# Patient Record
Sex: Male | Born: 1965 | Race: Black or African American | Hispanic: No | Smoking: Never smoker
Health system: Southern US, Community
[De-identification: ages and names within clinical notes are randomized; demographics above are authoritative.]

## PROBLEM LIST (undated history)

## (undated) DIAGNOSIS — I10 Essential (primary) hypertension: Secondary | ICD-10-CM

## (undated) DIAGNOSIS — L309 Dermatitis, unspecified: Secondary | ICD-10-CM

## (undated) DIAGNOSIS — W3400XA Accidental discharge from unspecified firearms or gun, initial encounter: Secondary | ICD-10-CM

## (undated) DIAGNOSIS — T7840XA Allergy, unspecified, initial encounter: Secondary | ICD-10-CM

## (undated) DIAGNOSIS — Z5189 Encounter for other specified aftercare: Secondary | ICD-10-CM

## (undated) HISTORY — PX: TONSILLECTOMY: SUR1361

## (undated) HISTORY — PX: MYRINGOTOMY: SUR874

## (undated) HISTORY — DX: Allergy, unspecified, initial encounter: T78.40XA

## (undated) HISTORY — PX: UPPER GASTROINTESTINAL ENDOSCOPY: SHX188

## (undated) HISTORY — DX: Encounter for other specified aftercare: Z51.89

## (undated) HISTORY — PX: COLOSTOMY: SHX63

## (undated) HISTORY — DX: Dermatitis, unspecified: L30.9

## (undated) HISTORY — DX: Essential (primary) hypertension: I10

## (undated) HISTORY — DX: Accidental discharge from unspecified firearms or gun, initial encounter: W34.00XA

---

## 2004-11-21 ENCOUNTER — Ambulatory Visit: Payer: Self-pay | Admitting: Internal Medicine

## 2004-12-12 ENCOUNTER — Ambulatory Visit: Payer: Self-pay | Admitting: Internal Medicine

## 2005-01-06 ENCOUNTER — Inpatient Hospital Stay (HOSPITAL_COMMUNITY): Admission: EM | Admit: 2005-01-06 | Discharge: 2005-01-17 | Payer: Self-pay

## 2005-01-30 ENCOUNTER — Emergency Department (HOSPITAL_COMMUNITY): Admission: EM | Admit: 2005-01-30 | Discharge: 2005-01-30 | Payer: Self-pay | Admitting: *Deleted

## 2005-02-02 ENCOUNTER — Emergency Department (HOSPITAL_COMMUNITY): Admission: EM | Admit: 2005-02-02 | Discharge: 2005-02-03 | Payer: Self-pay | Admitting: Emergency Medicine

## 2005-02-10 ENCOUNTER — Ambulatory Visit: Payer: Self-pay | Admitting: Internal Medicine

## 2005-02-13 ENCOUNTER — Ambulatory Visit (HOSPITAL_COMMUNITY): Admission: RE | Admit: 2005-02-13 | Discharge: 2005-02-13 | Payer: Self-pay | Admitting: Internal Medicine

## 2005-02-17 ENCOUNTER — Ambulatory Visit (HOSPITAL_COMMUNITY): Admission: RE | Admit: 2005-02-17 | Discharge: 2005-02-17 | Payer: Self-pay | Admitting: *Deleted

## 2005-02-17 ENCOUNTER — Ambulatory Visit (HOSPITAL_BASED_OUTPATIENT_CLINIC_OR_DEPARTMENT_OTHER): Admission: RE | Admit: 2005-02-17 | Discharge: 2005-02-17 | Payer: Self-pay | Admitting: *Deleted

## 2005-03-13 ENCOUNTER — Ambulatory Visit: Payer: Self-pay | Admitting: Internal Medicine

## 2005-03-17 ENCOUNTER — Encounter: Admission: RE | Admit: 2005-03-17 | Discharge: 2005-04-13 | Payer: Self-pay | Admitting: Internal Medicine

## 2005-04-21 ENCOUNTER — Ambulatory Visit: Payer: Self-pay | Admitting: Internal Medicine

## 2005-05-18 ENCOUNTER — Inpatient Hospital Stay (HOSPITAL_COMMUNITY): Admission: RE | Admit: 2005-05-18 | Discharge: 2005-05-21 | Payer: Self-pay | Admitting: General Surgery

## 2005-07-25 ENCOUNTER — Ambulatory Visit: Payer: Self-pay | Admitting: Internal Medicine

## 2005-09-13 ENCOUNTER — Ambulatory Visit: Payer: Self-pay | Admitting: Internal Medicine

## 2005-10-13 ENCOUNTER — Ambulatory Visit (HOSPITAL_COMMUNITY): Admission: RE | Admit: 2005-10-13 | Discharge: 2005-10-13 | Payer: Self-pay | Admitting: General Surgery

## 2005-11-02 ENCOUNTER — Inpatient Hospital Stay (HOSPITAL_COMMUNITY): Admission: RE | Admit: 2005-11-02 | Discharge: 2005-11-05 | Payer: Self-pay | Admitting: General Surgery

## 2005-11-23 ENCOUNTER — Ambulatory Visit: Payer: Self-pay | Admitting: Internal Medicine

## 2005-12-04 ENCOUNTER — Encounter: Admission: RE | Admit: 2005-12-04 | Discharge: 2005-12-04 | Payer: Self-pay | Admitting: General Surgery

## 2005-12-19 ENCOUNTER — Ambulatory Visit: Payer: Self-pay | Admitting: Internal Medicine

## 2006-03-20 ENCOUNTER — Ambulatory Visit: Payer: Self-pay | Admitting: Internal Medicine

## 2006-08-07 ENCOUNTER — Encounter: Admission: RE | Admit: 2006-08-07 | Discharge: 2006-08-07 | Payer: Self-pay | Admitting: General Surgery

## 2006-08-08 ENCOUNTER — Inpatient Hospital Stay (HOSPITAL_COMMUNITY): Admission: AD | Admit: 2006-08-08 | Discharge: 2006-08-12 | Payer: Self-pay | Admitting: General Surgery

## 2006-08-09 ENCOUNTER — Encounter (INDEPENDENT_AMBULATORY_CARE_PROVIDER_SITE_OTHER): Payer: Self-pay | Admitting: Specialist

## 2006-12-10 ENCOUNTER — Ambulatory Visit: Payer: Self-pay | Admitting: Internal Medicine

## 2007-03-23 ENCOUNTER — Encounter: Payer: Self-pay | Admitting: Internal Medicine

## 2007-03-23 DIAGNOSIS — E1165 Type 2 diabetes mellitus with hyperglycemia: Secondary | ICD-10-CM

## 2007-03-23 DIAGNOSIS — K432 Incisional hernia without obstruction or gangrene: Secondary | ICD-10-CM | POA: Insufficient documentation

## 2007-03-23 DIAGNOSIS — I10 Essential (primary) hypertension: Secondary | ICD-10-CM | POA: Insufficient documentation

## 2007-03-23 DIAGNOSIS — E669 Obesity, unspecified: Secondary | ICD-10-CM | POA: Insufficient documentation

## 2007-03-23 DIAGNOSIS — E119 Type 2 diabetes mellitus without complications: Secondary | ICD-10-CM | POA: Insufficient documentation

## 2007-04-24 ENCOUNTER — Ambulatory Visit: Payer: Self-pay | Admitting: Internal Medicine

## 2007-04-24 LAB — CONVERTED CEMR LAB
Basophils Absolute: 0 10*3/uL (ref 0.0–0.1)
Bilirubin, Direct: 0.1 mg/dL (ref 0.0–0.3)
Eosinophils Absolute: 0 10*3/uL (ref 0.0–0.6)
Eosinophils Relative: 0.9 % (ref 0.0–5.0)
GFR calc Af Amer: 96 mL/min
Glucose, Bld: 122 mg/dL — ABNORMAL HIGH (ref 70–99)
HCT: 40.5 % (ref 39.0–52.0)
Hgb A1c MFr Bld: 6.4 % — ABNORMAL HIGH (ref 4.6–6.0)
Lymphocytes Relative: 52.9 % — ABNORMAL HIGH (ref 12.0–46.0)
MCHC: 34 g/dL (ref 30.0–36.0)
MCV: 93.5 fL (ref 78.0–100.0)
Monocytes Absolute: 0.5 10*3/uL (ref 0.2–0.7)
Neutro Abs: 1.8 10*3/uL (ref 1.4–7.7)
Neutrophils Relative %: 36.3 % — ABNORMAL LOW (ref 43.0–77.0)
Potassium: 4.4 meq/L (ref 3.5–5.1)
RBC: 4.33 M/uL (ref 4.22–5.81)
Sodium: 145 meq/L (ref 135–145)
Total Protein: 8.4 g/dL — ABNORMAL HIGH (ref 6.0–8.3)
WBC: 5 10*3/uL (ref 4.5–10.5)

## 2007-11-11 ENCOUNTER — Telehealth: Payer: Self-pay | Admitting: Internal Medicine

## 2008-03-25 ENCOUNTER — Telehealth: Payer: Self-pay | Admitting: Internal Medicine

## 2008-03-26 ENCOUNTER — Ambulatory Visit: Payer: Self-pay | Admitting: Internal Medicine

## 2008-03-26 DIAGNOSIS — J302 Other seasonal allergic rhinitis: Secondary | ICD-10-CM | POA: Insufficient documentation

## 2008-03-26 DIAGNOSIS — J3089 Other allergic rhinitis: Secondary | ICD-10-CM

## 2008-05-14 ENCOUNTER — Telehealth (INDEPENDENT_AMBULATORY_CARE_PROVIDER_SITE_OTHER): Payer: Self-pay | Admitting: *Deleted

## 2008-05-29 ENCOUNTER — Ambulatory Visit: Payer: Self-pay | Admitting: Internal Medicine

## 2010-05-30 ENCOUNTER — Ambulatory Visit: Payer: Self-pay | Admitting: Internal Medicine

## 2010-05-30 DIAGNOSIS — M79609 Pain in unspecified limb: Secondary | ICD-10-CM | POA: Insufficient documentation

## 2010-05-30 DIAGNOSIS — R209 Unspecified disturbances of skin sensation: Secondary | ICD-10-CM | POA: Insufficient documentation

## 2010-06-01 LAB — CONVERTED CEMR LAB
AST: 28 units/L (ref 0–37)
Albumin: 4.5 g/dL (ref 3.5–5.2)
Alkaline Phosphatase: 48 units/L (ref 39–117)
Basophils Absolute: 0 10*3/uL (ref 0.0–0.1)
Bilirubin Urine: NEGATIVE
CO2: 33 meq/L — ABNORMAL HIGH (ref 19–32)
Calcium: 9.9 mg/dL (ref 8.4–10.5)
Creatinine, Ser: 1.2 mg/dL (ref 0.4–1.5)
Creatinine,U: 352.2 mg/dL
GFR calc non Af Amer: 89.4 mL/min (ref >60)
Glucose, Bld: 195 mg/dL — ABNORMAL HIGH (ref 70–99)
Ketones, ur: NEGATIVE mg/dL
Leukocytes, UA: NEGATIVE
Lymphocytes Relative: 55.8 % — ABNORMAL HIGH (ref 12.0–46.0)
MCHC: 34.6 g/dL (ref 30.0–36.0)
MCV: 93.3 fL (ref 78.0–100.0)
Microalb, Ur: 4.4 mg/dL — ABNORMAL HIGH (ref 0.0–1.9)
Monocytes Relative: 6.2 % (ref 3.0–12.0)
Platelets: 195 10*3/uL (ref 150.0–400.0)
RDW: 12.9 % (ref 11.5–14.6)
Sed Rate: 23 mm/hr — ABNORMAL HIGH (ref 0–22)
TSH: 0.58 microintl units/mL (ref 0.35–5.50)
Total Bilirubin: 1 mg/dL (ref 0.3–1.2)
Urine Glucose: NEGATIVE mg/dL
pH: 5 (ref 5.0–8.0)

## 2010-06-23 ENCOUNTER — Encounter: Payer: Self-pay | Admitting: Internal Medicine
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
## 2010-09-27 NOTE — Consult Note (Signed)
Summary: [facility]  [facility]   Imported By: Lennie Odor 07/13/2010 14:54:13  _____________________________________________________________________  External Attachment:    Type:   Image     Comment:   External Document

## 2010-11-03 ENCOUNTER — Ambulatory Visit: Payer: Self-pay | Admitting: Family Medicine

## 2010-11-04 ENCOUNTER — Other Ambulatory Visit: Payer: Self-pay | Admitting: Family Medicine

## 2010-11-04 ENCOUNTER — Ambulatory Visit (INDEPENDENT_AMBULATORY_CARE_PROVIDER_SITE_OTHER): Payer: 59 | Admitting: Family Medicine

## 2010-11-04 ENCOUNTER — Ambulatory Visit (HOSPITAL_BASED_OUTPATIENT_CLINIC_OR_DEPARTMENT_OTHER)
Admission: RE | Admit: 2010-11-04 | Discharge: 2010-11-04 | Disposition: A | Discharge status: Home / Self Care | Payer: 59 | Source: Ambulatory Visit | Attending: Family Medicine | Admitting: Family Medicine

## 2010-11-04 ENCOUNTER — Ambulatory Visit: Payer: Self-pay | Admitting: Family Medicine

## 2010-11-04 ENCOUNTER — Encounter: Payer: Self-pay | Admitting: Family Medicine

## 2010-11-04 DIAGNOSIS — M79609 Pain in unspecified limb: Secondary | ICD-10-CM

## 2010-11-04 DIAGNOSIS — M25569 Pain in unspecified knee: Secondary | ICD-10-CM | POA: Insufficient documentation

## 2010-11-10 ENCOUNTER — Ambulatory Visit: Payer: 59 | Admitting: Family Medicine

## 2010-11-11 ENCOUNTER — Encounter: Payer: Self-pay | Admitting: Family Medicine

## 2010-11-11 ENCOUNTER — Ambulatory Visit: Payer: 59 | Attending: Family Medicine | Admitting: Physical Therapy

## 2010-11-11 ENCOUNTER — Ambulatory Visit (INDEPENDENT_AMBULATORY_CARE_PROVIDER_SITE_OTHER): Payer: 59 | Admitting: Family Medicine

## 2010-11-11 DIAGNOSIS — M25569 Pain in unspecified knee: Secondary | ICD-10-CM

## 2010-11-11 DIAGNOSIS — M25559 Pain in unspecified hip: Secondary | ICD-10-CM | POA: Insufficient documentation

## 2010-11-11 DIAGNOSIS — IMO0001 Reserved for inherently not codable concepts without codable children: Secondary | ICD-10-CM | POA: Insufficient documentation
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
## 2010-11-15 NOTE — Assessment & Plan Note (Signed)
Summary: F/U/LP # CELL  [phone]   Primary Care Provider:  Tresa Garter MD   History of Present Illness: 45 yo M returns today for aspiration and injection of left knee  H/o football injury in 1986 with resultant left knee infection/effusion requiring surgery.   Had a second injury when in college but this did not require surgery Believes he had MRI at the time without evidence of ligamentous or meniscal injury but was very remote. Has swelling since then No true catching, locking, giving out Pain anterior and lateral in left knee. X-rays showed mild DJD Discussed with patient symptoms and exam most likely indicate DJD vs degen meniscal tear - would go ahead with aspiration and injection today following from his appointment last week.  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 1 By Mouth Once Daily For Blood Pressure 2)  Metformin Hcl 500 Mg Tabs (Metformin Hcl) .Marland Kitchen.. 1 By Mouth Two Times A Day For Diabetes 3)  Vitamin D3 1000 Unit  Tabs (Cholecalciferol) .Marland Kitchen.. 1 By Mouth Daily 4)  Multivitamin .... Once Daily  Allergies (verified): 1)  ! Diovan 2)  ! Ace Inhibitors 3)  ! * Shellfish  Physical Exam  General:  alert, well-developed, well-nourished, and well-hydrated, overweight Msk:  Exam unchanged   Impression & Recommendations:  Problem # 1:  KNEE PAIN, LEFT (ICD-719.46) Assessment Unchanged  DJD vs Degen meniscal tear.  After informed written consent patient was lying supine on the exam table and left knee was prepped with iodine and alcohol swab.  Utilizing a superolateral  approach, 3mL marcaine used for local.  Then left knee aspirated - 30 mL clear straw colored fluid aspirated.  Left knee was then injected with 3:1 marcaine:depomedrol.  Patient tolerated the procedure well without any immediate complications.  Orders: No Charge Patient Arrived (NCPA0) (NCPA0) Joint Aspirate / Injection, Large (20610)  Complete Medication List: 1)  Losartan Potassium 100 Mg Tabs (Losartan potassium) .Marland Kitchen.. 1 by mouth once daily for blood pressure 2)  Metformin Hcl 500 Mg Tabs (Metformin hcl) .Marland Kitchen.. 1 by mouth two times a day for diabetes 3)  Vitamin D3 1000 Unit Tabs (Cholecalciferol) .Marland Kitchen.. 1 by mouth daily 4)  Multivitamin  .... Once daily   Orders Added: 1)  No Charge Patient Arrived (NCPA0) [NCPA0] 2)  Joint Aspirate / Injection, Large [20610]

## 2010-11-25 ENCOUNTER — Ambulatory Visit: Payer: 59 | Admitting: Physical Therapy

## 2010-11-29 ENCOUNTER — Ambulatory Visit: Payer: 59 | Attending: Family Medicine | Admitting: Physical Therapy

## 2010-11-29 DIAGNOSIS — M25559 Pain in unspecified hip: Secondary | ICD-10-CM | POA: Insufficient documentation

## 2010-11-29 DIAGNOSIS — IMO0001 Reserved for inherently not codable concepts without codable children: Secondary | ICD-10-CM | POA: Insufficient documentation

## 2010-12-20 ENCOUNTER — Ambulatory Visit: Payer: 59 | Admitting: Physical Therapy

## 2010-12-21 ENCOUNTER — Other Ambulatory Visit (INDEPENDENT_AMBULATORY_CARE_PROVIDER_SITE_OTHER): Payer: 59

## 2010-12-21 ENCOUNTER — Encounter: Payer: Self-pay | Admitting: Internal Medicine

## 2010-12-21 ENCOUNTER — Ambulatory Visit (INDEPENDENT_AMBULATORY_CARE_PROVIDER_SITE_OTHER): Payer: 59 | Admitting: Internal Medicine

## 2010-12-21 DIAGNOSIS — E119 Type 2 diabetes mellitus without complications: Secondary | ICD-10-CM

## 2010-12-21 DIAGNOSIS — R29898 Other symptoms and signs involving the musculoskeletal system: Secondary | ICD-10-CM

## 2010-12-21 DIAGNOSIS — M79609 Pain in unspecified limb: Secondary | ICD-10-CM

## 2010-12-21 DIAGNOSIS — R209 Unspecified disturbances of skin sensation: Secondary | ICD-10-CM

## 2010-12-21 DIAGNOSIS — M79606 Pain in leg, unspecified: Secondary | ICD-10-CM

## 2010-12-21 DIAGNOSIS — I10 Essential (primary) hypertension: Secondary | ICD-10-CM

## 2010-12-21 DIAGNOSIS — R202 Paresthesia of skin: Secondary | ICD-10-CM

## 2010-12-21 DIAGNOSIS — S7410XA Injury of femoral nerve at hip and thigh level, unspecified leg, initial encounter: Secondary | ICD-10-CM

## 2010-12-21 LAB — COMPREHENSIVE METABOLIC PANEL
ALT: 16 U/L (ref 0–53)
CO2: 31 mEq/L (ref 19–32)
Calcium: 9.8 mg/dL (ref 8.4–10.5)
Chloride: 103 mEq/L (ref 96–112)
Creatinine, Ser: 1.1 mg/dL (ref 0.4–1.5)
GFR: 94.85 mL/min (ref >60.00)
Glucose, Bld: 151 mg/dL — ABNORMAL HIGH (ref 70–99)
Total Bilirubin: 0.8 mg/dL (ref 0.3–1.2)
Total Protein: 7.6 g/dL (ref 6.0–8.3)

## 2010-12-21 LAB — CBC WITH DIFFERENTIAL/PLATELET
Basophils Absolute: 0 10*3/uL (ref 0.0–0.1)
Eosinophils Relative: 0.5 % (ref 0.0–5.0)
HCT: 40.7 % (ref 39.0–52.0)
Hemoglobin: 13.9 g/dL (ref 13.0–17.0)
Lymphocytes Relative: 53.7 % — ABNORMAL HIGH (ref 12.0–46.0)
Lymphs Abs: 2.8 10*3/uL (ref 0.7–4.0)
Monocytes Relative: 8 % (ref 3.0–12.0)
Platelets: 191 10*3/uL (ref 150.0–400.0)
RDW: 12.8 % (ref 11.5–14.6)
WBC: 5.3 10*3/uL (ref 4.5–10.5)

## 2010-12-21 MED ORDER — MELOXICAM 15 MG PO TABS: 15.0000 mg | ORAL_TABLET | Freq: Every day | ORAL | Status: AC | PRN

## 2010-12-21 NOTE — Progress Notes (Signed)
  Subjective:    Patient ID: [name], male    DOB: 08/24/1966, 45 y.o.   MRN: [id number]  HPI   C/o R thigh pain and weakness in R leg - it would give out. It hurts at night and all day long. The pain is off and on.  The pain is in the front of R thigh.No LBP. He has a pulling sensation in R thigh. It "catches" - and it would go away in a couple seconds. It has gotten worse lately. He had seen Dr Pearletha Forge (Sports Med) and was started on PT.  Review of Systems  Constitutional: Negative for appetite change, fatigue and unexpected weight change.  HENT: Negative for nosebleeds, congestion, sore throat, sneezing, trouble swallowing and neck pain.   Eyes: Negative for itching and visual disturbance.  Respiratory: Negative for cough.   Cardiovascular: Negative for chest pain, palpitations and leg swelling.  Gastrointestinal: Negative for nausea, diarrhea, blood in stool and abdominal distention.  Genitourinary: Negative for frequency and hematuria.  Musculoskeletal: Positive for gait problem (R knee gives out). Negative for back pain and joint swelling.  Skin: Negative for rash.  Neurological: Negative for dizziness, tremors, speech difficulty and weakness.  Psychiatric/Behavioral: Negative for sleep disturbance, dysphoric mood and agitation. The patient is not nervous/anxious.        Objective:   Physical Exam  Constitutional: He is oriented to person, place, and time. He appears well-developed.  HENT:  Mouth/Throat: Oropharynx is clear and moist.  Eyes: Conjunctivae are normal. Pupils are equal, round, and reactive to light.  Neck: Normal range of motion. No JVD present. No thyromegaly present.  Cardiovascular: Normal rate, regular rhythm, normal heart sounds and intact distal pulses.  Exam reveals no gallop and no friction rub.   No murmur heard. Pulmonary/Chest: Effort normal and breath sounds normal. No respiratory distress. He has no wheezes. He has no rales. He exhibits no  tenderness.  Abdominal: Soft. Bowel sounds are normal. He exhibits no distension and no mass. There is no tenderness. There is no rebound and no guarding.  Musculoskeletal: Normal range of motion. He exhibits no edema and no tenderness.       R quad and biceps are atrophic and weaker  Lymphadenopathy:    He has no cervical adenopathy.  Neurological: He is alert and oriented to person, place, and time. He has normal reflexes. No cranial nerve deficit. He exhibits normal muscle tone. Coordination normal.  Skin: Skin is warm and dry. No rash noted.  Psychiatric: He has a normal mood and affect. His behavior is normal. Judgment and thought content normal.          Assessment & Plan:   Femoral nerve injury Ortho consult.   HYPERTENSION BP Readings from Last 3 Encounters:  12/21/10 140/90  05/30/10 130/90  05/29/08 120/82  May need to adjust meds   DIABETES MELLITUS, TYPE II On current  Rx - poor control. See meds. Compliance discussed  Lab Results  Component Value Date   HGBA1C 9.1* 12/21/2010

## 2010-12-22 ENCOUNTER — Telehealth: Payer: Self-pay | Admitting: Internal Medicine

## 2010-12-22 LAB — VITAMIN D 25 HYDROXY (VIT D DEFICIENCY, FRACTURES): Vit D, 25-Hydroxy: 17 ng/mL — ABNORMAL LOW (ref 30–89)

## 2010-12-22 MED ORDER — VITAMIN D3 1.25 MG (50000 UT) PO CAPS: 1.0000 | ORAL_CAPSULE | ORAL | Status: DC

## 2010-12-22 MED ORDER — VITAMIN D 1000 UNITS PO TABS: 1000.0000 [IU] | ORAL_TABLET | Freq: Every day | ORAL | Status: DC

## 2010-12-22 MED ORDER — VITAMIN B-12 1000 MCG PO TABS: 1000.0000 ug | ORAL_TABLET | Freq: Every day | ORAL | Status: DC

## 2010-12-22 MED ORDER — METFORMIN HCL 500 MG PO TABS: 1000.0000 mg | ORAL_TABLET | Freq: Two times a day (BID) | ORAL | Status: DC

## 2010-12-22 NOTE — Telephone Encounter (Signed)
[name], please, inform patient that all labs are normal except for high sugars, low Vit D and low Vit B12. Increase Metformin to 2 tab bid Start B12 Restart Vit D RX and OTC   Thx

## 2010-12-24 NOTE — Telephone Encounter (Signed)
Pt informed

## 2010-12-28 ENCOUNTER — Ambulatory Visit: Payer: 59 | Admitting: Physical Therapy

## 2010-12-30 DIAGNOSIS — S7410XA Injury of femoral nerve at hip and thigh level, unspecified leg, initial encounter: Secondary | ICD-10-CM | POA: Insufficient documentation

## 2010-12-30 NOTE — Assessment & Plan Note (Signed)
On current  Rx - poor control. See meds. Compliance discussed  Lab Results  Component Value Date   HGBA1C 9.1* 12/21/2010

## 2010-12-30 NOTE — Assessment & Plan Note (Signed)
Ortho consult

## 2010-12-30 NOTE — Assessment & Plan Note (Signed)
BP Readings from Last 3 Encounters:  12/21/10 140/90  05/30/10 130/90  05/29/08 120/82  May need to adjust meds

## 2011-01-05 ENCOUNTER — Ambulatory Visit: Payer: 59 | Attending: Family Medicine | Admitting: Physical Therapy

## 2011-01-05 DIAGNOSIS — M25559 Pain in unspecified hip: Secondary | ICD-10-CM | POA: Insufficient documentation

## 2011-01-05 DIAGNOSIS — IMO0001 Reserved for inherently not codable concepts without codable children: Secondary | ICD-10-CM | POA: Insufficient documentation
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
## 2011-01-13 NOTE — Op Note (Signed)
NAMERHYDIAN, BALDI NO.:  [id number]   MEDICAL RECORD NO.:  [id number]          PATIENT TYPE:  INP   LOCATION:  5714                         FACILITY:  [facility]   PHYSICIAN:  Cherylynn Ridges, M.D.    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   POSTOPERATIVE DIAGNOSIS:  Abdominal wall abscess, no enterocutaneous  fistula identified.   PROCEDURE:  1. Exploratory laparotomy.  2. Incision and drainage of abdominal wall abscess.  3. Removal of abdominal wall mesh.  4. Lysis of adhesions.   SURGEON:  Cherylynn Ridges, M.D.   ASSISTANT:  Wilmon Arms. Tsuei, M.D.   ANESTHESIA:  General endotracheal.   ESTIMATED BLOOD LOSS:  150 to 200 mL.   COMPLICATIONS:  None.   CONDITION:  Stable.   FINDINGS:  The patient had a mucopurulent abdominal wall abscess under  the umbilicus with no associated succus entericus identified.  The  specimen sent for aerobic and anaerobic cultures.   INDICATIONS FOR OPERATION:  The patient is a 45 year old who had a  previous Proceed mesh ventral hernia repair, who developed an acute  onset of periumbilical swelling, redness and tenderness.  CT scan  demonstrated wall abscess with possibility of the fistula and he was  taken to surgery for operation.   OPERATION:  The patient was taken to the operating room, placed on table  in supine position.  After an adequate general anesthetic was  administered he was prepped and draped in usual sterile manner exposing  midline.   We made the incision through the previous old incision from above and  below the umbilicus down next to this swollen area in the periumbilical  area.  We did some dissection with a knife and some with electrocautery.  We were eventually able to identify this abscess cavity by cutting into  it sort  of diagonally using electrocautery.  The whole sac was opened  up, we took out a large amount of pus.  Aerobic and anaerobic cultures  were sent.  The culture was not foul-smelling.  We debrided most of the  necrotic tissue in the abscess cavity.  Then we dissected down to the  fascia where several fascial openings could be seen with some  mucopurulent drainage and particulate matter was debrided from the wound  itself.   We followed this mucopurulent drainage down below the fascia.  We were  able to get into below the fascia and subsequently dissect out the  fascia circumferentially where multiple large pieces of old mesh were  encountered and resected.  Tacking stitches from putting in the mesh  were also removed.  There was severe adhesions of the bowel to the  abdominal wall.  However, no identifiable enterocutaneous fistula was  found and needed to be repaired.   After we had circumferentially released all the bowel from the wound, we  irrigated with copious amounts of saline.  We packed the wound with  a  dry clean gauze and no bile staining was noted.  No enterotomies were  made during this extensive adhesiolysis.  We subsequently closed using  interrupted #1 Novofils simple stitches.  We closed upper and lower  portion of the incision but left the periumbilical area open for packing  with wet-to-dry dressing and normal saline.  A sterile dressing was  applied.  All counts were correct.      Cherylynn Ridges, M.D.  Electronically Signed     JOW/MEDQ  D:  08/09/2006  T:  08/09/2006  Job:  [id number]

## 2011-01-13 NOTE — Op Note (Signed)
NAME:  [name], [name] NO.:  [id number]   MEDICAL RECORD NO.:  [id number]          PATIENT TYPE:  AMB   LOCATION:  DSC                          FACILITY:  [facility]   PHYSICIAN:  Tennis Must Meyerdierks, M.D.DATE OF BIRTH:  [birth year]   DATE OF PROCEDURE:  02/17/2005  DATE OF DISCHARGE:                                 OPERATIVE REPORT   PREOPERATIVE DIAGNOSIS:  Status post external fixation, left first  metacarpal.   POSTOPERATIVE DIAGNOSIS:  Status post external fixation, left first  metacarpal.   PROCEDURE:  Removal of external fixator, left thumb.   SURGEON:  Lowell Bouton, M.D.   ANESTHESIA:  0.5% Marcaine local with sedation.   OPERATIVE FINDINGS:  The patient had some consolidation of the fracture  under fluoroscopy.  However, there was still motion at the fracture site.   PROCEDURE:  Under 0.5% Marcaine local anesthesia, the left hand pin sites  were cleaned and a wrench was used to remove the external portion of the  fixator.  Four pins were then removed with a wrench after inserting a  0.5%  Marcaine and the pin tracts.  Under fluoroscopy, the fracture was then  examined and was still found to have some motion.  Betadine ointment was  applied to the wounds and sterile dressing followed by a thumb spica splint.  The patient tolerated the procedure well and went to the recovery room awake  and stable, in good condition.       EMM/MEDQ  D:  02/17/2005  T:  02/18/2005  Job:  [id number]
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
## 2011-01-13 NOTE — Discharge Summary (Signed)
NAMEARDA, KEADLE NO.:  [id number]   MEDICAL RECORD NO.:  [id number]          PATIENT TYPE:  INP   LOCATION:  5714                         FACILITY:  [facility]   PHYSICIAN:  Cherylynn Ridges, M.D.    DATE OF BIRTH:  [birth year]   DATE OF ADMISSION:  08/08/2006  DATE OF DISCHARGE:  08/12/2006                               DISCHARGE SUMMARY   DISCHARGE DIAGNOSIS:  Abdominal ventral hernia mesh infection.   PROCEDURE:  Exploratory laparotomy, drainage of possible intracutaneous  fistula and removal of abdominal wall mesh.   SURGEON:  Dr. Lindie Spruce   DISCHARGE MEDICATIONS:  He was discharged home on:  1. Septra DS to be taken as needed.  2. Vicodin for pain.   FOLLOWUP:  He is to return to see Dr. Lindie Spruce in 1-2 weeks.   DIET:  His diet on discharge is regular.   CONDITION ON DISCHARGE:  Stable.   DISCHARGE INSTRUCTIONS:  He was getting home health care for wet-to-dry  dressings to be done twice a day.   BRIEF SUMMARY OF HOSPITAL COURSE:  The patient had developed  periumbilical and abdominal wall infection, known to have had a ventral  hernia repair with mesh in place.  CT scan suggested an enterocutaneous  fistula and he was admitted for exploration and repair.  At the time of  surgery the patient was found to have infected mesh with no evidence of  any enterocutaneous fistula.  He was explored.  The mesh was removed.  The wall was repaired without mesh and he was subsequently left no open  wound with wet-to-dry dressings and an abdominal wall binder to maintain  integrity.  He was discharged home once his diet had improved.  He was  passing gas well, ambulating without difficulty.  He is to return to see  Dr. Lindie Spruce in 1-2 weeks.      Cherylynn Ridges, M.D.  Electronically Signed     JOW/MEDQ  D:  09/20/2006  T:  09/20/2006  Job:  [id number]

## 2011-04-07 ENCOUNTER — Telehealth: Payer: Self-pay | Admitting: *Deleted

## 2011-04-07 DIAGNOSIS — M5416 Radiculopathy, lumbar region: Secondary | ICD-10-CM

## 2011-04-07 NOTE — Telephone Encounter (Signed)
Pt had nerve conduction and EMG. 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 Pt leaving the country next week and would like this done on Monday. Please Advise

## 2011-04-07 NOTE — Telephone Encounter (Signed)
I'm not sure I understand. Please, re-phrase. Thx
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
## 2011-04-10 NOTE — Telephone Encounter (Signed)
Pt wants MRI done before he goes out of town. Sorry
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
## 2011-04-11 ENCOUNTER — Telehealth (INDEPENDENT_AMBULATORY_CARE_PROVIDER_SITE_OTHER): Payer: Self-pay | Admitting: General Surgery
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
## 2011-04-12 ENCOUNTER — Other Ambulatory Visit: Payer: Self-pay | Admitting: *Deleted

## 2011-04-12 DIAGNOSIS — W3400XA Accidental discharge from unspecified firearms or gun, initial encounter: Secondary | ICD-10-CM

## 2011-04-12 DIAGNOSIS — M5416 Radiculopathy, lumbar region: Secondary | ICD-10-CM

## 2011-04-12 NOTE — Telephone Encounter (Signed)
MRI order changed to w/wo contrast.

## 2011-04-22 ENCOUNTER — Other Ambulatory Visit (HOSPITAL_COMMUNITY): Payer: Self-pay | Admitting: Radiology

## 2011-04-22 ENCOUNTER — Ambulatory Visit (HOSPITAL_COMMUNITY)
Admission: RE | Admit: 2011-04-22 | Discharge: 2011-04-22 | Disposition: A | Discharge status: Home / Self Care | Payer: 59 | Source: Ambulatory Visit | Attending: Radiology | Admitting: Radiology

## 2011-04-22 ENCOUNTER — Ambulatory Visit (HOSPITAL_COMMUNITY)
Admission: RE | Admit: 2011-04-22 | Discharge: 2011-04-22 | Disposition: A | Discharge status: Home / Self Care | Payer: 59 | Source: Ambulatory Visit | Attending: Internal Medicine | Admitting: Internal Medicine

## 2011-04-22 ENCOUNTER — Other Ambulatory Visit: Payer: Self-pay | Admitting: *Deleted

## 2011-04-22 DIAGNOSIS — M5416 Radiculopathy, lumbar region: Secondary | ICD-10-CM

## 2011-04-22 DIAGNOSIS — Z1810 Retained metal fragments, unspecified: Secondary | ICD-10-CM

## 2011-04-22 DIAGNOSIS — M129 Arthropathy, unspecified: Secondary | ICD-10-CM | POA: Insufficient documentation

## 2011-04-22 DIAGNOSIS — I998 Other disorder of circulatory system: Secondary | ICD-10-CM | POA: Insufficient documentation

## 2011-04-22 DIAGNOSIS — M79609 Pain in unspecified limb: Secondary | ICD-10-CM | POA: Insufficient documentation

## 2011-04-22 LAB — CREATININE, SERUM
Creatinine, Ser: 1.1 mg/dL (ref 0.50–1.35)
GFR calc non Af Amer: >60 mL/min (ref >60)

## 2011-04-22 MED ORDER — GADOBENATE DIMEGLUMINE 529 MG/ML IV SOLN
20.0000 mL | Freq: Once | INTRAVENOUS | Status: AC | PRN
  Administered 2011-04-22: 20 mL via INTRAVENOUS

## 2011-04-24 ENCOUNTER — Telehealth: Payer: Self-pay | Admitting: *Deleted

## 2011-04-24 NOTE — Telephone Encounter (Signed)
Patient requesting results of MRI.

## 2011-04-24 NOTE — Telephone Encounter (Signed)
Sarah, please, mail him a copy. I called him. The MRI was ok except for some mild disk bulging, not responsible for his sx's of femoral nerve damage on R. Thx

## 2011-04-25 NOTE — Telephone Encounter (Signed)
MRI report mailed to home address on file per pt request.

## 2011-04-27 ENCOUNTER — Telehealth: Payer: Self-pay | Admitting: Internal Medicine

## 2011-04-27 DIAGNOSIS — G5720 Lesion of femoral nerve, unspecified lower limb: Secondary | ICD-10-CM

## 2011-04-27 NOTE — Telephone Encounter (Signed)
Pt informed. Yes, ok to schedule. Pt prefers next [place] or Fri as he will not be in town until then.

## 2011-04-27 NOTE — Telephone Encounter (Signed)
[name], please, inform patient that I was researching the issue: we can ref him to see a Neurology Dept doctor at [facility] for a consult. ?OK to sch? Thx

## 2011-04-28 NOTE — Telephone Encounter (Signed)
. (  Rehab Med) consult at [facility]) with Genice Rouge, M.D - order signed Thx

## 2011-06-02 ENCOUNTER — Other Ambulatory Visit: Payer: Self-pay | Admitting: Internal Medicine

## 2011-10-25 ENCOUNTER — Telehealth: Payer: Self-pay | Admitting: Internal Medicine

## 2011-10-25 NOTE — Telephone Encounter (Signed)
The pt called and is hoping to be seen for fatigue tomorrow.  He was offered several appointments with other drs, but refused.  Please advise if you want him worked in.     Thanks!

## 2011-10-25 NOTE — Telephone Encounter (Signed)
Ok to work in thx

## 2011-10-27 ENCOUNTER — Encounter: Payer: Self-pay | Admitting: Internal Medicine

## 2011-10-27 ENCOUNTER — Other Ambulatory Visit (INDEPENDENT_AMBULATORY_CARE_PROVIDER_SITE_OTHER): Payer: 59

## 2011-10-27 ENCOUNTER — Ambulatory Visit (INDEPENDENT_AMBULATORY_CARE_PROVIDER_SITE_OTHER): Payer: 59 | Admitting: Internal Medicine

## 2011-10-27 DIAGNOSIS — R209 Unspecified disturbances of skin sensation: Secondary | ICD-10-CM

## 2011-10-27 DIAGNOSIS — I10 Essential (primary) hypertension: Secondary | ICD-10-CM

## 2011-10-27 DIAGNOSIS — E119 Type 2 diabetes mellitus without complications: Secondary | ICD-10-CM

## 2011-10-27 LAB — BASIC METABOLIC PANEL
BUN: 12 mg/dL (ref 6–23)
CO2: 29 mEq/L (ref 19–32)
Calcium: 9.7 mg/dL (ref 8.4–10.5)
Glucose, Bld: 152 mg/dL — ABNORMAL HIGH (ref 70–99)
Sodium: 137 mEq/L (ref 135–145)

## 2011-10-27 MED ORDER — VITAMIN B-12 500 MCG SL SUBL: SUBLINGUAL_TABLET | SUBLINGUAL | Status: DC

## 2011-10-27 MED ORDER — PROMETHAZINE-CODEINE 6.25-10 MG/5ML PO SYRP: 5.0000 mL | ORAL_SOLUTION | ORAL | Status: DC | PRN

## 2011-10-27 MED ORDER — VITAMIN D 1000 UNITS PO TABS: 1000.0000 [IU] | ORAL_TABLET | Freq: Every day | ORAL | Status: DC

## 2011-10-27 MED ORDER — METFORMIN HCL 1000 MG PO TABS: 1000.0000 mg | ORAL_TABLET | Freq: Two times a day (BID) | ORAL | Status: DC

## 2011-10-27 MED ORDER — AZITHROMYCIN 250 MG PO TABS: ORAL_TABLET | ORAL | Status: DC

## 2011-10-27 MED ORDER — LOSARTAN POTASSIUM-HCTZ 100-25 MG PO TABS: 1.0000 | ORAL_TABLET | Freq: Every day | ORAL | Status: DC

## 2011-10-27 MED ORDER — VITAMIN D3 1.25 MG (50000 UT) PO CAPS: 1.0000 | ORAL_CAPSULE | ORAL | Status: DC

## 2011-10-27 NOTE — Assessment & Plan Note (Signed)
See med change 

## 2011-10-27 NOTE — Assessment & Plan Note (Signed)
See Med change 

## 2011-10-27 NOTE — Progress Notes (Signed)
Patient ID: [name], male   DOB: [birth year], 46 y.o.   MRN: [id number]  Subjective:    Patient ID: [name], male    DOB: 02/19/1966, 46 y.o.   MRN: [id number]  Cough Pertinent negatives include no chest pain, rash or sore throat.  Sinusitis Associated symptoms include coughing. Pertinent negatives include no congestion, neck pain, sneezing or sore throat.   The patient presents for a follow-up of  chronic hypertension, chronic dyslipidemia, type 2 diabetes controlled with medicines  BP Readings from Last 3 Encounters:  10/27/11 160/100  12/21/10 140/90  05/30/10 130/90   Wt Readings from Last 3 Encounters:  10/27/11 303 lb (137.44 kg)  12/21/10 313 lb (141.976 kg)  11/04/10 317 lb (143.79 kg)     C/o R thigh pain and weakness in R leg - it would give out. It hurts at night and all day long. The pain is off and on.  The pain is in the front of R thigh.No LBP. He has a pulling sensation in R thigh. It "catches" - and it would go away in a couple seconds. It has gotten worse lately. He had seen Dr Pearletha Forge (Sports Med) and was started on PT.  Review of Systems  Constitutional: Negative for appetite change, fatigue and unexpected weight change.  HENT: Negative for nosebleeds, congestion, sore throat, sneezing, trouble swallowing and neck pain.   Eyes: Negative for itching and visual disturbance.  Respiratory: Positive for cough.   Cardiovascular: Negative for chest pain, palpitations and leg swelling.  Gastrointestinal: Negative for nausea, diarrhea, blood in stool and abdominal distention.  Genitourinary: Negative for frequency and hematuria.  Musculoskeletal: Positive for gait problem (R knee gives out). Negative for back pain and joint swelling.  Skin: Negative for rash.  Neurological: Negative for dizziness, tremors, speech difficulty and weakness.  Psychiatric/Behavioral: Negative for sleep disturbance, dysphoric mood and agitation. The patient is not  nervous/anxious.        Objective:   Physical Exam  Constitutional: He is oriented to person, place, and time. He appears well-developed.  HENT:  Mouth/Throat: Oropharynx is clear and moist.  Eyes: Conjunctivae are normal. Pupils are equal, round, and reactive to light.  Neck: Normal range of motion. No JVD present. No thyromegaly present.  Cardiovascular: Normal rate, regular rhythm, normal heart sounds and intact distal pulses.  Exam reveals no gallop and no friction rub.   No murmur heard. Pulmonary/Chest: Effort normal and breath sounds normal. No respiratory distress. He has no wheezes. He has no rales. He exhibits no tenderness.  Abdominal: Soft. Bowel sounds are normal. He exhibits no distension and no mass. There is no tenderness. There is no rebound and no guarding.  Musculoskeletal: Normal range of motion. He exhibits no edema and no tenderness.       R quad and biceps are atrophic and weaker  Lymphadenopathy:    He has no cervical adenopathy.  Neurological: He is alert and oriented to person, place, and time. He has normal reflexes. No cranial nerve deficit. He exhibits normal muscle tone. Coordination normal.  Skin: Skin is warm and dry. No rash noted.  Psychiatric: He has a normal mood and affect. His behavior is normal. Judgment and thought content normal.       Lab Results  Component Value Date   WBC 5.3 12/21/2010   HGB 13.9 12/21/2010   HCT 40.7 12/21/2010   PLT 191.0 12/21/2010   GLUCOSE 151* 12/21/2010   ALT 16 12/21/2010   AST  22 12/21/2010   NA 139 12/21/2010   K 4.4 12/21/2010   CL 103 12/21/2010   CREATININE 1.10 04/22/2011   BUN 17 12/21/2010   CO2 31 12/21/2010   TSH 0.56 12/21/2010   HGBA1C 9.1* 12/21/2010   MICROALBUR 4.4* 05/30/2010      Assessment & Plan:   No problem-specific assessment & plan notes found for this encounter.

## 2011-10-27 NOTE — Assessment & Plan Note (Signed)
Re-check B12 Re-start B12

## 2011-10-28 LAB — VITAMIN D 25 HYDROXY (VIT D DEFICIENCY, FRACTURES): Vit D, 25-Hydroxy: 16 ng/mL — ABNORMAL LOW (ref 30–89)

## 2011-11-01 ENCOUNTER — Telehealth: Payer: Self-pay | Admitting: Internal Medicine

## 2011-11-01 NOTE — Telephone Encounter (Signed)
[name], please, inform patient that all labs are normal except for low vit D Start Rx followed by daily OTC vit D Thx

## 2011-11-01 NOTE — Telephone Encounter (Signed)
Pt informed

## 2011-11-04 ENCOUNTER — Encounter (HOSPITAL_COMMUNITY): Payer: Self-pay

## 2011-11-04 ENCOUNTER — Encounter: Payer: Self-pay | Admitting: Family Medicine

## 2011-11-04 ENCOUNTER — Ambulatory Visit (INDEPENDENT_AMBULATORY_CARE_PROVIDER_SITE_OTHER): Payer: 59 | Admitting: Family Medicine

## 2011-11-04 ENCOUNTER — Emergency Department (INDEPENDENT_AMBULATORY_CARE_PROVIDER_SITE_OTHER)
Admission: EM | Admit: 2011-11-04 | Discharge: 2011-11-04 | Disposition: A | Discharge status: Home / Self Care | Payer: 59 | Source: Home / Self Care | Attending: Family Medicine | Admitting: Family Medicine

## 2011-11-04 DIAGNOSIS — L03019 Cellulitis of unspecified finger: Secondary | ICD-10-CM

## 2011-11-04 DIAGNOSIS — L03012 Cellulitis of left finger: Secondary | ICD-10-CM

## 2011-11-04 MED ORDER — CEPHALEXIN 500 MG PO CAPS: 500.0000 mg | ORAL_CAPSULE | Freq: Four times a day (QID) | ORAL | Status: DC

## 2011-11-04 NOTE — Progress Notes (Signed)
  Subjective:    Patient ID: [name], male    DOB: Dec 31, 1965, 46 y.o.   MRN: [id number]  HPI  Error.             Review of Systems     Objective:   Physical Exam        Assessment & Plan:
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
## 2011-11-04 NOTE — ED Notes (Signed)
Pt has infected lt thumb for three days.

## 2011-11-04 NOTE — Discharge Instructions (Signed)
Keep wound clean and dry with warm water soaks in soap and water. Take antibiotics as directed. Return to care should your symptoms not improve, or worsen in any way, such as increased redness, warmth, pain, or drainage. Marland Kitchen

## 2011-11-06 ENCOUNTER — Telehealth: Payer: Self-pay

## 2011-11-06 NOTE — Telephone Encounter (Signed)
[facility] Triage Call Report Triage Record Num: [id number] Operator: Audelia Hives Patient Name: Kaydan Wong Call Date & Time: 11/03/2011 8:59:58PM Patient Phone: [phone] PCP: Sonda Primes Patient Gender: Male PCP Fax : [phone] Patient DOB: [birth year] Practice Name: [facility] Reason for Call: Caller: Jim Like; PCP: Sonda Primes; CB#: [phone]; Call Reason: Finger Is Infected. Ingrown Nail; Sx Onset: 11/01/2011; Sx Notes: ; Afebrile; Wt: ; Guideline Used: ; Disp:; Appt Scheduled?: Pt calling regarding possible infection to left thumb, red, swollen, feels like this needs to be lanced. Afebrile. Onset of s/s 11/01/11, worse today. States has to get lanced often, wants appt for Sat clinic. Emergent s/s for Hand Non-Injury r/o per protocol except for see in 24 hours due to new s/s of local infection. Appt sch for 11/04/11 at 10:15 am Dr. Dayton Martes. Protocol(s) Used: Hand Non-Injury Recommended Outcome per Protocol: See Provider within 24 hours Reason for Outcome: New signs and symptoms of local infection Care Advice: ~ SYMPTOM / CONDITION MANAGEMENT Apply local moist heat (such as a warm, wet wash cloth covered with plastic wrap) to the area for 15-20 minutes every 2-3 hours while awake. ~ See a provider within 4 hours if affected area increases in size quickly, red streaks extend from area, or area becomes blistered. ~ 11/03/2011 9:19:20PM Page 1 of 1 [email or web address]

## 2011-11-07 ENCOUNTER — Encounter: Payer: Self-pay | Admitting: Internal Medicine

## 2012-01-05 ENCOUNTER — Other Ambulatory Visit: Payer: 59

## 2012-01-05 ENCOUNTER — Ambulatory Visit (INDEPENDENT_AMBULATORY_CARE_PROVIDER_SITE_OTHER): Payer: 59 | Admitting: Internal Medicine

## 2012-01-05 ENCOUNTER — Encounter: Payer: Self-pay | Admitting: Internal Medicine

## 2012-01-05 VITALS — BP 128/88 | HR 80 | Temp 98.3°F | Resp 16 | Wt 307.0 lb

## 2012-01-05 DIAGNOSIS — J309 Allergic rhinitis, unspecified: Secondary | ICD-10-CM

## 2012-01-05 DIAGNOSIS — L259 Unspecified contact dermatitis, unspecified cause: Secondary | ICD-10-CM

## 2012-01-05 DIAGNOSIS — L309 Dermatitis, unspecified: Secondary | ICD-10-CM | POA: Insufficient documentation

## 2012-01-05 MED ORDER — LOSARTAN POTASSIUM 100 MG PO TABS: 100.0000 mg | ORAL_TABLET | Freq: Every day | ORAL | Status: DC

## 2012-01-05 MED ORDER — LORATADINE 10 MG PO TABS: 10.0000 mg | ORAL_TABLET | Freq: Every day | ORAL | Status: DC

## 2012-01-05 MED ORDER — HYDROXYZINE HCL 25 MG PO TABS: 25.0000 mg | ORAL_TABLET | Freq: Three times a day (TID) | ORAL | Status: AC | PRN

## 2012-01-05 MED ORDER — METHYLPREDNISOLONE ACETATE 80 MG/ML IJ SUSP
120.0000 mg | Freq: Once | INTRAMUSCULAR | Status: AC
  Administered 2012-01-05: 120 mg via INTRAMUSCULAR

## 2012-01-05 MED ORDER — TRIAMCINOLONE ACETONIDE 0.5 % EX CREA: TOPICAL_CREAM | Freq: Three times a day (TID) | CUTANEOUS | Status: AC

## 2012-01-05 NOTE — Progress Notes (Signed)
Patient ID: [name], male   DOB: 1966-07-27, 46 y.o.   MRN: [id number] Patient ID: [name], male   DOB: 1965-11-19, 46 y.o.   MRN: [id number]  Subjective:    Patient ID: [name], male    DOB: Aug 02, 1966, 46 y.o.   MRN: [id number]  HPI The patient presents for a follow-up of  chronic hypertension, chronic dyslipidemia, type 2 diabetes controlled with medicines  C/o bad rash x 2 wks - worse than ever this spring  BP Readings from Last 3 Encounters:  01/05/12 128/88  11/04/11 145/101  11/04/11 150/102   Wt Readings from Last 3 Encounters:  01/05/12 307 lb (139.254 kg)  11/04/11 298 lb (135.172 kg)  10/27/11 303 lb (137.44 kg)     F/u on R thigh pain and weakness in R leg - resolved Review of Systems  Constitutional: Negative for appetite change, fatigue and unexpected weight change.  HENT: Negative for nosebleeds and trouble swallowing.   Eyes: Negative for itching and visual disturbance.  Cardiovascular: Negative for palpitations and leg swelling.  Gastrointestinal: Negative for nausea, diarrhea, blood in stool and abdominal distention.  Genitourinary: Negative for frequency and hematuria.  Musculoskeletal: Positive for gait problem (R knee gives out). Negative for back pain and joint swelling.  Neurological: Negative for dizziness, tremors, speech difficulty and weakness.  Psychiatric/Behavioral: Negative for sleep disturbance, dysphoric mood and agitation. The patient is not nervous/anxious.        Objective:   Physical Exam  Constitutional: He is oriented to person, place, and time. He appears well-developed.  HENT:  Mouth/Throat: Oropharynx is clear and moist.  Eyes: Conjunctivae are normal. Pupils are equal, round, and reactive to light.  Neck: Normal range of motion. No JVD present. No thyromegaly present.  Cardiovascular: Normal rate, regular rhythm, normal heart sounds and intact distal pulses.  Exam reveals no gallop and no friction rub.   No murmur  heard. Pulmonary/Chest: Effort normal and breath sounds normal. No respiratory distress. He has no wheezes. He has no rales. He exhibits no tenderness.  Abdominal: Soft. Bowel sounds are normal. He exhibits no distension and no mass. There is no tenderness. There is no rebound and no guarding.  Musculoskeletal: Normal range of motion. He exhibits no edema and no tenderness.       R quad and biceps are atrophic and weaker  Lymphadenopathy:    He has no cervical adenopathy.  Neurological: He is alert and oriented to person, place, and time. He has normal reflexes. No cranial nerve deficit. He exhibits normal muscle tone. Coordination normal.  Skin: Skin is warm and dry. Rash noted. There is erythema.       A very extensive eczema on UE and LEs  Psychiatric: He has a normal mood and affect. His behavior is normal. Judgment and thought content normal.       Lab Results  Component Value Date   WBC 5.3 12/21/2010   HGB 13.9 12/21/2010   HCT 40.7 12/21/2010   PLT 191.0 12/21/2010   GLUCOSE 152* 10/27/2011   ALT 16 12/21/2010   AST 22 12/21/2010   NA 137 10/27/2011   K 4.3 10/27/2011   CL 100 10/27/2011   CREATININE 1.1 10/27/2011   BUN 12 10/27/2011   CO2 29 10/27/2011   TSH 0.49 10/27/2011   HGBA1C 7.0* 10/27/2011   MICROALBUR 4.4* 05/30/2010      Assessment & Plan:

## 2012-01-05 NOTE — Assessment & Plan Note (Signed)
Seasonal - severe 5/13 Depomerol Triamc Lorat Labs D/c HCTZ

## 2012-01-05 NOTE — Assessment & Plan Note (Signed)
Continue with current prescription therapy as reflected on the Med list.  

## 2012-01-09 LAB — ALLERGY PROFILE REGION II-DC, DE, MD, ~~LOC~~, VA
Allergen, D pternoyssinus,d7: 9.59 kU/L — ABNORMAL HIGH (ref <0.35)
Alternaria Alternata: 2.52 kU/L — ABNORMAL HIGH (ref <0.35)
Aspergillus fumigatus, IgG: 4.14 kU/L — ABNORMAL HIGH (ref <0.35)
Bermuda Grass: 1.13 kU/L — ABNORMAL HIGH (ref <0.35)
Box Elder IgE: 0.56 kU/L — ABNORMAL HIGH (ref <0.35)
Cat Dander: 4.93 kU/L — ABNORMAL HIGH (ref <0.35)
Cladosporium Herbarum: 2.79 kU/L — ABNORMAL HIGH (ref <0.35)
Cockroach: 9.09 kU/L — ABNORMAL HIGH (ref <0.35)
Common Ragweed: 1.22 kU/L — ABNORMAL HIGH (ref <0.35)
D. farinae: 15.1 kU/L — ABNORMAL HIGH (ref <0.35)
Dog Dander: 2.48 kU/L — ABNORMAL HIGH (ref <0.35)
Elm IgE: 1.17 kU/L — ABNORMAL HIGH (ref <0.35)
IgE (Immunoglobulin E), Serum: 2514.2 [IU]/mL — ABNORMAL HIGH (ref 0.0–180.0)
Johnson Grass: 0.39 kU/L — ABNORMAL HIGH (ref <0.35)
Lamb's Quarters: 0.47 kU/L — ABNORMAL HIGH (ref <0.35)
Meadow Grass: 10.9 kU/L — ABNORMAL HIGH (ref <0.35)
Oak: >100 kU/L — ABNORMAL HIGH (ref <0.35)
Pecan/Hickory Tree IgE: 2.03 kU/L — ABNORMAL HIGH (ref <0.35)

## 2012-01-09 LAB — ALLERGEN FOOD PROFILE SPECIFIC IGE
Apple: 47.5 kU/L — ABNORMAL HIGH (ref <0.35)
Chicken IgE: 5.1 kU/L — ABNORMAL HIGH (ref <0.35)
Corn: 0.55 kU/L — ABNORMAL HIGH (ref <0.35)
Egg White IgE: 0.15 kU/L (ref <0.35)
Fish Cod: 0.83 kU/L — ABNORMAL HIGH (ref <0.35)
Milk IgE: 0.1 kU/L (ref <0.35)
Orange: 0.25 kU/L (ref <0.35)
Peanut IgE: 9.71 kU/L — ABNORMAL HIGH (ref <0.35)
Shrimp IgE: 38 kU/L — ABNORMAL HIGH (ref <0.35)
Soybean IgE: 1.59 kU/L — ABNORMAL HIGH (ref <0.35)
Tomato IgE: 0.65 kU/L — ABNORMAL HIGH (ref <0.35)
Tuna IgE: 2.14 kU/L — ABNORMAL HIGH (ref <0.35)
Wheat IgE: 0.31 kU/L (ref <0.35)

## 2012-01-12 ENCOUNTER — Telehealth: Payer: Self-pay | Admitting: *Deleted

## 2012-01-12 DIAGNOSIS — L309 Dermatitis, unspecified: Secondary | ICD-10-CM

## 2012-01-12 NOTE — Telephone Encounter (Signed)
Ok. Done 

## 2012-01-12 NOTE — Telephone Encounter (Signed)
Pt states he would like to see an allergist/allergy specialist. Please advise.

## 2012-01-13 ENCOUNTER — Other Ambulatory Visit: Payer: Self-pay | Admitting: Internal Medicine

## 2012-01-16 ENCOUNTER — Other Ambulatory Visit: Payer: Self-pay | Admitting: *Deleted

## 2012-01-16 MED ORDER — LOSARTAN POTASSIUM 100 MG PO TABS: 100.0000 mg | ORAL_TABLET | Freq: Every day | ORAL | Status: DC

## 2012-01-19 ENCOUNTER — Other Ambulatory Visit: Payer: Self-pay | Admitting: Internal Medicine

## 2012-01-19 ENCOUNTER — Other Ambulatory Visit (HOSPITAL_COMMUNITY): Payer: Self-pay | Admitting: Internal Medicine

## 2012-01-19 MED ORDER — CEPHALEXIN 500 MG PO CAPS: 500.0000 mg | ORAL_CAPSULE | Freq: Four times a day (QID) | ORAL | Status: AC

## 2012-01-19 NOTE — Telephone Encounter (Signed)
Pt requesting cephalexin---[facility]  --

## 2012-01-19 NOTE — Telephone Encounter (Signed)
OK to fill this prescription with additional refills x0 Thank you!  

## 2012-02-20 ENCOUNTER — Institutional Professional Consult (permissible substitution): Payer: 59 | Admitting: Internal Medicine

## 2012-03-15 ENCOUNTER — Ambulatory Visit (INDEPENDENT_AMBULATORY_CARE_PROVIDER_SITE_OTHER): Payer: 59 | Admitting: Internal Medicine

## 2012-03-15 ENCOUNTER — Encounter: Payer: Self-pay | Admitting: Internal Medicine

## 2012-03-15 VITALS — BP 130/90 | HR 80 | Resp 16 | Wt 309.0 lb

## 2012-03-15 DIAGNOSIS — R21 Rash and other nonspecific skin eruption: Secondary | ICD-10-CM

## 2012-03-15 DIAGNOSIS — I10 Essential (primary) hypertension: Secondary | ICD-10-CM

## 2012-03-15 DIAGNOSIS — L309 Dermatitis, unspecified: Secondary | ICD-10-CM

## 2012-03-15 DIAGNOSIS — L259 Unspecified contact dermatitis, unspecified cause: Secondary | ICD-10-CM

## 2012-03-15 DIAGNOSIS — E119 Type 2 diabetes mellitus without complications: Secondary | ICD-10-CM

## 2012-03-15 DIAGNOSIS — J309 Allergic rhinitis, unspecified: Secondary | ICD-10-CM

## 2012-03-15 MED ORDER — PREDNISONE 10 MG PO TABS: ORAL_TABLET | ORAL | Status: DC

## 2012-03-15 MED ORDER — LORATADINE 10 MG PO TABS: 10.0000 mg | ORAL_TABLET | Freq: Every day | ORAL | Status: DC

## 2012-03-15 MED ORDER — METHYLPREDNISOLONE ACETATE 80 MG/ML IJ SUSP
120.0000 mg | Freq: Once | INTRAMUSCULAR | Status: AC
  Administered 2012-03-15: 120 mg via INTRAMUSCULAR

## 2012-03-16 ENCOUNTER — Encounter: Payer: Self-pay | Admitting: Internal Medicine

## 2012-03-16 NOTE — Progress Notes (Signed)
Subjective:    Patient ID: [name], male    DOB: [birth year], 46 y.o.   MRN: [id number]  Rash This is a recurrent problem. The current episode started in the past 7 days. The affected locations include the face, torso, chest, left arm and right arm. He was exposed to nothing. Pertinent negatives include no diarrhea or fatigue. Past treatments include antihistamine and anti-itch cream. The treatment provided mild relief. His past medical history is significant for allergies and eczema.   The patient presents for a follow-up of  chronic hypertension, type 2 diabetes controlled with medicines  C/o rash x 1-2 wks  BP Readings from Last 3 Encounters:  03/15/12 130/90  01/05/12 128/88  11/04/11 145/101   Wt Readings from Last 3 Encounters:  03/15/12 309 lb (140.161 kg)  01/05/12 307 lb (139.254 kg)  11/04/11 298 lb (135.172 kg)     F/u on R thigh pain and weakness in R leg - resolved  Review of Systems  Constitutional: Negative for appetite change, fatigue and unexpected weight change.  HENT: Negative for nosebleeds and trouble swallowing.   Eyes: Negative for itching and visual disturbance.  Cardiovascular: Negative for palpitations and leg swelling.  Gastrointestinal: Negative for nausea, diarrhea, blood in stool and abdominal distention.  Genitourinary: Negative for frequency and hematuria.  Musculoskeletal: Positive for gait problem (R knee gives out). Negative for back pain and joint swelling.  Skin: Positive for rash.  Neurological: Negative for dizziness, tremors, speech difficulty and weakness.  Psychiatric/Behavioral: Negative for disturbed wake/sleep cycle, dysphoric mood and agitation. The patient is not nervous/anxious.        Objective:   Physical Exam  Constitutional: He is oriented to person, place, and time. He appears well-developed.  HENT:  Mouth/Throat: Oropharynx is clear and moist.  Eyes: Conjunctivae are normal. Pupils are equal, round, and reactive  to light.  Neck: Normal range of motion. No JVD present. No thyromegaly present.  Cardiovascular: Normal rate, regular rhythm, normal heart sounds and intact distal pulses.  Exam reveals no gallop and no friction rub.   No murmur heard. Pulmonary/Chest: Effort normal and breath sounds normal. No respiratory distress. He has no wheezes. He has no rales. He exhibits no tenderness.  Abdominal: Soft. Bowel sounds are normal. He exhibits no distension and no mass. There is no tenderness. There is no rebound and no guarding.  Musculoskeletal: Normal range of motion. He exhibits no edema and no tenderness.       R quad and biceps are atrophic and weaker  Lymphadenopathy:    He has no cervical adenopathy.  Neurological: He is alert and oriented to person, place, and time. He has normal reflexes. No cranial nerve deficit. He exhibits normal muscle tone. Coordination normal.  Skin: Skin is warm and dry. Rash noted. There is erythema.        eczema on face, torso, UE and LEs  Psychiatric: He has a normal mood and affect. His behavior is normal. Judgment and thought content normal.       Lab Results  Component Value Date   WBC 5.3 12/21/2010   HGB 13.9 12/21/2010   HCT 40.7 12/21/2010   PLT 191.0 12/21/2010   GLUCOSE 152* 10/27/2011   ALT 16 12/21/2010   AST 22 12/21/2010   NA 137 10/27/2011   K 4.3 10/27/2011   CL 100 10/27/2011   CREATININE 1.1 10/27/2011   BUN 12 10/27/2011   CO2 29 10/27/2011   TSH 0.49 10/27/2011  HGBA1C 7.0* 10/27/2011   MICROALBUR 4.4* 05/30/2010      Assessment & Plan:

## 2012-03-16 NOTE — Assessment & Plan Note (Signed)
See Meds Depomedrol 120 mg im

## 2012-03-16 NOTE — Assessment & Plan Note (Signed)
Continue with current prescription therapy as reflected on the Med list.  

## 2012-03-16 NOTE — Assessment & Plan Note (Signed)
See Rx 

## 2012-03-29 ENCOUNTER — Ambulatory Visit (INDEPENDENT_AMBULATORY_CARE_PROVIDER_SITE_OTHER): Payer: 59 | Admitting: Internal Medicine

## 2012-03-29 ENCOUNTER — Encounter: Payer: Self-pay | Admitting: Internal Medicine

## 2012-03-29 VITALS — BP 162/110 | HR 83 | Ht 74.0 in | Wt 314.6 lb

## 2012-03-29 DIAGNOSIS — J302 Other seasonal allergic rhinitis: Secondary | ICD-10-CM

## 2012-03-29 DIAGNOSIS — L309 Dermatitis, unspecified: Secondary | ICD-10-CM

## 2012-03-29 DIAGNOSIS — J309 Allergic rhinitis, unspecified: Secondary | ICD-10-CM

## 2012-03-29 DIAGNOSIS — T781XXA Other adverse food reactions, not elsewhere classified, initial encounter: Secondary | ICD-10-CM

## 2012-03-29 DIAGNOSIS — L259 Unspecified contact dermatitis, unspecified cause: Secondary | ICD-10-CM

## 2012-03-29 DIAGNOSIS — Z91018 Allergy to other foods: Secondary | ICD-10-CM

## 2012-03-29 DIAGNOSIS — J3089 Other allergic rhinitis: Secondary | ICD-10-CM

## 2012-03-29 MED ORDER — PIMECROLIMUS 1 % EX CREA: TOPICAL_CREAM | Freq: Two times a day (BID) | CUTANEOUS | Status: AC

## 2012-03-29 MED ORDER — EPINEPHRINE 0.3 MG/0.3ML IJ DEVI: INTRAMUSCULAR | Status: AC
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
## 2012-03-29 NOTE — Patient Instructions (Addendum)
Strictly avoid foods/ shellfish/ shrimp that cause you problems.  It might help to pretreat with an antihistamine if you are going to eat where you can't be certain what you are eating.  Suggest non-sedating antihistamines: Allegra/ fexofenadine, Zyrtec/ cetirizine, Claritin/ loratadine. You can always use benadryl, which can be quicker, but sedating.   Meclizine is a little less sedating than Dramamine for motion sickness.   Script for Elidel skin cream

## 2012-04-05 DIAGNOSIS — Z91018 Allergy to other foods: Secondary | ICD-10-CM | POA: Insufficient documentation

## 2012-04-05 NOTE — Assessment & Plan Note (Signed)
This and food allergy are his primary concerns. Seasonal itching is intense. Plan-Elidel skin cream with discussion.

## 2012-04-05 NOTE — Assessment & Plan Note (Signed)
He understands to avoid foods associated with symptoms. We discussed pretreating with an antihistamine if uncertain and unavoidable contacts. Consider EpiPen.

## 2012-04-05 NOTE — Assessment & Plan Note (Signed)
Discussed nonsedating antihistamines and availability of nasal sprays.

## 2012-06-10 ENCOUNTER — Other Ambulatory Visit: Payer: Self-pay | Admitting: Internal Medicine

## 2012-08-16 ENCOUNTER — Ambulatory Visit: Payer: 59 | Admitting: Internal Medicine

## 2012-08-30 ENCOUNTER — Ambulatory Visit: Payer: 59 | Admitting: Internal Medicine

## 2012-09-13 ENCOUNTER — Ambulatory Visit: Payer: 59 | Admitting: Internal Medicine

## 2012-09-27 ENCOUNTER — Ambulatory Visit: Payer: 59 | Admitting: Internal Medicine

## 2012-10-14 ENCOUNTER — Ambulatory Visit: Payer: 59 | Admitting: Internal Medicine

## 2012-10-25 ENCOUNTER — Ambulatory Visit: Payer: 59 | Admitting: Internal Medicine

## 2012-11-07 ENCOUNTER — Other Ambulatory Visit: Payer: Self-pay | Admitting: *Deleted

## 2012-11-07 MED ORDER — METFORMIN HCL 1000 MG PO TABS: 1000.0000 mg | ORAL_TABLET | Freq: Every day | ORAL | Status: DC

## 2012-11-22 ENCOUNTER — Ambulatory Visit (INDEPENDENT_AMBULATORY_CARE_PROVIDER_SITE_OTHER): Payer: 59 | Admitting: Internal Medicine

## 2012-11-22 ENCOUNTER — Encounter: Payer: Self-pay | Admitting: Internal Medicine

## 2012-11-22 VITALS — BP 150/100 | HR 80 | Temp 97.4°F | Resp 16 | Wt 311.0 lb

## 2012-11-22 DIAGNOSIS — L0201 Cutaneous abscess of face: Secondary | ICD-10-CM

## 2012-11-22 DIAGNOSIS — L03211 Cellulitis of face: Secondary | ICD-10-CM

## 2012-11-22 MED ORDER — MUPIROCIN 2 % EX OINT: TOPICAL_OINTMENT | CUTANEOUS | Status: DC

## 2012-11-22 MED ORDER — DOXYCYCLINE HYCLATE 100 MG PO TABS: 100.0000 mg | ORAL_TABLET | Freq: Two times a day (BID) | ORAL | Status: DC

## 2012-11-22 NOTE — Patient Instructions (Addendum)

## 2012-11-25 DIAGNOSIS — L0201 Cutaneous abscess of face: Secondary | ICD-10-CM | POA: Insufficient documentation

## 2012-11-25 NOTE — Progress Notes (Signed)
Subjective:    Patient ID: [name], male    DOB: March 19, 1966, 47 y.o.   MRN: [id number]  HPI  C/o a boil on L chin x 4-5 d, painful   Review of Systems  Constitutional: Negative for appetite change, fatigue and unexpected weight change.  HENT: Positive for congestion and rhinorrhea. Negative for nosebleeds, sore throat, sneezing, trouble swallowing and neck pain.   Eyes: Negative for itching and visual disturbance.  Respiratory: Negative for cough.   Cardiovascular: Negative for chest pain, palpitations and leg swelling.  Gastrointestinal: Negative for nausea, diarrhea, blood in stool and abdominal distention.  Genitourinary: Negative for frequency and hematuria.  Musculoskeletal: Negative for back pain, joint swelling and gait problem.  Skin: Positive for rash.  Neurological: Negative for dizziness, tremors, speech difficulty and weakness.  Psychiatric/Behavioral: Negative for sleep disturbance, dysphoric mood and agitation. The patient is not nervous/anxious.        Objective:   Physical Exam  Constitutional: He is oriented to person, place, and time. He appears well-developed. No distress.  HENT:  Mouth/Throat: Oropharynx is clear and moist.  Eyes: Conjunctivae are normal. Pupils are equal, round, and reactive to light.  Neck: Normal range of motion. No JVD present. No thyromegaly present.  Cardiovascular: Normal rate, regular rhythm, normal heart sounds and intact distal pulses.  Exam reveals no gallop and no friction rub.   No murmur heard. Pulmonary/Chest: Effort normal and breath sounds normal. No respiratory distress. He has no wheezes. He has no rales. He exhibits no tenderness.  Abdominal: Soft. Bowel sounds are normal. He exhibits no distension and no mass. There is no tenderness. There is no rebound and no guarding.  Musculoskeletal: Normal range of motion. He exhibits no edema and no tenderness.  Lymphadenopathy:    He has no cervical adenopathy.   Neurological: He is alert and oriented to person, place, and time. He has normal reflexes. No cranial nerve deficit. He exhibits normal muscle tone. Coordination normal.  Skin: Skin is warm and dry. No rash noted. There is erythema.  L chin oval tender erythematous swelling 2.5x1.8 cm w/extension of the swelling to the distal neck area  Psychiatric: He has a normal mood and affect. His behavior is normal. Judgment and thought content normal.   Procedure Note :    Procedure :   Point of care (POC) sonography examination   Indication: L chin/jaw/upper neckswelling   Equipment used: Sonosite M-Turbo with HFL38x/13-6 MHz transducer linear probe. The images were stored in the unit and later transferred in storage.  The patient was placed in a decubitus position.  This study revealed a hypo/heteroechoic lesion in in the L chin area   Impression: abscess L chin  Procedure note:  Incision and Drainage of an abscess   Indication : a L chin localized collection of pus that is tender and not spontaneously resolving.    Risks including unsuccessful procedure , possible need for a repeat procedure due to pus accumulation, scar formation, and others as well as benefits were explained to the patient in detail. Written consent was obtained/signed.    The patient was placed in a decubitus position. The area of an abscess was prepped with povidone-iodine and draped in a sterile fashion. Local anesthesia with  1  cc of 2% lidocaine and epinephrine  was administered.  0.7 cm incision with #11 strait blade was made. About 1.5 cc of purulent material was expressed. The abscess cavity was explored with a sterile hemostat and the walled-  off pockets and septae were broken down bluntly. The cavity was irrigated with the rest of the anesthetic in the syringe and packed with 1/2 inch of  the iodoform gauze.   The wound was dressed with antibiotic ointment and Telfa pad.  Tolerated well. Complications:  None.   Wound instructions provided.       Assessment & Plan:

## 2012-11-25 NOTE — Assessment & Plan Note (Signed)
[place] to confirm cavitation See I&D Doxy x 10 d Bactroban

## 2012-12-04 ENCOUNTER — Other Ambulatory Visit: Payer: Self-pay | Admitting: Internal Medicine

## 2012-12-04 DIAGNOSIS — Z125 Encounter for screening for malignant neoplasm of prostate: Secondary | ICD-10-CM

## 2012-12-04 DIAGNOSIS — Z0000 Encounter for general adult medical examination without abnormal findings: Secondary | ICD-10-CM

## 2012-12-25 ENCOUNTER — Other Ambulatory Visit: Payer: Self-pay | Admitting: *Deleted

## 2012-12-25 MED ORDER — LOSARTAN POTASSIUM 100 MG PO TABS: 100.0000 mg | ORAL_TABLET | Freq: Every day | ORAL | Status: DC

## 2012-12-30 ENCOUNTER — Ambulatory Visit: Payer: 59 | Admitting: Internal Medicine

## 2013-01-01 ENCOUNTER — Encounter: Payer: 59 | Admitting: Internal Medicine

## 2013-01-24 ENCOUNTER — Other Ambulatory Visit: Payer: Self-pay | Admitting: Internal Medicine

## 2013-02-05 ENCOUNTER — Encounter: Payer: 59 | Admitting: Internal Medicine

## 2013-02-11 ENCOUNTER — Ambulatory Visit: Payer: 59 | Admitting: Internal Medicine

## 2013-03-03 ENCOUNTER — Ambulatory Visit: Payer: 59 | Admitting: Internal Medicine

## 2013-03-06 ENCOUNTER — Encounter: Payer: Self-pay | Admitting: Internal Medicine

## 2013-03-06 ENCOUNTER — Ambulatory Visit (INDEPENDENT_AMBULATORY_CARE_PROVIDER_SITE_OTHER): Payer: 59 | Admitting: Internal Medicine

## 2013-03-06 VITALS — BP 114/90 | HR 80 | Temp 98.2°F | Resp 16 | Wt 316.0 lb

## 2013-03-06 DIAGNOSIS — J3089 Other allergic rhinitis: Secondary | ICD-10-CM

## 2013-03-06 DIAGNOSIS — I10 Essential (primary) hypertension: Secondary | ICD-10-CM

## 2013-03-06 DIAGNOSIS — M25569 Pain in unspecified knee: Secondary | ICD-10-CM

## 2013-03-06 DIAGNOSIS — Z5189 Encounter for other specified aftercare: Secondary | ICD-10-CM

## 2013-03-06 DIAGNOSIS — L03311 Cellulitis of abdominal wall: Secondary | ICD-10-CM

## 2013-03-06 DIAGNOSIS — J309 Allergic rhinitis, unspecified: Secondary | ICD-10-CM

## 2013-03-06 DIAGNOSIS — L02219 Cutaneous abscess of trunk, unspecified: Secondary | ICD-10-CM

## 2013-03-06 DIAGNOSIS — E669 Obesity, unspecified: Secondary | ICD-10-CM

## 2013-03-06 DIAGNOSIS — M25562 Pain in left knee: Secondary | ICD-10-CM

## 2013-03-06 DIAGNOSIS — J302 Other seasonal allergic rhinitis: Secondary | ICD-10-CM

## 2013-03-06 DIAGNOSIS — E119 Type 2 diabetes mellitus without complications: Secondary | ICD-10-CM

## 2013-03-06 MED ORDER — METHYLPREDNISOLONE ACETATE 80 MG/ML IJ SUSP
80.0000 mg | Freq: Once | INTRAMUSCULAR | Status: AC
  Administered 2013-03-06: 80 mg via INTRAMUSCULAR

## 2013-03-06 MED ORDER — CEPHALEXIN 500 MG PO CAPS: 1000.0000 mg | ORAL_CAPSULE | Freq: Two times a day (BID) | ORAL | Status: DC

## 2013-03-06 NOTE — Assessment & Plan Note (Addendum)
Continue with current prescription therapy as reflected on the Med list.  Advised gluten free, no milk diet

## 2013-03-06 NOTE — Assessment & Plan Note (Signed)
Wt Readings from Last 3 Encounters:  03/06/13 316 lb (143.337 kg)  11/22/12 311 lb (141.069 kg)  03/29/12 314 lb 9.6 oz (142.702 kg)

## 2013-03-06 NOTE — Patient Instructions (Signed)
Try gluten free diet    Postprocedure instructions :    A Band-Aid should be left on for 12 hours. Injection therapy is not a cure itself. It is used in conjunction with other modalities. You can use nonsteroidal anti-inflammatories like ibuprofen , hot and cold compresses. Rest is recommended in the next 24 hours. You need to report immediately  if fever, chills or any signs of infection develop.

## 2013-03-06 NOTE — Assessment & Plan Note (Signed)
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 Specific elevations to most allergens tested. Hyper IgE syndrome.

## 2013-03-06 NOTE — Progress Notes (Signed)
Subjective:    Patient ID: [name], male    DOB: [birth year], 47 y.o.   MRN: [id number]  HPI  The patient presents for a follow-up of  chronic hypertension, chronic food allergies and eczema, type 2 diabetes controlled with medicines  C/o more pain and swelling in the L knee - he asked me to drain/inject it C/o skin infection in the abd scar  Review of Systems  Constitutional: Negative for appetite change, fatigue and unexpected weight change.  HENT: Negative for nosebleeds, congestion, sore throat, sneezing, trouble swallowing and neck pain.   Eyes: Negative for itching and visual disturbance.  Respiratory: Negative for cough.   Cardiovascular: Negative for chest pain, palpitations and leg swelling.  Gastrointestinal: Negative for nausea, diarrhea, blood in stool and abdominal distention.  Genitourinary: Negative for frequency and hematuria.  Musculoskeletal: Positive for arthralgias. Negative for back pain, joint swelling and gait problem.  Skin: Positive for rash.  Neurological: Negative for dizziness, tremors, speech difficulty and weakness.  Psychiatric/Behavioral: Negative for sleep disturbance, dysphoric mood and agitation. The patient is not nervous/anxious.        Objective:   Physical Exam  Constitutional: He is oriented to person, place, and time. He appears well-developed. No distress.  NAD  HENT:  Mouth/Throat: Oropharynx is clear and moist.  Eyes: Conjunctivae are normal. Pupils are equal, round, and reactive to light.  Neck: Normal range of motion. No JVD present. No thyromegaly present.  Cardiovascular: Normal rate, regular rhythm, normal heart sounds and intact distal pulses.  Exam reveals no gallop and no friction rub.   No murmur heard. Pulmonary/Chest: Effort normal and breath sounds normal. No respiratory distress. He has no wheezes. He has no rales. He exhibits no tenderness.  Abdominal: Soft. Bowel sounds are normal. He exhibits no distension and no  mass. There is no tenderness. There is no rebound and no guarding.  Musculoskeletal: Normal range of motion. He exhibits tenderness. He exhibits no edema.  R knee - WNL. L knee is larger, tender w/ROM. No fluctuation noted  Lymphadenopathy:    He has no cervical adenopathy.  Neurological: He is alert and oriented to person, place, and time. He has normal reflexes. He displays normal reflexes. No cranial nerve deficit. He exhibits normal muscle tone. He displays a negative Romberg sign. Coordination and gait normal.  Skin: Skin is warm and dry. No rash noted. There is erythema.  Umbilical keloid scar w/irritated area, crusted. No stitch noted  Psychiatric: He has a normal mood and affect. His behavior is normal. Judgment and thought content normal.       Procedure Note :     Procedure : Joint Injection, L  knee   Indication:  Joint osteoarthritis with refractory  chronic pain.   Risks including unsuccessful procedure , bleeding, infection, bruising, skin atrophy and others were explained to the patient in detail as well as the benefits. Informed consent was obtained and signed.   Tthe patient was placed in a comfortable position. Lateral approach was used. Skin was prepped with Betadine and alcohol  and anesthetized a cooling spray. Aspiration was attempted w/an 21 g needle - dry tap. Then, a 5 cc syringe  was used for a joint injection.. The same needle was left in the knee joint cavity and injected the joint with 5 mL of 2% lidocaine and 80 mg of Depo-Medrol .  Band-Aid was applied.   Tolerated well. Complications: None. Good pain relief following the procedure.  Assessment & Plan:

## 2013-03-09 ENCOUNTER — Encounter: Payer: Self-pay | Admitting: Internal Medicine

## 2013-03-09 DIAGNOSIS — L03311 Cellulitis of abdominal wall: Secondary | ICD-10-CM | POA: Insufficient documentation

## 2013-03-09 NOTE — Assessment & Plan Note (Signed)
Continue with current prescription therapy as reflected on the Med list.  

## 2013-03-09 NOTE — Assessment & Plan Note (Signed)
See procedure 

## 2013-03-09 NOTE — Assessment & Plan Note (Signed)
7/14 - a 5 mm granulating wound w/a crust -- ?a stitch Keflex Rx Will watch

## 2013-03-12 ENCOUNTER — Ambulatory Visit: Payer: 59 | Admitting: Internal Medicine

## 2013-03-28 ENCOUNTER — Encounter: Payer: 59 | Admitting: Internal Medicine

## 2013-04-30 ENCOUNTER — Encounter: Payer: 59 | Admitting: Internal Medicine

## 2013-05-01 ENCOUNTER — Encounter: Payer: Self-pay | Admitting: Internal Medicine

## 2013-05-01 ENCOUNTER — Ambulatory Visit (INDEPENDENT_AMBULATORY_CARE_PROVIDER_SITE_OTHER): Payer: 59 | Admitting: Internal Medicine

## 2013-05-01 VITALS — BP 146/100 | HR 84 | Temp 98.8°F | Resp 16 | Wt 312.0 lb

## 2013-05-01 DIAGNOSIS — Z23 Encounter for immunization: Secondary | ICD-10-CM

## 2013-05-01 DIAGNOSIS — L309 Dermatitis, unspecified: Secondary | ICD-10-CM

## 2013-05-01 DIAGNOSIS — E119 Type 2 diabetes mellitus without complications: Secondary | ICD-10-CM

## 2013-05-01 DIAGNOSIS — L259 Unspecified contact dermatitis, unspecified cause: Secondary | ICD-10-CM

## 2013-05-01 DIAGNOSIS — E669 Obesity, unspecified: Secondary | ICD-10-CM

## 2013-05-01 DIAGNOSIS — I10 Essential (primary) hypertension: Secondary | ICD-10-CM

## 2013-05-01 MED ORDER — METFORMIN HCL 1000 MG PO TABS: 1000.0000 mg | ORAL_TABLET | Freq: Every day | ORAL | Status: DC

## 2013-05-01 MED ORDER — VITAMIN B-12 1000 MCG PO TABS: 1000.0000 ug | ORAL_TABLET | Freq: Every day | ORAL | Status: DC

## 2013-05-01 MED ORDER — LOSARTAN POTASSIUM 100 MG PO TABS: 100.0000 mg | ORAL_TABLET | Freq: Every day | ORAL | Status: DC

## 2013-05-01 MED ORDER — CLOTRIMAZOLE-BETAMETHASONE 1-0.05 % EX CREA: TOPICAL_CREAM | Freq: Two times a day (BID) | CUTANEOUS | Status: DC

## 2013-05-01 MED ORDER — VITAMIN D 1000 UNITS PO TABS: 1000.0000 [IU] | ORAL_TABLET | Freq: Every day | ORAL | Status: DC

## 2013-05-01 MED ORDER — METHYLPREDNISOLONE ACETATE 80 MG/ML IJ SUSP
120.0000 mg | Freq: Once | INTRAMUSCULAR | Status: AC
  Administered 2013-05-01: 120 mg via INTRAMUSCULAR

## 2013-05-01 NOTE — Progress Notes (Signed)
Patient ID: [name], male   DOB: September 09, 1965, 47 y.o.   MRN: [id number]  Subjective:    Patient ID: [name], male    DOB: July 13, 1966, 47 y.o.   MRN: [id number]  HPI  The patient presents for a follow-up of  chronic hypertension, chronic food allergies and eczema, type 2 diabetes controlled with medicines  C/o more pain and swelling in the L knee - he asked me to drain/inject it C/o skin infection in the abd scar  Review of Systems  Constitutional: Negative for appetite change, fatigue and unexpected weight change.  HENT: Negative for nosebleeds, congestion, sore throat, sneezing, trouble swallowing and neck pain.   Eyes: Negative for itching and visual disturbance.  Respiratory: Negative for cough.   Cardiovascular: Negative for chest pain, palpitations and leg swelling.  Gastrointestinal: Negative for nausea, diarrhea, blood in stool and abdominal distention.  Genitourinary: Negative for frequency and hematuria.  Musculoskeletal: Positive for arthralgias. Negative for back pain, joint swelling and gait problem.  Skin: Positive for rash.  Neurological: Negative for dizziness, tremors, speech difficulty and weakness.  Psychiatric/Behavioral: Negative for sleep disturbance, dysphoric mood and agitation. The patient is not nervous/anxious.        Objective:   Physical Exam  Constitutional: He is oriented to person, place, and time. He appears well-developed. No distress.  NAD  HENT:  Mouth/Throat: Oropharynx is clear and moist.  Eyes: Conjunctivae are normal. Pupils are equal, round, and reactive to light.  Neck: Normal range of motion. No JVD present. No thyromegaly present.  Cardiovascular: Normal rate, regular rhythm, normal heart sounds and intact distal pulses.  Exam reveals no gallop and no friction rub.   No murmur heard. Pulmonary/Chest: Effort normal and breath sounds normal. No respiratory distress. He has no wheezes. He has no rales. He exhibits no tenderness.   Abdominal: Soft. Bowel sounds are normal. He exhibits no distension and no mass. There is no tenderness. There is no rebound and no guarding.  Musculoskeletal: Normal range of motion. He exhibits tenderness. He exhibits no edema.  R knee - WNL. L knee is larger, tender w/ROM. No fluctuation noted  Lymphadenopathy:    He has no cervical adenopathy.  Neurological: He is alert and oriented to person, place, and time. He has normal reflexes. No cranial nerve deficit. He exhibits normal muscle tone. He displays a negative Romberg sign. Coordination and gait normal.  Skin: Skin is warm and dry. No rash noted. There is erythema.  Umbilical keloid scar w/irritated area, crusted. No stitch noted  Psychiatric: He has a normal mood and affect. His behavior is normal. Judgment and thought content normal.          Assessment & Plan:

## 2013-05-01 NOTE — Assessment & Plan Note (Signed)
Wt Readings from Last 3 Encounters:  05/01/13 312 lb (141.522 kg)  03/06/13 316 lb (143.337 kg)  11/22/12 311 lb (141.069 kg)

## 2013-05-01 NOTE — Assessment & Plan Note (Signed)
Continue with current prescription therapy as reflected on the Med list.  

## 2013-05-01 NOTE — Assessment & Plan Note (Signed)
Lotrisone Depo 80 mg IM

## 2013-08-04 ENCOUNTER — Encounter: Payer: 59 | Admitting: Internal Medicine

## 2013-08-26 ENCOUNTER — Encounter: Payer: 59 | Admitting: Internal Medicine

## 2013-09-02 ENCOUNTER — Ambulatory Visit (INDEPENDENT_AMBULATORY_CARE_PROVIDER_SITE_OTHER): Payer: 59 | Admitting: Family

## 2013-09-02 ENCOUNTER — Encounter: Payer: Self-pay | Admitting: Family

## 2013-09-02 VITALS — BP 148/98 | HR 88 | Wt 309.7 lb

## 2013-09-02 DIAGNOSIS — L739 Follicular disorder, unspecified: Secondary | ICD-10-CM

## 2013-09-02 DIAGNOSIS — J309 Allergic rhinitis, unspecified: Secondary | ICD-10-CM

## 2013-09-02 DIAGNOSIS — L738 Other specified follicular disorders: Secondary | ICD-10-CM

## 2013-09-02 DIAGNOSIS — L678 Other hair color and hair shaft abnormalities: Secondary | ICD-10-CM

## 2013-09-02 MED ORDER — METHYLPREDNISOLONE ACETATE 40 MG/ML IJ SUSP
80.0000 mg | Freq: Once | INTRAMUSCULAR | Status: AC
  Administered 2013-09-02: 80 mg via INTRAMUSCULAR

## 2013-09-02 MED ORDER — CEPHALEXIN 500 MG PO CAPS: 500.0000 mg | ORAL_CAPSULE | Freq: Three times a day (TID) | ORAL | Status: DC

## 2013-09-02 MED ORDER — LEVOCETIRIZINE DIHYDROCHLORIDE 5 MG PO TABS: 5.0000 mg | ORAL_TABLET | Freq: Every evening | ORAL | Status: DC

## 2013-09-02 NOTE — Progress Notes (Signed)
Pre visit review using our clinic review tool, if applicable. No additional management support is needed unless otherwise documented below in the visit note. 

## 2013-09-02 NOTE — Patient Instructions (Addendum)
Claritin or Zyrtec once a day, everyday.  Folliculitis  Folliculitis is redness, soreness, and swelling (inflammation) of the hair follicles. This condition can occur anywhere on the body. People with weakened immune systems, diabetes, or obesity have a greater risk of getting folliculitis. CAUSES  Bacterial infection. This is the most common cause.  Fungal infection.  Viral infection.  Contact with certain chemicals, especially oils and tars. Long-term folliculitis can result from bacteria that live in the nostrils. The bacteria may trigger multiple outbreaks of folliculitis over time. SYMPTOMS Folliculitis most commonly occurs on the scalp, thighs, legs, back, buttocks, and areas where hair is shaved frequently. An early sign of folliculitis is a small, white or yellow, pus-filled, itchy lesion (pustule). These lesions appear on a red, inflamed follicle. They are usually less than 0.2 inches (5 mm) wide. When there is an infection of the follicle that goes deeper, it becomes a boil or furuncle. A group of closely packed boils creates a larger lesion (carbuncle). Carbuncles tend to occur in hairy, sweaty areas of the body. DIAGNOSIS  Your caregiver can usually tell what is wrong by doing a physical exam. A sample may be taken from one of the lesions and tested in a lab. This can help determine what is causing your folliculitis. TREATMENT  Treatment may include:  Applying warm compresses to the affected areas.  Taking antibiotic medicines orally or applying them to the skin.  Draining the lesions if they contain a large amount of pus or fluid.  Laser hair removal for cases of long-lasting folliculitis. This helps to prevent regrowth of the hair. HOME CARE INSTRUCTIONS  Apply warm compresses to the affected areas as directed by your caregiver.  If antibiotics are prescribed, take them as directed. Finish them even if you start to feel better.  You may take over-the-counter medicines  to relieve itching.  Do not shave irritated skin.  Follow up with your caregiver as directed. SEEK IMMEDIATE MEDICAL CARE IF:   You have increasing redness, swelling, or pain in the affected area.  You have a fever. MAKE SURE YOU:  Understand these instructions.  Will watch your condition.  Will get help right away if you are not doing well or get worse. Document Released: 10/23/2001 Document Revised: 02/13/2012 Document Reviewed: 11/14/2011 [facility] Patient Information 2014 [place], [place].
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
## 2013-09-03 ENCOUNTER — Ambulatory Visit: Payer: 59 | Admitting: Internal Medicine

## 2013-09-11 ENCOUNTER — Other Ambulatory Visit: Payer: Self-pay | Admitting: Family

## 2013-09-17 ENCOUNTER — Encounter: Payer: 59 | Admitting: Internal Medicine

## 2013-10-16 ENCOUNTER — Encounter: Payer: 59 | Admitting: Internal Medicine

## 2013-11-02 ENCOUNTER — Other Ambulatory Visit: Payer: Self-pay | Admitting: Internal Medicine

## 2013-11-27 ENCOUNTER — Ambulatory Visit (INDEPENDENT_AMBULATORY_CARE_PROVIDER_SITE_OTHER): Payer: 59 | Admitting: Internal Medicine

## 2013-11-27 ENCOUNTER — Other Ambulatory Visit (INDEPENDENT_AMBULATORY_CARE_PROVIDER_SITE_OTHER): Payer: 59

## 2013-11-27 ENCOUNTER — Encounter: Payer: Self-pay | Admitting: Internal Medicine

## 2013-11-27 VITALS — BP 130/90 | HR 80 | Temp 99.2°F | Resp 16 | Wt 307.0 lb

## 2013-11-27 DIAGNOSIS — E119 Type 2 diabetes mellitus without complications: Secondary | ICD-10-CM

## 2013-11-27 DIAGNOSIS — R29898 Other symptoms and signs involving the musculoskeletal system: Secondary | ICD-10-CM | POA: Insufficient documentation

## 2013-11-27 LAB — VITAMIN B12: Vitamin B-12: >1500 pg/mL — ABNORMAL HIGH (ref 211–911)

## 2013-11-27 LAB — HEPATIC FUNCTION PANEL
ALK PHOS: 82 U/L (ref 39–117)
ALT: 20 U/L (ref 0–53)
AST: 17 U/L (ref 0–37)
Albumin: 4.5 g/dL (ref 3.5–5.2)
Bilirubin, Direct: 0.1 mg/dL (ref 0.0–0.3)
TOTAL PROTEIN: 8.6 g/dL — AB (ref 6.0–8.3)
Total Bilirubin: 1.1 mg/dL (ref 0.3–1.2)

## 2013-11-27 LAB — LIPID PANEL
CHOLESTEROL: 193 mg/dL (ref 0–200)
HDL: 64.4 mg/dL (ref >39.00)
LDL Cholesterol: 112 mg/dL — ABNORMAL HIGH (ref 0–99)
TRIGLYCERIDES: 83 mg/dL (ref 0.0–149.0)
Total CHOL/HDL Ratio: 3
VLDL: 16.6 mg/dL (ref 0.0–40.0)

## 2013-11-27 LAB — BASIC METABOLIC PANEL
BUN: 21 mg/dL (ref 6–23)
CALCIUM: 10.3 mg/dL (ref 8.4–10.5)
CO2: 27 mEq/L (ref 19–32)
CREATININE: 1.3 mg/dL (ref 0.4–1.5)
Chloride: 92 mEq/L — ABNORMAL LOW (ref 96–112)
GFR: 79.91 mL/min (ref >60.00)
GLUCOSE: 371 mg/dL — AB (ref 70–99)
Potassium: 4.8 mEq/L (ref 3.5–5.1)
SODIUM: 131 meq/L — AB (ref 135–145)

## 2013-11-27 LAB — CBC WITH DIFFERENTIAL/PLATELET
Basophils Absolute: 0 10*3/uL (ref 0.0–0.1)
Basophils Relative: 0.5 % (ref 0.0–3.0)
EOS ABS: 0 10*3/uL (ref 0.0–0.7)
Eosinophils Relative: 0.5 % (ref 0.0–5.0)
HCT: 44.5 % (ref 39.0–52.0)
HEMOGLOBIN: 15 g/dL (ref 13.0–17.0)
LYMPHS PCT: 51.4 % — AB (ref 12.0–46.0)
Lymphs Abs: 3.3 10*3/uL (ref 0.7–4.0)
MCHC: 33.6 g/dL (ref 30.0–36.0)
MCV: 91.4 fl (ref 78.0–100.0)
Monocytes Absolute: 0.4 10*3/uL (ref 0.1–1.0)
Monocytes Relative: 5.8 % (ref 3.0–12.0)
NEUTROS ABS: 2.7 10*3/uL (ref 1.4–7.7)
Neutrophils Relative %: 41.8 % — ABNORMAL LOW (ref 43.0–77.0)
PLATELETS: 220 10*3/uL (ref 150.0–400.0)
RBC: 4.86 Mil/uL (ref 4.22–5.81)
RDW: 12.6 % (ref 11.5–14.6)
WBC: 6.5 10*3/uL (ref 4.5–10.5)

## 2013-11-27 LAB — HEMOGLOBIN A1C: Hgb A1c MFr Bld: 15.7 % — ABNORMAL HIGH (ref 4.6–6.5)

## 2013-11-27 LAB — TSH: TSH: 0.63 u[IU]/mL (ref 0.35–5.50)

## 2013-11-27 LAB — GLUCOSE, POCT (MANUAL RESULT ENTRY): POC GLUCOSE: 345 mg/dL — AB (ref 70–99)

## 2013-11-27 LAB — SEDIMENTATION RATE: SED RATE: 22 mm/h (ref 0–22)

## 2013-11-27 MED ORDER — DAPAGLIFLOZIN PRO-METFORMIN ER 10-1000 MG PO TB24: 1.0000 | ORAL_TABLET | ORAL | Status: DC

## 2013-11-27 MED ORDER — CLOTRIMAZOLE-BETAMETHASONE 1-0.05 % EX CREA: TOPICAL_CREAM | Freq: Two times a day (BID) | CUTANEOUS | Status: DC

## 2013-11-27 MED ORDER — LORATADINE 10 MG PO TABS: 10.0000 mg | ORAL_TABLET | Freq: Every day | ORAL | Status: DC

## 2013-11-27 NOTE — Assessment & Plan Note (Addendum)
Worse Start Xigduo [employer]

## 2013-11-27 NOTE — Assessment & Plan Note (Signed)
Control CBGs Labs PT

## 2013-11-27 NOTE — Patient Instructions (Addendum)
Take Xigduo XR one a day in place of MetforminDiabetes Meal Planning Guide The diabetes meal planning guide is a tool to help you plan your meals and snacks. It is important for people with diabetes to manage their blood glucose (sugar) levels. Choosing the right foods and the right amounts throughout your day will help control your blood glucose. Eating right can even help you improve your blood pressure and reach or maintain a healthy weight. CARBOHYDRATE COUNTING MADE EASY When you eat carbohydrates, they turn to sugar. This raises your blood glucose level. Counting carbohydrates can help you control this level so you feel better. When you plan your meals by counting carbohydrates, you can have more flexibility in what you eat and balance your medicine with your food intake. Carbohydrate counting simply means adding up the total amount of carbohydrate grams in your meals and snacks. Try to eat about the same amount at each meal. Foods with carbohydrates are listed below. Each portion below is 1 carbohydrate serving or 15 grams of carbohydrates. Ask your dietician how many grams of carbohydrates you should eat at each meal or snack. Grains and Starches  1 slice bread.   English muffin or hotdog/hamburger bun.   cup cold cereal (unsweetened).   cup cooked pasta or rice.   cup starchy vegetables (corn, potatoes, peas, beans, winter squash).  1 tortilla (6 inches).   bagel.  1 waffle or pancake (size of a CD).   cup cooked cereal.  4 to 6 small crackers. *Whole grain is recommended. Fruit  1 cup fresh unsweetened berries, melon, papaya, pineapple.  1 small fresh fruit.   banana or mango.   cup fruit juice (4 oz unsweetened).   cup canned fruit in natural juice or water.  2 tbs dried fruit.  12 to 15 grapes or cherries. Milk and Yogurt  1 cup fat-free or 1% milk.  1 cup soy milk.  6 oz light yogurt with sugar-free sweetener.  6 oz low-fat soy yogurt.  6 oz  plain yogurt. Vegetables  1 cup raw or  cup cooked is counted as 0 carbohydrates or a "free" food.  If you eat 3 or more servings at 1 meal, count them as 1 carbohydrate serving. Other Carbohydrates   oz chips or pretzels.   cup ice cream or frozen yogurt.   cup sherbet or sorbet.  2 inch square cake, no frosting.  1 tbs honey, sugar, jam, jelly, or syrup.  2 small cookies.  3 squares of graham crackers.  3 cups popcorn.  6 crackers.  1 cup broth-based soup.  Count 1 cup casserole or other mixed foods as 2 carbohydrate servings.  Foods with less than 20 calories in a serving may be counted as 0 carbohydrates or a "free" food. You may want to purchase a book or computer software that lists the carbohydrate gram counts of different foods. In addition, the nutrition facts panel on the labels of the foods you eat are a good source of this information. The label will tell you how big the serving size is and the total number of carbohydrate grams you will be eating per serving. Divide this number by 15 to obtain the number of carbohydrate servings in a portion. Remember, 1 carbohydrate serving equals 15 grams of carbohydrate. SERVING SIZES Measuring foods and serving sizes helps you make sure you are getting the right amount of food. The list below tells how big or small some common serving sizes are.  1 oz.........4 stacked  dice.  3 oz........Marland KitchenDeck of cards.  1 tsp.......Marland KitchenTip of little finger.  1 tbs......Marland KitchenMarland KitchenThumb.  2 tbs.......Marland KitchenGolf ball.   cup......Marland KitchenHalf of a fist.  1 cup.......Marland KitchenA fist. SAMPLE DIABETES MEAL PLAN Below is a sample meal plan that includes foods from the grain and starches, dairy, vegetable, fruit, and meat groups. A dietician can individualize a meal plan to fit your calorie needs and tell you the number of servings needed from each food group. However, controlling the total amount of carbohydrates in your meal or snack is more important than making  sure you include all of the food groups at every meal. You may interchange carbohydrate containing foods (dairy, starches, and fruits). The meal plan below is an example of a 2000 calorie diet using carbohydrate counting. This meal plan has 17 carbohydrate servings. Breakfast  1 cup oatmeal (2 carb servings).   cup light yogurt (1 carb serving).  1 cup blueberries (1 carb serving).   cup almonds. Snack  1 large apple (2 carb servings).  1 low-fat string cheese stick. Lunch  Chicken breast salad.  1 cup spinach.   cup chopped tomatoes.  2 oz chicken breast, sliced.  2 tbs low-fat [place] dressing.  12 whole-wheat crackers (2 carb servings).  12 to 15 grapes (1 carb serving).  1 cup low-fat milk (1 carb serving). Snack  1 cup carrots.   cup hummus (1 carb serving). Dinner  3 oz broiled salmon.  1 cup brown rice (3 carb servings). Snack  1  cups steamed broccoli (1 carb serving) drizzled with 1 tsp olive oil and lemon juice.  1 cup light pudding (2 carb servings). DIABETES MEAL PLANNING WORKSHEET Your dietician can use this worksheet to help you decide how many servings of foods and what types of foods are right for you.  BREAKFAST Food Group and Servings / Carb Servings Grain/Starches __________________________________ Dairy __________________________________________ Vegetable ______________________________________ Fruit ___________________________________________ Meat __________________________________________ Fat ____________________________________________ LUNCH Food Group and Servings / Carb Servings Grain/Starches ___________________________________ Dairy ___________________________________________ Fruit ____________________________________________ Meat ___________________________________________ Fat _____________________________________________ [name] Food Group and Servings / Carb Servings Grain/Starches  ___________________________________ Dairy ___________________________________________ Fruit ____________________________________________ Meat ___________________________________________ Fat _____________________________________________ SNACKS Food Group and Servings / Carb Servings Grain/Starches ___________________________________ Dairy ___________________________________________ Vegetable _______________________________________ Fruit ____________________________________________ Meat ___________________________________________ Fat _____________________________________________ DAILY TOTALS Starches _________________________ Vegetable ________________________ Fruit ____________________________ Dairy ____________________________ Meat ____________________________ Fat ______________________________ Document Released: 05/11/2005 Document Revised: 11/06/2011 Document Reviewed: 03/22/2009 [employer] Patient Information 2014 [employer]

## 2013-11-27 NOTE — Progress Notes (Signed)
   Subjective:    HPI  C/o rash flare-up C/o frequent urination - day/night x2 wks The patient presents for a follow-up of  chronic hypertension, chronic food allergies and eczema, type 2 diabetes controlled with medicines  C/o R leg weakness - worse in the past 6 mo  Wt Readings from Last 3 Encounters:  11/27/13 307 lb (139.254 kg)  09/02/13 309 lb 11.2 oz (140.479 kg)  05/01/13 312 lb (141.522 kg)   BP Readings from Last 3 Encounters:  11/27/13 130/90  09/02/13 148/98  05/01/13 146/100      Review of Systems  Constitutional: Negative for appetite change, fatigue and unexpected weight change.  HENT: Negative for congestion, nosebleeds, sneezing, sore throat and trouble swallowing.   Eyes: Negative for itching and visual disturbance.  Respiratory: Negative for cough.   Cardiovascular: Negative for chest pain, palpitations and leg swelling.  Gastrointestinal: Negative for nausea, diarrhea, blood in stool and abdominal distention.  Genitourinary: Negative for frequency and hematuria.  Musculoskeletal: Positive for arthralgias. Negative for back pain, gait problem, joint swelling and neck pain.  Skin: Positive for rash.  Neurological: Negative for dizziness, tremors, speech difficulty and weakness.  Psychiatric/Behavioral: Negative for sleep disturbance, dysphoric mood and agitation. The patient is not nervous/anxious.        Objective:   Physical Exam  Constitutional: He is oriented to person, place, and time. He appears well-developed. No distress.  NAD  HENT:  Mouth/Throat: Oropharynx is clear and moist.  Eyes: Conjunctivae are normal. Pupils are equal, round, and reactive to light.  Neck: Normal range of motion. No JVD present. No thyromegaly present.  Cardiovascular: Normal rate, regular rhythm, normal heart sounds and intact distal pulses.  Exam reveals no gallop and no friction rub.   No murmur heard. Pulmonary/Chest: Effort normal and breath sounds normal. No  respiratory distress. He has no wheezes. He has no rales. He exhibits no tenderness.  Abdominal: Soft. Bowel sounds are normal. He exhibits no distension and no mass. There is no tenderness. There is no rebound and no guarding.  Musculoskeletal: Normal range of motion. He exhibits tenderness. He exhibits no edema.  R knee - WNL. R thigh is smaller Knee extensors and hip flexors on R 5-/4  Lymphadenopathy:    He has no cervical adenopathy.  Neurological: He is alert and oriented to person, place, and time. He has normal reflexes. No cranial nerve deficit. He exhibits normal muscle tone. He displays a negative Romberg sign. Coordination and gait normal.  Skin: Skin is warm and dry. No rash noted. There is erythema.  Umbilical keloid scar w/irritated area, crusted. No stitch noted  Psychiatric: He has a normal mood and affect. His behavior is normal. Judgment and thought content normal.   Lab Results  Component Value Date   WBC 5.3 12/21/2010   HGB 13.9 12/21/2010   HCT 40.7 12/21/2010   PLT 191.0 12/21/2010   GLUCOSE 152* 10/27/2011   ALT 16 12/21/2010   AST 22 12/21/2010   NA 137 10/27/2011   K 4.3 10/27/2011   CL 100 10/27/2011   CREATININE 1.1 10/27/2011   BUN 12 10/27/2011   CO2 29 10/27/2011   TSH 0.49 10/27/2011   HGBA1C 7.0* 10/27/2011   MICROALBUR 4.4* 05/30/2010          Assessment & Plan:

## 2013-11-27 NOTE — Progress Notes (Signed)
Pre visit review using our clinic review tool, if applicable. No additional management support is needed unless otherwise documented below in the visit note. 

## 2013-11-30 ENCOUNTER — Encounter: Payer: Self-pay | Admitting: Internal Medicine

## 2013-12-01 ENCOUNTER — Telehealth: Payer: Self-pay | Admitting: *Deleted

## 2013-12-01 MED ORDER — CANAGLIFLOZIN-METFORMIN HCL 150-1000 MG PO TABS: ORAL_TABLET | ORAL | Status: DC

## 2013-12-01 NOTE — Telephone Encounter (Signed)
Ok - will change Thx

## 2013-12-01 NOTE — Telephone Encounter (Signed)
Sam, pharmacist called states [name] is not covered by insurance however Invokamet is covered.  Please advise

## 2013-12-09 ENCOUNTER — Other Ambulatory Visit: Payer: Self-pay

## 2013-12-09 NOTE — Telephone Encounter (Signed)
Ok. See Rx Thx 

## 2013-12-09 NOTE — Telephone Encounter (Signed)
The patient called hoping to get a new rx for the sample medication he has been taking.  He stated he tried to call the pharmacy, but was unable to get the medicine.

## 2013-12-10 ENCOUNTER — Telehealth: Payer: Self-pay | Admitting: Internal Medicine

## 2013-12-10 MED ORDER — CANAGLIFLOZIN-METFORMIN HCL 150-1000 MG PO TABS: ORAL_TABLET | ORAL | Status: DC

## 2013-12-10 NOTE — Telephone Encounter (Signed)
The pt called back and is hoping to get a response to the previous phone note  Callback - [phone]

## 2013-12-10 NOTE — Telephone Encounter (Signed)
Pt called stated that we call in the wrong medication (Invokamet) to [facility] but it is need to be [place]. Pt stated that Dr. Camila Li gave him sample and discount card when he was here last ov. Please check. Pt is going out of town tomorrow for two weeks and he is trying to get his done before he go. Pt is out of this med.

## 2013-12-11 NOTE — Telephone Encounter (Signed)
Per Dr. Camila Li. invokamet was call because insurance will cover for it. Dr. Camila Li recommend for pt to take this med. Pt is aware

## 2014-01-01 ENCOUNTER — Other Ambulatory Visit: Payer: Self-pay | Admitting: Internal Medicine

## 2014-01-06 ENCOUNTER — Ambulatory Visit: Payer: 59

## 2014-01-06 ENCOUNTER — Telehealth: Payer: Self-pay | Admitting: Internal Medicine

## 2014-01-06 NOTE — Telephone Encounter (Signed)
Rec'd from [facility] forward 1 page to Dr. Plotnikov °

## 2014-01-23 ENCOUNTER — Ambulatory Visit: Payer: 59 | Attending: Internal Medicine

## 2014-01-23 DIAGNOSIS — R29898 Other symptoms and signs involving the musculoskeletal system: Secondary | ICD-10-CM | POA: Diagnosis not present

## 2014-01-23 DIAGNOSIS — M25669 Stiffness of unspecified knee, not elsewhere classified: Secondary | ICD-10-CM | POA: Insufficient documentation

## 2014-01-23 DIAGNOSIS — IMO0001 Reserved for inherently not codable concepts without codable children: Secondary | ICD-10-CM | POA: Insufficient documentation

## 2014-02-05 ENCOUNTER — Ambulatory Visit: Payer: 59 | Admitting: Rehabilitation

## 2014-02-06 ENCOUNTER — Ambulatory Visit: Payer: 59 | Admitting: Internal Medicine

## 2014-02-11 ENCOUNTER — Ambulatory Visit: Payer: 59 | Attending: Internal Medicine | Admitting: Rehabilitation

## 2014-02-11 DIAGNOSIS — M25669 Stiffness of unspecified knee, not elsewhere classified: Secondary | ICD-10-CM | POA: Insufficient documentation

## 2014-02-11 DIAGNOSIS — R29898 Other symptoms and signs involving the musculoskeletal system: Secondary | ICD-10-CM | POA: Insufficient documentation

## 2014-02-11 DIAGNOSIS — IMO0001 Reserved for inherently not codable concepts without codable children: Secondary | ICD-10-CM | POA: Insufficient documentation

## 2014-02-25 ENCOUNTER — Encounter: Payer: Self-pay | Admitting: *Deleted

## 2014-02-25 ENCOUNTER — Telehealth: Payer: Self-pay | Admitting: *Deleted

## 2014-02-25 DIAGNOSIS — E119 Type 2 diabetes mellitus without complications: Secondary | ICD-10-CM

## 2014-02-25 NOTE — Telephone Encounter (Signed)
Patient coming in 02/26/14 for an appointment.  a1c was ordered.

## 2014-02-26 ENCOUNTER — Encounter: Payer: Self-pay | Admitting: Internal Medicine

## 2014-02-26 ENCOUNTER — Ambulatory Visit (INDEPENDENT_AMBULATORY_CARE_PROVIDER_SITE_OTHER): Payer: 59 | Admitting: Internal Medicine

## 2014-02-26 VITALS — BP 138/80 | HR 80 | Temp 98.7°F | Resp 16 | Wt 307.0 lb

## 2014-02-26 DIAGNOSIS — J3089 Other allergic rhinitis: Secondary | ICD-10-CM

## 2014-02-26 DIAGNOSIS — J309 Allergic rhinitis, unspecified: Secondary | ICD-10-CM

## 2014-02-26 DIAGNOSIS — J302 Other seasonal allergic rhinitis: Secondary | ICD-10-CM

## 2014-02-26 DIAGNOSIS — E119 Type 2 diabetes mellitus without complications: Secondary | ICD-10-CM

## 2014-02-26 DIAGNOSIS — R29898 Other symptoms and signs involving the musculoskeletal system: Secondary | ICD-10-CM

## 2014-02-26 DIAGNOSIS — S7411XS Injury of femoral nerve at hip and thigh level, right leg, sequela: Secondary | ICD-10-CM

## 2014-02-26 DIAGNOSIS — I10 Essential (primary) hypertension: Secondary | ICD-10-CM

## 2014-02-26 DIAGNOSIS — L309 Dermatitis, unspecified: Secondary | ICD-10-CM

## 2014-02-26 DIAGNOSIS — L259 Unspecified contact dermatitis, unspecified cause: Secondary | ICD-10-CM

## 2014-02-26 MED ORDER — METHYLPREDNISOLONE ACETATE 80 MG/ML IJ SUSP
80.0000 mg | Freq: Once | INTRAMUSCULAR | Status: AC
  Administered 2014-02-26: 80 mg via INTRAMUSCULAR

## 2014-02-26 NOTE — Progress Notes (Signed)
   Subjective:    HPI  C/o rash flare-up again  C/o frequent urination - day/night x2 wks The patient presents for a follow-up of  chronic hypertension, chronic food allergies and eczema, type 2 diabetes controlled with medicines  C/o R leg weakness - better   Wt Readings from Last 3 Encounters:  02/26/14 307 lb (139.254 kg)  11/27/13 307 lb (139.254 kg)  09/02/13 309 lb 11.2 oz (140.479 kg)   BP Readings from Last 3 Encounters:  02/26/14 138/80  11/27/13 130/90  09/02/13 148/98      Review of Systems  Constitutional: Negative for appetite change, fatigue and unexpected weight change.  HENT: Negative for congestion, nosebleeds, sneezing, sore throat and trouble swallowing.   Eyes: Negative for itching and visual disturbance.  Respiratory: Negative for cough.   Cardiovascular: Negative for chest pain, palpitations and leg swelling.  Gastrointestinal: Negative for nausea, diarrhea, blood in stool and abdominal distention.  Genitourinary: Negative for frequency and hematuria.  Musculoskeletal: Positive for arthralgias. Negative for back pain, gait problem, joint swelling and neck pain.  Skin: Positive for rash.  Neurological: Negative for dizziness, tremors, speech difficulty and weakness.  Psychiatric/Behavioral: Negative for sleep disturbance, dysphoric mood and agitation. The patient is not nervous/anxious.        Objective:   Physical Exam  Constitutional: He is oriented to person, place, and time. He appears well-developed. No distress.  NAD  HENT:  Mouth/Throat: Oropharynx is clear and moist.  Eyes: Conjunctivae are normal. Pupils are equal, round, and reactive to light.  Neck: Normal range of motion. No JVD present. No thyromegaly present.  Cardiovascular: Normal rate, regular rhythm, normal heart sounds and intact distal pulses.  Exam reveals no gallop and no friction rub.   No murmur heard. Pulmonary/Chest: Effort normal and breath sounds normal. No  respiratory distress. He has no wheezes. He has no rales. He exhibits no tenderness.  Abdominal: Soft. Bowel sounds are normal. He exhibits no distension and no mass. There is no tenderness. There is no rebound and no guarding.  Musculoskeletal: Normal range of motion. He exhibits tenderness. He exhibits no edema.  R knee - WNL. R thigh is smaller Knee extensors and hip flexors on R 5-/4  Lymphadenopathy:    He has no cervical adenopathy.  Neurological: He is alert and oriented to person, place, and time. He has normal reflexes. No cranial nerve deficit. He exhibits normal muscle tone. He displays a negative Romberg sign. Coordination and gait normal.  Skin: Skin is warm and dry. No rash noted. There is erythema.  Umbilical keloid scar w/irritated area, crusted. No stitch noted  Psychiatric: He has a normal mood and affect. His behavior is normal. Judgment and thought content normal.   Lab Results  Component Value Date   WBC 6.5 11/27/2013   HGB 15.0 11/27/2013   HCT 44.5 11/27/2013   PLT 220.0 11/27/2013   GLUCOSE 371* 11/27/2013   CHOL 193 11/27/2013   TRIG 83.0 11/27/2013   HDL 64.40 11/27/2013   LDLCALC 112* 11/27/2013   ALT 20 11/27/2013   AST 17 11/27/2013   NA 131* 11/27/2013   K 4.8 11/27/2013   CL 92* 11/27/2013   CREATININE 1.3 11/27/2013   BUN 21 11/27/2013   CO2 27 11/27/2013   TSH 0.63 11/27/2013   HGBA1C 15.7* 11/27/2013   MICROALBUR 4.4* 05/30/2010          Assessment & Plan:

## 2014-02-26 NOTE — Progress Notes (Signed)
Pre visit review using our clinic review tool, if applicable. No additional management support is needed unless otherwise documented below in the visit note. 

## 2014-03-02 ENCOUNTER — Ambulatory Visit (INDEPENDENT_AMBULATORY_CARE_PROVIDER_SITE_OTHER): Payer: 59 | Admitting: Family Medicine

## 2014-03-02 ENCOUNTER — Encounter: Payer: Self-pay | Admitting: Family Medicine

## 2014-03-02 VITALS — BP 130/86 | Temp 98.9°F | Ht 74.0 in | Wt 300.0 lb

## 2014-03-02 DIAGNOSIS — H00019 Hordeolum externum unspecified eye, unspecified eyelid: Secondary | ICD-10-CM

## 2014-03-02 DIAGNOSIS — H00016 Hordeolum externum left eye, unspecified eyelid: Secondary | ICD-10-CM

## 2014-03-02 NOTE — Progress Notes (Signed)
   Subjective:    Patient ID: [name], male    DOB: 05-28-1966, 48 y.o.   MRN: [id number]  HPI Here for 3 days of a tender stye on the left upper eyelid. He has been using warm compresses and some antibiotic ointment he has at home.    Review of Systems  Constitutional: Negative.   Eyes: Negative.        Objective:   Physical Exam  Constitutional: He appears well-developed and well-nourished.  Eyes: Conjunctivae are normal.  Left upper eyelid has a tender stye           Assessment & Plan:  The stye was lanced with a sterile needle and purulence was expressed. He will use the ointment as above

## 2014-03-02 NOTE — Progress Notes (Signed)
Pre visit review using our clinic review tool, if applicable. No additional management support is needed unless otherwise documented below in the visit note. 

## 2014-03-17 ENCOUNTER — Encounter: Payer: Self-pay | Admitting: Internal Medicine

## 2014-03-17 NOTE — Assessment & Plan Note (Signed)
Continue with current prescription therapy as reflected on the Med list.  

## 2014-03-17 NOTE — Assessment & Plan Note (Signed)
Worse Depo-medrol 80 mg IM 

## 2014-03-17 NOTE — Assessment & Plan Note (Signed)
Continue with current prescription therapy as reflected on the Med list. Labs  

## 2014-03-17 NOTE — Assessment & Plan Note (Signed)
7/15 better w/better DM control

## 2014-04-29 ENCOUNTER — Encounter: Payer: Self-pay | Admitting: Internal Medicine

## 2014-04-29 ENCOUNTER — Ambulatory Visit (INDEPENDENT_AMBULATORY_CARE_PROVIDER_SITE_OTHER): Payer: 59 | Admitting: Internal Medicine

## 2014-04-29 VITALS — BP 130/90 | Temp 98.3°F | Wt 309.0 lb

## 2014-04-29 DIAGNOSIS — J069 Acute upper respiratory infection, unspecified: Secondary | ICD-10-CM | POA: Insufficient documentation

## 2014-04-29 DIAGNOSIS — E119 Type 2 diabetes mellitus without complications: Secondary | ICD-10-CM

## 2014-04-29 DIAGNOSIS — L309 Dermatitis, unspecified: Secondary | ICD-10-CM

## 2014-04-29 DIAGNOSIS — L259 Unspecified contact dermatitis, unspecified cause: Secondary | ICD-10-CM

## 2014-04-29 MED ORDER — PROMETHAZINE-CODEINE 6.25-10 MG/5ML PO SYRP: 5.0000 mL | ORAL_SOLUTION | ORAL | Status: AC | PRN

## 2014-04-29 MED ORDER — AZITHROMYCIN 250 MG PO TABS: ORAL_TABLET | ORAL | Status: DC

## 2014-04-29 NOTE — Progress Notes (Signed)
Pre visit review using our clinic review tool, if applicable. No additional management support is needed unless otherwise documented below in the visit note. 

## 2014-04-29 NOTE — Assessment & Plan Note (Signed)
Continue with current prescription therapy as reflected on the Med list.  

## 2014-04-29 NOTE — Assessment & Plan Note (Signed)
Chronic  Risks associated with treatment noncompliance were discussed. Compliance was encouraged. Meds discussed Labs

## 2014-04-29 NOTE — Assessment & Plan Note (Signed)
Prom-cod syr Zpac if worse 

## 2014-07-01 ENCOUNTER — Other Ambulatory Visit (INDEPENDENT_AMBULATORY_CARE_PROVIDER_SITE_OTHER): Payer: 59

## 2014-07-01 ENCOUNTER — Encounter: Payer: Self-pay | Admitting: Internal Medicine

## 2014-07-01 ENCOUNTER — Ambulatory Visit (INDEPENDENT_AMBULATORY_CARE_PROVIDER_SITE_OTHER): Payer: 59 | Admitting: Internal Medicine

## 2014-07-01 ENCOUNTER — Ambulatory Visit: Payer: 59 | Admitting: Internal Medicine

## 2014-07-01 VITALS — BP 120/84 | HR 92 | Temp 98.3°F | Wt 311.0 lb

## 2014-07-01 DIAGNOSIS — H00016 Hordeolum externum left eye, unspecified eyelid: Secondary | ICD-10-CM

## 2014-07-01 DIAGNOSIS — H00019 Hordeolum externum unspecified eye, unspecified eyelid: Secondary | ICD-10-CM | POA: Insufficient documentation

## 2014-07-01 DIAGNOSIS — E119 Type 2 diabetes mellitus without complications: Secondary | ICD-10-CM

## 2014-07-01 DIAGNOSIS — S7411XS Injury of femoral nerve at hip and thigh level, right leg, sequela: Secondary | ICD-10-CM

## 2014-07-01 DIAGNOSIS — IMO0002 Reserved for concepts with insufficient information to code with codable children: Secondary | ICD-10-CM

## 2014-07-01 DIAGNOSIS — E1165 Type 2 diabetes mellitus with hyperglycemia: Secondary | ICD-10-CM

## 2014-07-01 LAB — BASIC METABOLIC PANEL
BUN: 21 mg/dL (ref 6–23)
CALCIUM: 10.1 mg/dL (ref 8.4–10.5)
CO2: 23 mEq/L (ref 19–32)
CREATININE: 1.5 mg/dL (ref 0.4–1.5)
Chloride: 103 mEq/L (ref 96–112)
GFR: 63.12 mL/min (ref >60.00)
Glucose, Bld: 115 mg/dL — ABNORMAL HIGH (ref 70–99)
Potassium: 4.6 mEq/L (ref 3.5–5.1)
SODIUM: 137 meq/L (ref 135–145)

## 2014-07-01 LAB — HEMOGLOBIN A1C: HEMOGLOBIN A1C: 7.8 % — AB (ref 4.6–6.5)

## 2014-07-01 MED ORDER — DOXYCYCLINE HYCLATE 100 MG PO TABS: 100.0000 mg | ORAL_TABLET | Freq: Two times a day (BID) | ORAL | Status: DC
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
## 2014-07-01 NOTE — Assessment & Plan Note (Signed)
Continue with current prescription therapy as reflected on the Med list. Labs  

## 2014-07-01 NOTE — Assessment & Plan Note (Signed)
11/15 L lower eyelid PO abx

## 2014-07-01 NOTE — Progress Notes (Signed)
   Subjective:    HPI  C/o L eye stye x 1 week  F/u frequent urination - day/night  The patient presents for a follow-up of  chronic hypertension, chronic food allergies and eczema, type 2 diabetes controlled with medicines  F/u R leg weakness - better   Wt Readings from Last 3 Encounters:  07/01/14 311 lb (141.069 kg)  04/29/14 309 lb (140.161 kg)  03/02/14 300 lb (136.079 kg)   BP Readings from Last 3 Encounters:  07/01/14 120/84  04/29/14 130/90  03/02/14 130/86      Review of Systems  Constitutional: Negative for appetite change, fatigue and unexpected weight change.  HENT: Negative for congestion, nosebleeds, sneezing, sore throat and trouble swallowing.   Eyes: Negative for itching and visual disturbance.  Respiratory: Negative for cough.   Cardiovascular: Negative for chest pain, palpitations and leg swelling.  Gastrointestinal: Negative for nausea, diarrhea, blood in stool and abdominal distention.  Genitourinary: Negative for frequency and hematuria.  Musculoskeletal: Positive for arthralgias. Negative for back pain, joint swelling, gait problem and neck pain.  Skin: Positive for rash.  Neurological: Negative for dizziness, tremors, speech difficulty and weakness.  Psychiatric/Behavioral: Negative for sleep disturbance, dysphoric mood and agitation. The patient is not nervous/anxious.        Objective:   Physical Exam  Constitutional: He is oriented to person, place, and time. He appears well-developed. No distress.  NAD  HENT:  Mouth/Throat: Oropharynx is clear and moist.  L lower lid stye  Eyes: Conjunctivae are normal. Pupils are equal, round, and reactive to light.  Neck: Normal range of motion. No JVD present. No thyromegaly present.  Cardiovascular: Normal rate, regular rhythm, normal heart sounds and intact distal pulses.  Exam reveals no gallop and no friction rub.   No murmur heard. Pulmonary/Chest: Effort normal and breath sounds normal. No  respiratory distress. He has no wheezes. He has no rales. He exhibits no tenderness.  Abdominal: Soft. Bowel sounds are normal. He exhibits no distension and no mass. There is no tenderness. There is no rebound and no guarding.  Musculoskeletal: Normal range of motion. He exhibits no edema or tenderness.  Lymphadenopathy:    He has no cervical adenopathy.  Neurological: He is alert and oriented to person, place, and time. He has normal reflexes. No cranial nerve deficit. He exhibits normal muscle tone. He displays a negative Romberg sign. Coordination and gait normal.  No meningeal signs  Skin: Skin is warm and dry. No rash noted.  Psychiatric: He has a normal mood and affect. His behavior is normal. Judgment and thought content normal.   Lab Results  Component Value Date   WBC 6.5 11/27/2013   HGB 15.0 11/27/2013   HCT 44.5 11/27/2013   PLT 220.0 11/27/2013   GLUCOSE 371* 11/27/2013   CHOL 193 11/27/2013   TRIG 83.0 11/27/2013   HDL 64.40 11/27/2013   LDLCALC 112* 11/27/2013   ALT 20 11/27/2013   AST 17 11/27/2013   NA 131* 11/27/2013   K 4.8 11/27/2013   CL 92* 11/27/2013   CREATININE 1.3 11/27/2013   BUN 21 11/27/2013   CO2 27 11/27/2013   TSH 0.63 11/27/2013   HGBA1C 15.7* 11/27/2013   MICROALBUR 4.4* 05/30/2010          Assessment & Plan:

## 2014-07-01 NOTE — Progress Notes (Signed)
Pre visit review using our clinic review tool, if applicable. No additional management support is needed unless otherwise documented below in the visit note. 

## 2014-07-30 ENCOUNTER — Other Ambulatory Visit: Payer: Self-pay | Admitting: Internal Medicine

## 2014-10-13 ENCOUNTER — Other Ambulatory Visit: Payer: Self-pay | Admitting: Internal Medicine

## 2014-10-14 ENCOUNTER — Other Ambulatory Visit: Payer: Self-pay | Admitting: Geriatric Medicine

## 2014-10-14 MED ORDER — LOSARTAN POTASSIUM 100 MG PO TABS: 100.0000 mg | ORAL_TABLET | Freq: Every day | ORAL | Status: DC

## 2014-12-19 ENCOUNTER — Other Ambulatory Visit: Payer: Self-pay | Admitting: Internal Medicine

## 2014-12-22 ENCOUNTER — Encounter: Payer: Self-pay | Admitting: Internal Medicine

## 2014-12-22 ENCOUNTER — Ambulatory Visit (INDEPENDENT_AMBULATORY_CARE_PROVIDER_SITE_OTHER): Payer: 59 | Admitting: Internal Medicine

## 2014-12-22 VITALS — BP 112/72 | HR 71 | Temp 98.7°F | Ht 74.0 in | Wt 306.8 lb

## 2014-12-22 DIAGNOSIS — L309 Dermatitis, unspecified: Secondary | ICD-10-CM | POA: Diagnosis not present

## 2014-12-22 DIAGNOSIS — IMO0002 Reserved for concepts with insufficient information to code with codable children: Secondary | ICD-10-CM

## 2014-12-22 DIAGNOSIS — I10 Essential (primary) hypertension: Secondary | ICD-10-CM

## 2014-12-22 DIAGNOSIS — E1165 Type 2 diabetes mellitus with hyperglycemia: Secondary | ICD-10-CM

## 2014-12-22 MED ORDER — METHYLPREDNISOLONE ACETATE 80 MG/ML IJ SUSP
80.0000 mg | Freq: Once | INTRAMUSCULAR | Status: AC
  Administered 2014-12-22: 80 mg via INTRAMUSCULAR

## 2014-12-22 MED ORDER — BETAMETHASONE DIPROPIONATE AUG 0.05 % EX CREA: TOPICAL_CREAM | Freq: Two times a day (BID) | CUTANEOUS | Status: DC

## 2014-12-22 NOTE — Progress Notes (Signed)
Pre visit review using our clinic review tool, if applicable. No additional management support is needed unless otherwise documented below in the visit note. 

## 2014-12-22 NOTE — Assessment & Plan Note (Signed)
Worse Depomedrol 80 mg Loratidine daily

## 2014-12-22 NOTE — Assessment & Plan Note (Signed)
Better  Labs 

## 2014-12-22 NOTE — Progress Notes (Signed)
   Subjective:    HPI  C/o rash flare-up again  F/u frequent urination - day/night x2 wks The patient presents for a follow-up of  chronic hypertension, chronic food allergies and eczema, type 2 diabetes controlled with medicines  F/u R leg weakness - better   Wt Readings from Last 3 Encounters:  12/22/14 306 lb 12 oz (139.141 kg)  07/01/14 311 lb (141.069 kg)  04/29/14 309 lb (140.161 kg)   BP Readings from Last 3 Encounters:  12/22/14 112/72  07/01/14 120/84  04/29/14 130/90      Review of Systems  Constitutional: Negative for appetite change, fatigue and unexpected weight change.  HENT: Negative for congestion, nosebleeds, sneezing, sore throat and trouble swallowing.   Eyes: Negative for itching and visual disturbance.  Respiratory: Negative for cough.   Cardiovascular: Negative for chest pain, palpitations and leg swelling.  Gastrointestinal: Negative for nausea, diarrhea, blood in stool and abdominal distention.  Genitourinary: Negative for frequency and hematuria.  Musculoskeletal: Positive for arthralgias. Negative for back pain, joint swelling, gait problem and neck pain.  Skin: Positive for rash.  Neurological: Negative for dizziness, tremors, speech difficulty and weakness.  Psychiatric/Behavioral: Negative for sleep disturbance, dysphoric mood and agitation. The patient is not nervous/anxious.        Objective:   Physical Exam  Constitutional: He is oriented to person, place, and time. He appears well-developed. No distress.  NAD  HENT:  Mouth/Throat: Oropharynx is clear and moist.  Eyes: Conjunctivae are normal. Pupils are equal, round, and reactive to light.  Neck: Normal range of motion. No JVD present. No thyromegaly present.  Cardiovascular: Normal rate, regular rhythm, normal heart sounds and intact distal pulses.  Exam reveals no gallop and no friction rub.   No murmur heard. Pulmonary/Chest: Effort normal and breath sounds normal. No respiratory  distress. He has no wheezes. He has no rales. He exhibits no tenderness.  Abdominal: Soft. Bowel sounds are normal. He exhibits no distension and no mass. There is no tenderness. There is no rebound and no guarding.  Musculoskeletal: Normal range of motion. He exhibits tenderness. He exhibits no edema.  R knee - WNL. R thigh is smaller - better Knee extensors and hip flexors on R 5-/5  Lymphadenopathy:    He has no cervical adenopathy.  Neurological: He is alert and oriented to person, place, and time. He has normal reflexes. No cranial nerve deficit. He exhibits normal muscle tone. He displays a negative Romberg sign. Coordination and gait normal.  Skin: Skin is warm and dry. No rash noted. There is erythema.  Umbilical keloid scar w/irritated area, crusted. No stitch noted  Psychiatric: He has a normal mood and affect. His behavior is normal. Judgment and thought content normal.   Lab Results  Component Value Date   WBC 6.5 11/27/2013   HGB 15.0 11/27/2013   HCT 44.5 11/27/2013   PLT 220.0 11/27/2013   GLUCOSE 115* 07/01/2014   CHOL 193 11/27/2013   TRIG 83.0 11/27/2013   HDL 64.40 11/27/2013   LDLCALC 112* 11/27/2013   ALT 20 11/27/2013   AST 17 11/27/2013   NA 137 07/01/2014   K 4.6 07/01/2014   CL 103 07/01/2014   CREATININE 1.5 07/01/2014   BUN 21 07/01/2014   CO2 23 07/01/2014   TSH 0.63 11/27/2013   HGBA1C 7.8* 07/01/2014   MICROALBUR 4.4* 05/30/2010          Assessment & Plan:

## 2014-12-22 NOTE — Patient Instructions (Addendum)
Use Melatonin as needed for sleep/travel/time zone change

## 2014-12-22 NOTE — Assessment & Plan Note (Signed)
On Losartan 

## 2015-02-26 ENCOUNTER — Other Ambulatory Visit: Payer: Self-pay | Admitting: *Deleted

## 2015-02-26 MED ORDER — LOSARTAN POTASSIUM 100 MG PO TABS: 100.0000 mg | ORAL_TABLET | Freq: Every day | ORAL | Status: DC

## 2015-05-04 ENCOUNTER — Telehealth: Payer: Self-pay | Admitting: Neurology

## 2015-05-04 NOTE — Telephone Encounter (Signed)
Araceli with [facility] is calling and states the patient has stopped taking Eliquis because of rectal bleeding.  Also, he would like to know if patient can go back to work.  Please call [phone].  Thanks!

## 2015-06-28 ENCOUNTER — Telehealth: Payer: Self-pay | Admitting: Internal Medicine

## 2015-06-28 NOTE — Telephone Encounter (Signed)
Patient is requesting to be seen regarding an insect bite on his leg. He refuses to see another provider. Advised availability on thurs, but he requests to be worked in on Wednesday. i advised that I would ask. Please advise.

## 2015-06-29 NOTE — Telephone Encounter (Signed)
OK OV on Wed Thx

## 2015-06-30 NOTE — Telephone Encounter (Signed)
Patient called and will not be able to make the appointment. He did not want to schedule another visit

## 2015-06-30 NOTE — Telephone Encounter (Signed)
Called pt no answer LMOM md can see him this am @ 11:15. Pls call back to confirm appt time is ok...Johny Chess

## 2015-08-08 ENCOUNTER — Other Ambulatory Visit: Payer: Self-pay | Admitting: Internal Medicine

## 2015-08-09 NOTE — Telephone Encounter (Signed)
Patient's last A1C was 07/01/14 resulting at 7.8, are you ok with refilling Invokamet before another A1C lab

## 2015-10-26 ENCOUNTER — Other Ambulatory Visit: Payer: Self-pay | Admitting: *Deleted

## 2015-10-26 MED ORDER — LOSARTAN POTASSIUM 100 MG PO TABS: 100.0000 mg | ORAL_TABLET | Freq: Every day | ORAL | Status: DC

## 2015-10-26 NOTE — Telephone Encounter (Signed)
Refill done.  

## 2015-12-15 ENCOUNTER — Ambulatory Visit (INDEPENDENT_AMBULATORY_CARE_PROVIDER_SITE_OTHER): Payer: 59 | Admitting: Internal Medicine

## 2015-12-15 ENCOUNTER — Encounter: Payer: Self-pay | Admitting: Internal Medicine

## 2015-12-15 VITALS — BP 140/90 | HR 92 | Wt 302.0 lb

## 2015-12-15 DIAGNOSIS — J302 Other seasonal allergic rhinitis: Secondary | ICD-10-CM

## 2015-12-15 DIAGNOSIS — E1100 Type 2 diabetes mellitus with hyperosmolarity without nonketotic hyperglycemic-hyperosmolar coma (NKHHC): Secondary | ICD-10-CM | POA: Diagnosis not present

## 2015-12-15 DIAGNOSIS — S7411XS Injury of femoral nerve at hip and thigh level, right leg, sequela: Secondary | ICD-10-CM

## 2015-12-15 DIAGNOSIS — L309 Dermatitis, unspecified: Secondary | ICD-10-CM | POA: Diagnosis not present

## 2015-12-15 DIAGNOSIS — I10 Essential (primary) hypertension: Secondary | ICD-10-CM

## 2015-12-15 DIAGNOSIS — J309 Allergic rhinitis, unspecified: Secondary | ICD-10-CM | POA: Diagnosis not present

## 2015-12-15 DIAGNOSIS — J3089 Other allergic rhinitis: Secondary | ICD-10-CM

## 2015-12-15 MED ORDER — LEVOCETIRIZINE DIHYDROCHLORIDE 5 MG PO TABS: 5.0000 mg | ORAL_TABLET | Freq: Every evening | ORAL | Status: DC

## 2015-12-15 MED ORDER — HALOBETASOL PROPIONATE 0.05 % EX CREA: TOPICAL_CREAM | Freq: Two times a day (BID) | CUTANEOUS | Status: DC

## 2015-12-15 MED ORDER — CEPHALEXIN 500 MG PO CAPS: 500.0000 mg | ORAL_CAPSULE | Freq: Four times a day (QID) | ORAL | Status: DC

## 2015-12-15 MED ORDER — METHYLPREDNISOLONE ACETATE 80 MG/ML IJ SUSP
80.0000 mg | Freq: Once | INTRAMUSCULAR | Status: AC
  Administered 2015-12-15: 80 mg via INTRAMUSCULAR

## 2015-12-15 NOTE — Assessment & Plan Note (Signed)
D/c Loratadine. Start Xyzal

## 2015-12-15 NOTE — Assessment & Plan Note (Signed)
Invokamet Labs

## 2015-12-15 NOTE — Assessment & Plan Note (Signed)
Diprolene cream Depo-medrol 80 mg im

## 2015-12-15 NOTE — Progress Notes (Signed)
Pre visit review using our clinic review tool, if applicable. No additional management support is needed unless otherwise documented below in the visit note. 

## 2015-12-15 NOTE — Progress Notes (Signed)
Subjective:  Patient ID: [name], male    DOB: 1965/11/28  Age: 50 y.o. MRN: [id number]  CC: No chief complaint on file.   HPI [name] presents for DM2, eczema, occ boils, R LE weakness f/u  Outpatient Prescriptions Prior to Visit  Medication Sig Dispense Refill  . cholecalciferol (VITAMIN D) 1000 UNITS tablet Take 1 tablet (1,000 Units total) by mouth daily. 100 tablet 3  . INVOKAMET [phone] MG TABS TAKE 1 TABLET BY MOUTH TWICE DAILY 60 tablet 11  . losartan (COZAAR) 100 MG tablet Take 1 tablet (100 mg total) by mouth daily. 90 tablet 0  . vitamin B-12 (CYANOCOBALAMIN) 1000 MCG tablet Take 1 tablet (1,000 mcg total) by mouth daily. 100 tablet 3  . INVOKAMET [phone] MG TABS TAKE 1 TABLET BY MOUTH TWICE DAILY 60 tablet 0  . augmented betamethasone dipropionate (DIPROLENE AF) 0.05 % cream Apply topically 2 (two) times daily. (Patient not taking: Reported on 12/15/2015) 100 g 3  . loratadine (CLARITIN) 10 MG tablet Take 1 tablet (10 mg total) by mouth daily. (Patient not taking: Reported on 12/15/2015) 100 tablet 3   No facility-administered medications prior to visit.    ROS Review of Systems  Constitutional: Negative for appetite change, fatigue and unexpected weight change.  HENT: Negative for congestion, nosebleeds, sneezing, sore throat and trouble swallowing.   Eyes: Negative for itching and visual disturbance.  Respiratory: Negative for cough.   Cardiovascular: Negative for chest pain, palpitations and leg swelling.  Gastrointestinal: Negative for nausea, diarrhea, blood in stool and abdominal distention.  Genitourinary: Negative for frequency and hematuria.  Musculoskeletal: Positive for gait problem. Negative for back pain, joint swelling and neck pain.  Skin: Positive for rash.  Neurological: Negative for dizziness, tremors, speech difficulty and weakness.  Psychiatric/Behavioral: Negative for sleep disturbance, dysphoric mood and agitation. The patient is not  nervous/anxious.     Objective:  BP 140/90 mmHg  Pulse 92  Wt 302 lb (136.986 kg)  SpO2 96%  BP Readings from Last 3 Encounters:  12/15/15 140/90  12/22/14 112/72  07/01/14 120/84    Wt Readings from Last 3 Encounters:  12/15/15 302 lb (136.986 kg)  12/22/14 306 lb 12 oz (139.141 kg)  07/01/14 311 lb (141.069 kg)    Physical Exam  Constitutional: He is oriented to person, place, and time. He appears well-developed. No distress.  NAD  HENT:  Mouth/Throat: Oropharynx is clear and moist.  Eyes: Conjunctivae are normal. Pupils are equal, round, and reactive to light.  Neck: Normal range of motion. No JVD present. No thyromegaly present.  Cardiovascular: Normal rate, regular rhythm, normal heart sounds and intact distal pulses.  Exam reveals no gallop and no friction rub.   No murmur heard. Pulmonary/Chest: Effort normal and breath sounds normal. No respiratory distress. He has no wheezes. He has no rales. He exhibits no tenderness.  Abdominal: Soft. Bowel sounds are normal. He exhibits no distension and no mass. There is no tenderness. There is no rebound and no guarding.  Musculoskeletal: Normal range of motion. He exhibits no edema or tenderness.  Lymphadenopathy:    He has no cervical adenopathy.  Neurological: He is alert and oriented to person, place, and time. He has normal reflexes. No cranial nerve deficit. He exhibits normal muscle tone. He displays a negative Romberg sign. Coordination abnormal. Gait normal.  Skin: Skin is warm and dry. Rash noted.  Psychiatric: He has a normal mood and affect. His behavior is normal. Judgment and  thought content normal.   Eczema Limp a little Lab Results  Component Value Date   WBC 6.5 11/27/2013   HGB 15.0 11/27/2013   HCT 44.5 11/27/2013   PLT 220.0 11/27/2013   GLUCOSE 115* 07/01/2014   CHOL 193 11/27/2013   TRIG 83.0 11/27/2013   HDL 64.40 11/27/2013   LDLCALC 112* 11/27/2013   ALT 20 11/27/2013   AST 17 11/27/2013    NA 137 07/01/2014   K 4.6 07/01/2014   CL 103 07/01/2014   CREATININE 1.5 07/01/2014   BUN 21 07/01/2014   CO2 23 07/01/2014   TSH 0.63 11/27/2013   HGBA1C 7.8* 07/01/2014   MICROALBUR 4.4* 05/30/2010    No results found.  Assessment & Plan:   There are no diagnoses linked to this encounter. I have discontinued Mr. Gasca's loratadine and augmented betamethasone dipropionate. I am also having him start on levocetirizine and halobetasol. Additionally, I am having him maintain his cholecalciferol, vitamin B-12, INVOKAMET, and losartan.  Meds ordered this encounter  Medications  . levocetirizine (XYZAL) 5 MG tablet    Sig: Take 1 tablet (5 mg total) by mouth every evening.    Dispense:  100 tablet    Refill:  3  . halobetasol (ULTRAVATE) 0.05 % cream    Sig: Apply topically 2 (two) times daily.    Dispense:  50 g    Refill:  4     Follow-up: Return in about 4 months (around 04/15/2016) for a follow-up visit.  Walker Kehr, MD

## 2015-12-15 NOTE — Addendum Note (Signed)
Addended by: Cresenciano Lick on: 12/15/2015 01:47 PM   Modules accepted: Orders

## 2015-12-15 NOTE — Assessment & Plan Note (Signed)
Chronic  Losartan 

## 2015-12-15 NOTE — Assessment & Plan Note (Signed)
PT ref Treat DM2

## 2016-01-11 ENCOUNTER — Ambulatory Visit: Payer: 59 | Attending: Internal Medicine | Admitting: Physical Therapy

## 2016-01-11 DIAGNOSIS — R262 Difficulty in walking, not elsewhere classified: Secondary | ICD-10-CM | POA: Diagnosis present

## 2016-01-11 DIAGNOSIS — R2689 Other abnormalities of gait and mobility: Secondary | ICD-10-CM | POA: Diagnosis not present

## 2016-01-11 DIAGNOSIS — M6281 Muscle weakness (generalized): Secondary | ICD-10-CM | POA: Insufficient documentation
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
## 2016-01-17 ENCOUNTER — Ambulatory Visit: Payer: 59 | Admitting: Physical Therapy

## 2016-01-17 DIAGNOSIS — R262 Difficulty in walking, not elsewhere classified: Secondary | ICD-10-CM

## 2016-01-17 DIAGNOSIS — M6281 Muscle weakness (generalized): Secondary | ICD-10-CM

## 2016-01-17 DIAGNOSIS — R2689 Other abnormalities of gait and mobility: Secondary | ICD-10-CM | POA: Diagnosis not present
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
## 2016-01-20 ENCOUNTER — Other Ambulatory Visit: Payer: Self-pay | Admitting: Internal Medicine

## 2016-01-21 ENCOUNTER — Ambulatory Visit: Payer: 59 | Admitting: Physical Therapy

## 2016-01-26 ENCOUNTER — Ambulatory Visit: Payer: 59

## 2016-02-02 ENCOUNTER — Ambulatory Visit: Payer: 59

## 2016-02-02 ENCOUNTER — Other Ambulatory Visit (INDEPENDENT_AMBULATORY_CARE_PROVIDER_SITE_OTHER): Payer: 59

## 2016-02-02 DIAGNOSIS — J309 Allergic rhinitis, unspecified: Secondary | ICD-10-CM

## 2016-02-02 DIAGNOSIS — I10 Essential (primary) hypertension: Secondary | ICD-10-CM | POA: Diagnosis not present

## 2016-02-02 DIAGNOSIS — J302 Other seasonal allergic rhinitis: Secondary | ICD-10-CM

## 2016-02-02 DIAGNOSIS — E1100 Type 2 diabetes mellitus with hyperosmolarity without nonketotic hyperglycemic-hyperosmolar coma (NKHHC): Secondary | ICD-10-CM | POA: Diagnosis not present

## 2016-02-02 DIAGNOSIS — L309 Dermatitis, unspecified: Secondary | ICD-10-CM | POA: Diagnosis not present

## 2016-02-02 DIAGNOSIS — J3089 Other allergic rhinitis: Secondary | ICD-10-CM

## 2016-02-02 LAB — HEPATIC FUNCTION PANEL
ALBUMIN: 4.6 g/dL (ref 3.5–5.2)
ALT: 17 U/L (ref 0–53)
AST: 18 U/L (ref 0–37)
Alkaline Phosphatase: 61 U/L (ref 39–117)
BILIRUBIN DIRECT: 0.1 mg/dL (ref 0.0–0.3)
TOTAL PROTEIN: 7.7 g/dL (ref 6.0–8.3)
Total Bilirubin: 0.7 mg/dL (ref 0.2–1.2)

## 2016-02-02 LAB — CBC WITH DIFFERENTIAL/PLATELET
BASOS ABS: 0 10*3/uL (ref 0.0–0.1)
BASOS PCT: 0.3 % (ref 0.0–3.0)
Eosinophils Absolute: 0.1 10*3/uL (ref 0.0–0.7)
Eosinophils Relative: 0.9 % (ref 0.0–5.0)
HEMATOCRIT: 44.7 % (ref 39.0–52.0)
HEMOGLOBIN: 14.9 g/dL (ref 13.0–17.0)
LYMPHS PCT: 59 % — AB (ref 12.0–46.0)
Lymphs Abs: 4.3 10*3/uL — ABNORMAL HIGH (ref 0.7–4.0)
MCHC: 33.3 g/dL (ref 30.0–36.0)
MCV: 92.6 fl (ref 78.0–100.0)
MONOS PCT: 7.6 % (ref 3.0–12.0)
Monocytes Absolute: 0.5 10*3/uL (ref 0.1–1.0)
NEUTROS ABS: 2.3 10*3/uL (ref 1.4–7.7)
Neutrophils Relative %: 32.2 % — ABNORMAL LOW (ref 43.0–77.0)
PLATELETS: 220 10*3/uL (ref 150.0–400.0)
RBC: 4.82 Mil/uL (ref 4.22–5.81)
RDW: 13.6 % (ref 11.5–15.5)
WBC: 7.2 10*3/uL (ref 4.0–10.5)

## 2016-02-02 LAB — VITAMIN D 25 HYDROXY (VIT D DEFICIENCY, FRACTURES): VITD: 45.7 ng/mL (ref 30.00–100.00)

## 2016-02-02 LAB — VITAMIN B12

## 2016-02-02 LAB — BASIC METABOLIC PANEL
BUN: 25 mg/dL — ABNORMAL HIGH (ref 6–23)
CALCIUM: 10.4 mg/dL (ref 8.4–10.5)
CO2: 29 mEq/L (ref 19–32)
Chloride: 103 mEq/L (ref 96–112)
Creatinine, Ser: 1.45 mg/dL (ref 0.40–1.50)
GFR: 66.7 mL/min (ref >60.00)
GLUCOSE: 126 mg/dL — AB (ref 70–99)
POTASSIUM: 4.7 meq/L (ref 3.5–5.1)
SODIUM: 139 meq/L (ref 135–145)

## 2016-02-02 LAB — PSA: PSA: 0.71 ng/mL (ref 0.10–4.00)

## 2016-02-02 LAB — HEMOGLOBIN A1C: Hgb A1c MFr Bld: 6.9 % — ABNORMAL HIGH (ref 4.6–6.5)

## 2016-02-02 LAB — TSH: TSH: 1.08 u[IU]/mL (ref 0.35–4.50)

## 2016-02-03 LAB — HEPATITIS C ANTIBODY: HCV Ab: NEGATIVE

## 2016-02-09 ENCOUNTER — Ambulatory Visit: Payer: 59 | Attending: Internal Medicine

## 2016-02-16 ENCOUNTER — Ambulatory Visit: Payer: 59

## 2016-03-20 ENCOUNTER — Telehealth: Payer: Self-pay | Admitting: Internal Medicine

## 2016-03-20 NOTE — Telephone Encounter (Signed)
Patient states it is time for a allergy shot.  He is requesting nurse visit.  Is this ok?

## 2016-03-20 NOTE — Telephone Encounter (Signed)
Patient has decided to make OV with [facility].  Please disregard.

## 2016-03-22 ENCOUNTER — Ambulatory Visit (INDEPENDENT_AMBULATORY_CARE_PROVIDER_SITE_OTHER): Payer: 59 | Admitting: Internal Medicine

## 2016-03-22 ENCOUNTER — Encounter: Payer: Self-pay | Admitting: Internal Medicine

## 2016-03-22 ENCOUNTER — Ambulatory Visit: Payer: 59 | Admitting: Internal Medicine

## 2016-03-22 DIAGNOSIS — L309 Dermatitis, unspecified: Secondary | ICD-10-CM

## 2016-03-22 DIAGNOSIS — I10 Essential (primary) hypertension: Secondary | ICD-10-CM | POA: Diagnosis not present

## 2016-03-22 DIAGNOSIS — Z3169 Encounter for other general counseling and advice on procreation: Secondary | ICD-10-CM | POA: Diagnosis not present

## 2016-03-22 DIAGNOSIS — E1100 Type 2 diabetes mellitus with hyperosmolarity without nonketotic hyperglycemic-hyperosmolar coma (NKHHC): Secondary | ICD-10-CM

## 2016-03-22 MED ORDER — METHYLPREDNISOLONE ACETATE 80 MG/ML IJ SUSP
80.0000 mg | Freq: Once | INTRAMUSCULAR | Status: AC
  Administered 2016-03-22: 80 mg via INTRAMUSCULAR

## 2016-03-22 MED ORDER — AMMONIUM LACTATE 12 % EX LOTN: 1.0000 "application " | TOPICAL_LOTION | CUTANEOUS | 3 refills | Status: DC | PRN

## 2016-03-22 NOTE — Progress Notes (Signed)
Subjective:  Patient ID: [name], male    DOB: 11-20-1965  Age: 50 y.o. MRN: [id number]  CC: Allergic Rhinitis  and Rash   HPI ODOM VALADE presents for a rash, DM2, HTN f/u. Needs fertility testing  Outpatient Medications Prior to Visit  Medication Sig Dispense Refill  . cephALEXin (KEFLEX) 500 MG capsule Take 1 capsule (500 mg total) by mouth 4 (four) times daily. 40 capsule 3  . cholecalciferol (VITAMIN D) 1000 UNITS tablet Take 1 tablet (1,000 Units total) by mouth daily. 100 tablet 3  . halobetasol (ULTRAVATE) 0.05 % cream Apply topically 2 (two) times daily. 50 g 4  . INVOKAMET [phone] MG TABS TAKE 1 TABLET BY MOUTH TWICE DAILY 60 tablet 11  . levocetirizine (XYZAL) 5 MG tablet Take 1 tablet (5 mg total) by mouth every evening. 100 tablet 3  . losartan (COZAAR) 100 MG tablet TAKE 1 TABLET(100 MG) BY MOUTH DAILY 90 tablet 3  . vitamin B-12 (CYANOCOBALAMIN) 1000 MCG tablet Take 1 tablet (1,000 mcg total) by mouth daily. 100 tablet 3   No facility-administered medications prior to visit.     ROS Review of Systems  Constitutional: Negative for appetite change, fatigue and unexpected weight change.  HENT: Negative for congestion, nosebleeds, sneezing, sore throat and trouble swallowing.   Eyes: Negative for itching and visual disturbance.  Respiratory: Negative for cough.   Cardiovascular: Negative for chest pain, palpitations and leg swelling.  Gastrointestinal: Negative for abdominal distention, blood in stool, diarrhea and nausea.  Genitourinary: Negative for frequency and hematuria.  Musculoskeletal: Negative for back pain, gait problem, joint swelling and neck pain.  Skin: Positive for rash.  Neurological: Negative for dizziness, tremors, speech difficulty and weakness.  Psychiatric/Behavioral: Negative for agitation, dysphoric mood and sleep disturbance. The patient is not nervous/anxious.   Eczema  Objective:  BP 120/80   Pulse 94   Temp 98.7 F (37.1 C)  (Oral)   Wt (!) 310 lb (140.6 kg)   SpO2 97%   BMI 39.80 kg/m   BP Readings from Last 3 Encounters:  03/22/16 120/80  12/15/15 140/90  12/22/14 112/72    Wt Readings from Last 3 Encounters:  03/22/16 (!) 310 lb (140.6 kg)  12/15/15 (!) 302 lb (137 kg)  12/22/14 (!) 306 lb 12 oz (139.1 kg)    Physical Exam  Constitutional: He is oriented to person, place, and time. He appears well-developed. No distress.  NAD  HENT:  Mouth/Throat: Oropharynx is clear and moist.  Eyes: Conjunctivae are normal. Pupils are equal, round, and reactive to light.  Neck: Normal range of motion. No JVD present. No thyromegaly present.  Cardiovascular: Normal rate, regular rhythm, normal heart sounds and intact distal pulses.  Exam reveals no gallop and no friction rub.   No murmur heard. Pulmonary/Chest: Effort normal and breath sounds normal. No respiratory distress. He has no wheezes. He has no rales. He exhibits no tenderness.  Abdominal: Soft. Bowel sounds are normal. He exhibits no distension and no mass. There is no tenderness. There is no rebound and no guarding.  Musculoskeletal: Normal range of motion. He exhibits no edema or tenderness.  Lymphadenopathy:    He has no cervical adenopathy.  Neurological: He is alert and oriented to person, place, and time. He has normal reflexes. No cranial nerve deficit. He exhibits normal muscle tone. He displays a negative Romberg sign. Coordination and gait normal.  Skin: Skin is warm and dry. Rash noted. There is erythema.  Psychiatric: He  has a normal mood and affect. His behavior is normal. Judgment and thought content normal.    Lab Results  Component Value Date   WBC 7.2 02/02/2016   HGB 14.9 02/02/2016   HCT 44.7 02/02/2016   PLT 220.0 02/02/2016   GLUCOSE 126 (H) 02/02/2016   CHOL 193 11/27/2013   TRIG 83.0 11/27/2013   HDL 64.40 11/27/2013   LDLCALC 112 (H) 11/27/2013   ALT 17 02/02/2016   AST 18 02/02/2016   NA 139 02/02/2016   K 4.7  02/02/2016   CL 103 02/02/2016   CREATININE 1.45 02/02/2016   BUN 25 (H) 02/02/2016   CO2 29 02/02/2016   TSH 1.08 02/02/2016   PSA 0.71 02/02/2016   HGBA1C 6.9 (H) 02/02/2016   MICROALBUR 4.4 (H) 05/30/2010    No results found.  Assessment & Plan:   There are no diagnoses linked to this encounter. I am having Mr. Novosad maintain his cholecalciferol, vitamin B-12, INVOKAMET, levocetirizine, halobetasol, cephALEXin, and losartan.  No orders of the defined types were placed in this encounter.    Follow-up: No Follow-up on file.  Walker Kehr, MD

## 2016-03-22 NOTE — Assessment & Plan Note (Signed)
Doing well Invokamet

## 2016-03-22 NOTE — Assessment & Plan Note (Signed)
Diprolene Ammonium 12%

## 2016-03-22 NOTE — Assessment & Plan Note (Signed)
Losartan 

## 2016-03-22 NOTE — Assessment & Plan Note (Signed)
Urology ref 

## 2016-03-22 NOTE — Progress Notes (Signed)
Pre visit review using our clinic review tool, if applicable. No additional management support is needed unless otherwise documented below in the visit note. 

## 2016-03-27 ENCOUNTER — Telehealth: Payer: Self-pay | Admitting: *Deleted
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
## 2016-03-28 ENCOUNTER — Ambulatory Visit: Payer: 59 | Admitting: Internal Medicine

## 2016-03-30 MED ORDER — EMPAGLIFLOZIN-METFORMIN HCL 5-1000 MG PO TABS: 1.0000 | ORAL_TABLET | Freq: Two times a day (BID) | ORAL | 11 refills | Status: DC

## 2016-03-30 NOTE — Telephone Encounter (Signed)
Mailed Rx and savings card to pt. Pt informed

## 2016-03-30 NOTE — Telephone Encounter (Signed)
Mail to the pt Synjardy Rx w/card Thx

## 2016-06-20 ENCOUNTER — Ambulatory Visit: Payer: 59 | Admitting: Internal Medicine

## 2016-12-12 ENCOUNTER — Encounter: Payer: Self-pay | Admitting: Internal Medicine

## 2016-12-12 ENCOUNTER — Ambulatory Visit (INDEPENDENT_AMBULATORY_CARE_PROVIDER_SITE_OTHER): Payer: 59 | Admitting: Internal Medicine

## 2016-12-12 VITALS — BP 152/98 | HR 88 | Temp 98.7°F | Ht 74.0 in | Wt 320.1 lb

## 2016-12-12 DIAGNOSIS — R29898 Other symptoms and signs involving the musculoskeletal system: Secondary | ICD-10-CM

## 2016-12-12 DIAGNOSIS — Z6839 Body mass index (BMI) 39.0-39.9, adult: Secondary | ICD-10-CM

## 2016-12-12 DIAGNOSIS — E1100 Type 2 diabetes mellitus with hyperosmolarity without nonketotic hyperglycemic-hyperosmolar coma (NKHHC): Secondary | ICD-10-CM | POA: Diagnosis not present

## 2016-12-12 DIAGNOSIS — I10 Essential (primary) hypertension: Secondary | ICD-10-CM

## 2016-12-12 MED ORDER — CEPHALEXIN 500 MG PO CAPS: 500.0000 mg | ORAL_CAPSULE | Freq: Four times a day (QID) | ORAL | 3 refills | Status: DC

## 2016-12-12 NOTE — Assessment & Plan Note (Signed)
Wt Readings from Last 3 Encounters:  12/12/16 (!) 320 lb 1.3 oz (145.2 kg)  03/22/16 (!) 310 lb (140.6 kg)  12/15/15 (!) 302 lb (137 kg)

## 2016-12-12 NOTE — Assessment & Plan Note (Addendum)
Labs Control DM2 Sports med ref - Dr Tamala Julian

## 2016-12-12 NOTE — Assessment & Plan Note (Signed)
On [employer]

## 2016-12-12 NOTE — Progress Notes (Signed)
Pre visit review using our clinic review tool, if applicable. No additional management support is needed unless otherwise documented below in the visit note. 

## 2016-12-12 NOTE — Assessment & Plan Note (Signed)
Losartan 

## 2016-12-12 NOTE — Progress Notes (Signed)
Subjective:  Patient ID: [name], male    DOB: 1966/08/28  Age: 51 y.o. MRN: [id number]  CC: No chief complaint on file.   HPI [name] presents for his R leg weakness. No pain. C/o occ thigh numbness. Can't run. Leg would buckle. F/u DM  Outpatient Medications Prior to Visit  Medication Sig Dispense Refill  . ammonium lactate (AMLACTIN) 12 % lotion Apply 1 application topically as needed for dry skin. 400 g 3  . cephALEXin (KEFLEX) 500 MG capsule Take 1 capsule (500 mg total) by mouth 4 (four) times daily. 40 capsule 3  . cholecalciferol (VITAMIN D) 1000 UNITS tablet Take 1 tablet (1,000 Units total) by mouth daily. 100 tablet 3  . Empagliflozin-Metformin HCl (SYNJARDY) 12-998 MG TABS Take 1 tablet by mouth 2 (two) times daily. 60 tablet 11  . halobetasol (ULTRAVATE) 0.05 % cream Apply topically 2 (two) times daily. 50 g 4  . levocetirizine (XYZAL) 5 MG tablet Take 1 tablet (5 mg total) by mouth every evening. 100 tablet 3  . losartan (COZAAR) 100 MG tablet TAKE 1 TABLET(100 MG) BY MOUTH DAILY 90 tablet 3  . vitamin B-12 (CYANOCOBALAMIN) 1000 MCG tablet Take 1 tablet (1,000 mcg total) by mouth daily. 100 tablet 3   No facility-administered medications prior to visit.     ROS Review of Systems  Constitutional: Negative for appetite change, fatigue and unexpected weight change.  HENT: Negative for congestion, nosebleeds, sneezing, sore throat and trouble swallowing.   Eyes: Negative for itching and visual disturbance.  Respiratory: Negative for cough.   Cardiovascular: Negative for chest pain, palpitations and leg swelling.  Gastrointestinal: Negative for abdominal distention, blood in stool, diarrhea and nausea.  Genitourinary: Negative for frequency and hematuria.  Musculoskeletal: Positive for gait problem. Negative for back pain, joint swelling and neck pain.  Skin: Negative for rash.  Neurological: Positive for numbness. Negative for dizziness, tremors, speech  difficulty and weakness.  Psychiatric/Behavioral: Negative for agitation, dysphoric mood and sleep disturbance. The patient is not nervous/anxious.     Objective:  BP (!) 152/98 (BP Location: Left Arm, Patient Position: Sitting, Cuff Size: Large)   Pulse 88   Temp 98.7 F (37.1 C) (Oral)   Ht 6\' 2"  (1.88 m)   Wt (!) 320 lb 1.3 oz (145.2 kg)   SpO2 98%   BMI 41.10 kg/m   BP Readings from Last 3 Encounters:  12/12/16 (!) 152/98  03/22/16 120/80  12/15/15 140/90    Wt Readings from Last 3 Encounters:  12/12/16 (!) 320 lb 1.3 oz (145.2 kg)  03/22/16 (!) 310 lb (140.6 kg)  12/15/15 (!) 302 lb (137 kg)    Physical Exam  Constitutional: He is oriented to person, place, and time. He appears well-developed. No distress.  NAD  HENT:  Mouth/Throat: Oropharynx is clear and moist.  Eyes: Conjunctivae are normal. Pupils are equal, round, and reactive to light.  Neck: Normal range of motion. No JVD present. No thyromegaly present.  Cardiovascular: Normal rate, regular rhythm, normal heart sounds and intact distal pulses.  Exam reveals no gallop and no friction rub.   No murmur heard. Pulmonary/Chest: Effort normal and breath sounds normal. No respiratory distress. He has no wheezes. He has no rales. He exhibits no tenderness.  Abdominal: Soft. Bowel sounds are normal. He exhibits no distension and no mass. There is no tenderness. There is no rebound and no guarding.  Musculoskeletal: Normal range of motion. He exhibits no edema or tenderness.  Lymphadenopathy:    He has no cervical adenopathy.  Neurological: He is alert and oriented to person, place, and time. He has normal reflexes. No cranial nerve deficit. He exhibits normal muscle tone. He displays a negative Romberg sign. Coordination and gait normal.  Skin: Skin is warm and dry. No rash noted.  Psychiatric: He has a normal mood and affect. His behavior is normal. Judgment and thought content normal.  RLE weak  Lab Results    Component Value Date   WBC 7.2 02/02/2016   HGB 14.9 02/02/2016   HCT 44.7 02/02/2016   PLT 220.0 02/02/2016   GLUCOSE 126 (H) 02/02/2016   CHOL 193 11/27/2013   TRIG 83.0 11/27/2013   HDL 64.40 11/27/2013   LDLCALC 112 (H) 11/27/2013   ALT 17 02/02/2016   AST 18 02/02/2016   NA 139 02/02/2016   K 4.7 02/02/2016   CL 103 02/02/2016   CREATININE 1.45 02/02/2016   BUN 25 (H) 02/02/2016   CO2 29 02/02/2016   TSH 1.08 02/02/2016   PSA 0.71 02/02/2016   HGBA1C 6.9 (H) 02/02/2016   MICROALBUR 4.4 (H) 05/30/2010    No results found.  Assessment & Plan:   There are no diagnoses linked to this encounter. I am having Mr. Eisemann maintain his cholecalciferol, vitamin B-12, levocetirizine, halobetasol, cephALEXin, losartan, ammonium lactate, and Empagliflozin-Metformin HCl.  No orders of the defined types were placed in this encounter.    Follow-up: No Follow-up on file.  Walker Kehr, MD
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
## 2016-12-14 ENCOUNTER — Ambulatory Visit (INDEPENDENT_AMBULATORY_CARE_PROVIDER_SITE_OTHER)
Admission: RE | Admit: 2016-12-14 | Discharge: 2016-12-14 | Disposition: A | Discharge status: Home / Self Care | Payer: 59 | Source: Ambulatory Visit | Attending: Family Medicine | Admitting: Family Medicine

## 2016-12-14 ENCOUNTER — Ambulatory Visit: Payer: Self-pay

## 2016-12-14 ENCOUNTER — Encounter: Payer: Self-pay | Admitting: Family Medicine

## 2016-12-14 ENCOUNTER — Ambulatory Visit (INDEPENDENT_AMBULATORY_CARE_PROVIDER_SITE_OTHER): Payer: 59 | Admitting: Family Medicine

## 2016-12-14 VITALS — BP 160/100 | HR 94 | Resp 16 | Wt 314.0 lb

## 2016-12-14 DIAGNOSIS — M79604 Pain in right leg: Secondary | ICD-10-CM | POA: Diagnosis not present

## 2016-12-14 DIAGNOSIS — R29898 Other symptoms and signs involving the musculoskeletal system: Secondary | ICD-10-CM

## 2016-12-14 DIAGNOSIS — R202 Paresthesia of skin: Secondary | ICD-10-CM | POA: Diagnosis not present

## 2016-12-14 MED ORDER — GABAPENTIN 100 MG PO CAPS: 200.0000 mg | ORAL_CAPSULE | Freq: Every day | ORAL | 3 refills | Status: DC

## 2016-12-14 MED ORDER — VITAMIN D (ERGOCALCIFEROL) 1.25 MG (50000 UNIT) PO CAPS: 50000.0000 [IU] | ORAL_CAPSULE | ORAL | 0 refills | Status: DC

## 2016-12-14 MED ORDER — PREDNISONE 50 MG PO TABS: 50.0000 mg | ORAL_TABLET | Freq: Every day | ORAL | 0 refills | Status: DC

## 2016-12-14 NOTE — Progress Notes (Signed)
Pre-visit discussion using our clinic review tool. No additional management support is needed unless otherwise documented below in the visit note.  

## 2016-12-14 NOTE — Patient Instructions (Addendum)
Good to see you  Ice 20 minutes 2 times daily. Usually after activity and before bed. Thigh compression daily  We will get EMG  Once weekly vitamin D for 12 weeks Gabapentin 200mg  at night Prednisone daily for 5 days  Over the counter  B12 1051mcg daily  B6 200mg  daily  See me again in 3 weeks.

## 2016-12-14 NOTE — Assessment & Plan Note (Signed)
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 Patient states 17 tingling and numbness is not constant. Patient does have weakness though and that is what is more concerning. Patient's at this point Exam today and fairly good amount of strength. The patient does have weakness when he walks. Full range of motion of the hip but we'll get x-rays to further evaluate. Differential does include lumbar radiculopathy and will consider. Patient will be started on gabapentin as well as given prednisone case there is any nerve impingement or irritation that can be contributing. We discussed over-the-counter medications a could be beneficial for her nerve health. 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 This would also help [place] evaluate for further need for further evaluation of the back. Patient is in agreement with plan and will follow-up with me again in 3-4 weeks.

## 2016-12-20 ENCOUNTER — Other Ambulatory Visit: Payer: Self-pay | Admitting: *Deleted

## 2016-12-20 DIAGNOSIS — R29898 Other symptoms and signs involving the musculoskeletal system: Secondary | ICD-10-CM

## 2016-12-26 ENCOUNTER — Ambulatory Visit (INDEPENDENT_AMBULATORY_CARE_PROVIDER_SITE_OTHER): Payer: 59 | Admitting: Neurology

## 2016-12-26 DIAGNOSIS — R29898 Other symptoms and signs involving the musculoskeletal system: Secondary | ICD-10-CM | POA: Diagnosis not present
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
## 2017-01-09 ENCOUNTER — Encounter: Payer: Self-pay | Admitting: Family Medicine

## 2017-01-09 ENCOUNTER — Other Ambulatory Visit (INDEPENDENT_AMBULATORY_CARE_PROVIDER_SITE_OTHER): Payer: 59

## 2017-01-09 ENCOUNTER — Ambulatory Visit (INDEPENDENT_AMBULATORY_CARE_PROVIDER_SITE_OTHER): Payer: 59 | Admitting: Family Medicine

## 2017-01-09 ENCOUNTER — Other Ambulatory Visit: Payer: Self-pay | Admitting: Family Medicine

## 2017-01-09 DIAGNOSIS — G629 Polyneuropathy, unspecified: Secondary | ICD-10-CM

## 2017-01-09 DIAGNOSIS — R29898 Other symptoms and signs involving the musculoskeletal system: Secondary | ICD-10-CM | POA: Diagnosis not present

## 2017-01-09 DIAGNOSIS — E1100 Type 2 diabetes mellitus with hyperosmolarity without nonketotic hyperglycemic-hyperosmolar coma (NKHHC): Secondary | ICD-10-CM | POA: Diagnosis not present

## 2017-01-09 LAB — CBC WITH DIFFERENTIAL/PLATELET
BASOS ABS: 0 10*3/uL (ref 0.0–0.1)
Basophils Relative: 0.6 % (ref 0.0–3.0)
Eosinophils Absolute: 0 10*3/uL (ref 0.0–0.7)
Eosinophils Relative: 0.7 % (ref 0.0–5.0)
HCT: 47.1 % (ref 39.0–52.0)
Hemoglobin: 15.7 g/dL (ref 13.0–17.0)
Lymphs Abs: 2.7 10*3/uL (ref 0.7–4.0)
MCHC: 33.3 g/dL (ref 30.0–36.0)
MCV: 92.9 fl (ref 78.0–100.0)
Monocytes Absolute: 0.3 10*3/uL (ref 0.1–1.0)
Monocytes Relative: 7 % (ref 3.0–12.0)
NEUTROS ABS: 1.4 10*3/uL (ref 1.4–7.7)
NEUTROS PCT: 31.1 % — AB (ref 43.0–77.0)
PLATELETS: 216 10*3/uL (ref 150.0–400.0)
RBC: 5.07 Mil/uL (ref 4.22–5.81)
RDW: 13.4 % (ref 11.5–15.5)
WBC: 4.5 10*3/uL (ref 4.0–10.5)

## 2017-01-09 LAB — BASIC METABOLIC PANEL
BUN: 27 mg/dL — ABNORMAL HIGH (ref 6–23)
CALCIUM: 10.6 mg/dL — AB (ref 8.4–10.5)
CO2: 28 meq/L (ref 19–32)
Chloride: 104 mEq/L (ref 96–112)
Creatinine, Ser: 1.58 mg/dL — ABNORMAL HIGH (ref 0.40–1.50)
GFR: 60.18 mL/min (ref >60.00)
GLUCOSE: 93 mg/dL (ref 70–99)
Potassium: 4.3 mEq/L (ref 3.5–5.1)
Sodium: 139 mEq/L (ref 135–145)

## 2017-01-09 LAB — HEPATIC FUNCTION PANEL
ALT: 20 U/L (ref 0–53)
AST: 21 U/L (ref 0–37)
Albumin: 5 g/dL (ref 3.5–5.2)
Alkaline Phosphatase: 40 U/L (ref 39–117)
BILIRUBIN TOTAL: 0.7 mg/dL (ref 0.2–1.2)
Bilirubin, Direct: 0.1 mg/dL (ref 0.0–0.3)
Total Protein: 8.4 g/dL — ABNORMAL HIGH (ref 6.0–8.3)

## 2017-01-09 LAB — TSH: TSH: 0.88 u[IU]/mL (ref 0.35–4.50)

## 2017-01-09 LAB — VITAMIN D 25 HYDROXY (VIT D DEFICIENCY, FRACTURES): VITD: 49.87 ng/mL (ref 30.00–100.00)

## 2017-01-09 LAB — VITAMIN B12

## 2017-01-09 LAB — HEMOGLOBIN A1C: Hgb A1c MFr Bld: 7.1 % — ABNORMAL HIGH (ref 4.6–6.5)

## 2017-01-09 LAB — SEDIMENTATION RATE: SED RATE: 17 mm/h — AB (ref 0–15)

## 2017-01-09 MED ORDER — VENLAFAXINE HCL ER 37.5 MG PO CP24: 37.5000 mg | ORAL_CAPSULE | Freq: Every day | ORAL | 1 refills | Status: DC

## 2017-01-09 NOTE — Patient Instructions (Signed)
Good to see you  Alvera Singh is your friend.  TENS unit could be great and do it for 10 minutes a day .  Check amazon for a cheap one.  Effexor 37.5 mg daily may help wake up the nerve and then at follo wup we will discuss increasing that and maybe getting you off the gabapentin or backing down.  OK to increase activity slowly Continue all other vitamins See me again in 4 weeks.
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
## 2017-01-09 NOTE — Assessment & Plan Note (Signed)
Tone to have more of a polyneuropathy. Seems to be more chronic overall. Likely secondary to more of the nerve injury. Started on Effexor. Continue all other medications. We discussed potential side effects. Follow-up again in 4-6 weeks. Spent  25 minutes with patient face-to-face and had greater than 50% of counseling including as described above in assessment and plan.

## 2017-01-09 NOTE — Telephone Encounter (Signed)
Refill done.  

## 2017-01-11 ENCOUNTER — Other Ambulatory Visit: Payer: Self-pay

## 2017-01-11 MED ORDER — LOSARTAN POTASSIUM 100 MG PO TABS: 100.0000 mg | ORAL_TABLET | Freq: Every day | ORAL | 3 refills | Status: DC

## 2017-01-11 NOTE — Telephone Encounter (Signed)
Rec'd fax from [facility] for Losartan. RX sent

## 2017-02-05 ENCOUNTER — Ambulatory Visit (INDEPENDENT_AMBULATORY_CARE_PROVIDER_SITE_OTHER): Payer: 59 | Admitting: Family Medicine

## 2017-02-05 ENCOUNTER — Encounter: Payer: Self-pay | Admitting: Family Medicine

## 2017-02-05 DIAGNOSIS — S7411XS Injury of femoral nerve at hip and thigh level, right leg, sequela: Secondary | ICD-10-CM

## 2017-02-05 MED ORDER — GABAPENTIN 100 MG PO CAPS: 200.0000 mg | ORAL_CAPSULE | Freq: Three times a day (TID) | ORAL | 3 refills | Status: DC

## 2017-02-05 MED ORDER — PREDNISONE 50 MG PO TABS: 50.0000 mg | ORAL_TABLET | Freq: Every day | ORAL | 0 refills | Status: DC

## 2017-02-05 NOTE — Assessment & Plan Note (Signed)
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 Still has some mild weakness. Has been some significant improvement. I do believe that patient can increase his gabapentin. Patient will try this. We discussed icing regimen, home exercises, which activities to do an which was to avoid. Follow-up in 6 weeks otherwise.
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
## 2017-02-05 NOTE — Patient Instructions (Signed)
Good to see you  Ice is your friend  I am glad we are making strides Gabapentin 1 pill in AM , 1 pill in PM, and 2-3 pills at night.  You can titrate up to 2 pills 3 times a day  Sent in prednisone again for you trip if needed.  See me again in 6 weeks to make sure we are doing well.

## 2017-03-20 ENCOUNTER — Ambulatory Visit: Payer: 59 | Admitting: Family Medicine

## 2017-03-22 ENCOUNTER — Other Ambulatory Visit: Payer: Self-pay | Admitting: Family Medicine

## 2017-03-22 ENCOUNTER — Encounter: Payer: Self-pay | Admitting: Family Medicine

## 2017-03-22 ENCOUNTER — Other Ambulatory Visit: Payer: Self-pay

## 2017-03-22 ENCOUNTER — Ambulatory Visit (INDEPENDENT_AMBULATORY_CARE_PROVIDER_SITE_OTHER): Payer: 59 | Admitting: Family Medicine

## 2017-03-22 VITALS — BP 132/82 | HR 89 | Ht 73.5 in | Wt 303.0 lb

## 2017-03-22 DIAGNOSIS — G629 Polyneuropathy, unspecified: Secondary | ICD-10-CM

## 2017-03-22 MED ORDER — PREDNISONE 50 MG PO TABS: 50.0000 mg | ORAL_TABLET | Freq: Every day | ORAL | 0 refills | Status: DC

## 2017-03-22 MED ORDER — VENLAFAXINE HCL ER 37.5 MG PO CP24: 75.0000 mg | ORAL_CAPSULE | Freq: Every day | ORAL | 1 refills | Status: DC

## 2017-03-22 MED ORDER — IBUPROFEN-FAMOTIDINE 800-26.6 MG PO TABS: 1.0000 | ORAL_TABLET | Freq: Two times a day (BID) | ORAL | 3 refills | Status: DC

## 2017-03-22 NOTE — Telephone Encounter (Signed)
Refill done.  

## 2017-03-22 NOTE — Assessment & Plan Note (Signed)
Patient doesn't polyneuropathy Patient will have increasing the Effexor. We discussed oral anti-inflammatories but will monitor because patient has had abdominal surgery previously. Continue the gabapentin 100 mg 3 times a day and 200 mg at night. Recheck B12 and CBC with differential secondary to patient having some atypical lymphocytes previously. Patient come back and see me again in 4-6 weeks.

## 2017-03-22 NOTE — Progress Notes (Unsigned)
duex
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
## 2017-03-22 NOTE — Patient Instructions (Addendum)
Good to see you  Increase effexor to 2 pills daily  Continue the gabapentin  Recheck a couple labs today  duexis up to 2 times a day, gave you some samples write me to tell me if it helps.  Prednsione refilled  Have a good trip  See me again when you return.

## 2017-05-03 ENCOUNTER — Ambulatory Visit: Payer: 59 | Admitting: Family Medicine

## 2017-05-08 DIAGNOSIS — E119 Type 2 diabetes mellitus without complications: Secondary | ICD-10-CM | POA: Diagnosis not present

## 2017-05-08 LAB — HM DIABETES EYE EXAM
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
## 2017-05-10 ENCOUNTER — Ambulatory Visit (INDEPENDENT_AMBULATORY_CARE_PROVIDER_SITE_OTHER): Payer: 59 | Admitting: Family Medicine

## 2017-05-10 ENCOUNTER — Ambulatory Visit (INDEPENDENT_AMBULATORY_CARE_PROVIDER_SITE_OTHER)
Admission: RE | Admit: 2017-05-10 | Discharge: 2017-05-10 | Disposition: A | Discharge status: Home / Self Care | Payer: 59 | Source: Ambulatory Visit | Attending: Family Medicine | Admitting: Family Medicine

## 2017-05-10 ENCOUNTER — Encounter: Payer: Self-pay | Admitting: Family Medicine

## 2017-05-10 ENCOUNTER — Telehealth: Payer: Self-pay | Admitting: Family Medicine

## 2017-05-10 VITALS — BP 150/90 | HR 76 | Ht 73.5 in | Wt 301.0 lb

## 2017-05-10 DIAGNOSIS — R29898 Other symptoms and signs involving the musculoskeletal system: Secondary | ICD-10-CM

## 2017-05-10 DIAGNOSIS — M545 Low back pain: Secondary | ICD-10-CM | POA: Diagnosis not present

## 2017-05-10 MED ORDER — AMBULATORY NON FORMULARY MEDICATION: 0 refills | Status: DC

## 2017-05-10 NOTE — Assessment & Plan Note (Signed)
Patient seems to be doing somewhat better at this time but does have some polyneuropathy. I do not want to increase the gabapentin at this time. We will keep the Effexor at the same dose as well at 75 mg. Hopefully patient will continue to improve. I do believe that x-rays of the back are necessary. Possible need for an epidural. Patient will also be fitted for an AFO secondary to foot drop. Patient will come back and see me again in 4 weeks to see how he is doing.

## 2017-05-10 NOTE — Telephone Encounter (Signed)
Spoke to pt, faxed rx to [facility].

## 2017-05-10 NOTE — Telephone Encounter (Signed)
Patient states he was told to call [facility].  States he needs a script for  AFO to take to appt tomorrow.  Please follow up in regard.

## 2017-05-10 NOTE — Patient Instructions (Addendum)
Good to see you  We will get you an ankle brace called an AFO for you.   [phone] and give them a call and tell them you need a AFO In the meantime consider heel lift in the shoe near the toe.  Look at [email or web address]  TENs unit on amazon could help COntinue all the vitamins.  Conitntue the effexor Xray of your back today just to make sure.  See me again in 6 weeks.

## 2017-05-17 ENCOUNTER — Encounter: Payer: Self-pay | Admitting: Internal Medicine

## 2017-05-17 ENCOUNTER — Telehealth: Payer: Self-pay | Admitting: Family Medicine

## 2017-05-17 MED ORDER — PREDNISONE 50 MG PO TABS: ORAL_TABLET | ORAL | 0 refills | Status: DC

## 2017-05-17 NOTE — Telephone Encounter (Signed)
Per dr Tamala Julian. Okay to send into pharmacy.

## 2017-05-17 NOTE — Telephone Encounter (Signed)
PredniSONE 50 mg Oral Daily  [facility] - [place], [place] - [address] [phone] (Phone) [phone] (Fax   Patient is requesting a refill on this medication from [facility].

## 2017-06-07 ENCOUNTER — Other Ambulatory Visit: Payer: Self-pay | Admitting: Family Medicine

## 2017-06-07 NOTE — Telephone Encounter (Signed)
Refill done.  

## 2017-06-13 ENCOUNTER — Telehealth: Payer: Self-pay | Admitting: Internal Medicine

## 2017-06-13 MED ORDER — EMPAGLIFLOZIN-METFORMIN HCL 5-1000 MG PO TABS: 1.0000 | ORAL_TABLET | Freq: Two times a day (BID) | ORAL | 0 refills | Status: DC

## 2017-06-13 NOTE — Telephone Encounter (Signed)
Sent 30 day only to local [facility] pt is overdue for f/u appt w/PCP...[name]

## 2017-06-13 NOTE — Telephone Encounter (Signed)
Pt called for a refill of his  Empagliflozin-Metformin HCl (SYNJARDY) 12-998 MG TABS  Please advise
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
## 2017-06-21 ENCOUNTER — Encounter: Payer: Self-pay | Admitting: Family Medicine

## 2017-06-21 ENCOUNTER — Ambulatory Visit (INDEPENDENT_AMBULATORY_CARE_PROVIDER_SITE_OTHER): Payer: 59 | Admitting: Family Medicine

## 2017-06-21 DIAGNOSIS — G629 Polyneuropathy, unspecified: Secondary | ICD-10-CM | POA: Diagnosis not present

## 2017-06-21 MED ORDER — VENLAFAXINE HCL ER 150 MG PO CP24: 150.0000 mg | ORAL_CAPSULE | Freq: Every day | ORAL | 3 refills | Status: DC

## 2017-06-21 NOTE — Assessment & Plan Note (Signed)
Patient will continue with the conservative therapy but we'll increase Effexor 150mg  daily COntinue the gabapentin  RTC in 8 weeks.

## 2017-06-21 NOTE — Patient Instructions (Signed)
Good to see you  Effexor now 150 mg daily  I will also have you try DHEA 50-100mg  daily for 4 weeks then 2 weeks off and can repeat  Continue the vitamin D See me again in 6-8 weeks.

## 2017-07-16 ENCOUNTER — Telehealth: Payer: Self-pay | Admitting: Internal Medicine

## 2017-07-16 MED ORDER — EMPAGLIFLOZIN-METFORMIN HCL 5-1000 MG PO TABS: 1.0000 | ORAL_TABLET | Freq: Two times a day (BID) | ORAL | 0 refills | Status: DC

## 2017-07-16 NOTE — Telephone Encounter (Signed)
RX sent

## 2017-07-16 NOTE — Telephone Encounter (Signed)
Patient states he is overseas.  States he will make appt to come back in once he is in town.  States he is out of synjardy.  Is requesting this script to be sent to [facility].

## 2017-07-23 MED ORDER — DAPAGLIFLOZIN PRO-METFORMIN ER 5-1000 MG PO TB24: 1.0000 | ORAL_TABLET | Freq: Two times a day (BID) | ORAL | 11 refills | Status: DC

## 2017-07-23 NOTE — Telephone Encounter (Signed)
Xigduo XR 12/998 bid Pick up Rx and card and a free month coupon Thx

## 2017-07-23 NOTE — Addendum Note (Signed)
Addended by: Cassandria Anger on: 07/23/2017 04:53 PM   Modules accepted: Orders

## 2017-07-23 NOTE — Telephone Encounter (Signed)
Rec'd fax stating [name] is on backorder, please advise about alternative.

## 2017-07-24 NOTE — Telephone Encounter (Signed)
Pt notified and states he is good for a month but will call back if they are still are on backorder and needs RX

## 2017-07-31 ENCOUNTER — Telehealth: Payer: Self-pay | Admitting: Internal Medicine

## 2017-07-31 NOTE — Telephone Encounter (Signed)
Pt   Called   Not   Having  Any  Symptoms   Just  Wanted insurance  Information  And  Advice  On   Coverage   On  Certain tiers  With  [employer]. Pt  Advised  To  Look  At  My  Chart   Call  [place] [place]  Options  On  The  specefic terms  Of   His  Coverage .

## 2017-08-16 ENCOUNTER — Ambulatory Visit: Payer: 59 | Admitting: Family Medicine

## 2017-08-20 ENCOUNTER — Ambulatory Visit (INDEPENDENT_AMBULATORY_CARE_PROVIDER_SITE_OTHER): Payer: 59 | Admitting: Internal Medicine

## 2017-08-20 ENCOUNTER — Encounter: Payer: Self-pay | Admitting: Internal Medicine

## 2017-08-20 VITALS — BP 130/86 | HR 82 | Temp 97.8°F | Ht 73.0 in | Wt 303.0 lb

## 2017-08-20 DIAGNOSIS — E1165 Type 2 diabetes mellitus with hyperglycemia: Secondary | ICD-10-CM

## 2017-08-20 DIAGNOSIS — R29898 Other symptoms and signs involving the musculoskeletal system: Secondary | ICD-10-CM | POA: Diagnosis not present

## 2017-08-20 DIAGNOSIS — Z91018 Allergy to other foods: Secondary | ICD-10-CM | POA: Diagnosis not present

## 2017-08-20 DIAGNOSIS — L309 Dermatitis, unspecified: Secondary | ICD-10-CM | POA: Diagnosis not present

## 2017-08-20 DIAGNOSIS — Z0000 Encounter for general adult medical examination without abnormal findings: Secondary | ICD-10-CM | POA: Diagnosis not present

## 2017-08-20 MED ORDER — EMPAGLIFLOZIN-METFORMIN HCL 5-1000 MG PO TABS: 1.0000 | ORAL_TABLET | Freq: Two times a day (BID) | ORAL | 3 refills | Status: DC

## 2017-08-20 NOTE — Assessment & Plan Note (Signed)
F/u w/Dr Smith 

## 2017-08-20 NOTE — Assessment & Plan Note (Signed)
Better  
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
## 2017-08-20 NOTE — Assessment & Plan Note (Signed)
Better for ?reason

## 2017-08-20 NOTE — Assessment & Plan Note (Signed)
Chronic  Risks associated with treatment noncompliance were discussed. Compliance was encouraged. [employer] 

## 2017-08-22 DIAGNOSIS — Z1211 Encounter for screening for malignant neoplasm of colon: Secondary | ICD-10-CM | POA: Insufficient documentation

## 2017-08-22 DIAGNOSIS — Z0000 Encounter for general adult medical examination without abnormal findings: Secondary | ICD-10-CM | POA: Insufficient documentation

## 2017-08-22 NOTE — Assessment & Plan Note (Signed)
We discussed age appropriate health related issues, including available/recomended screening tests and vaccinations. We discussed a need for adhering to healthy diet and exercise. Labs were ordered to be later reviewed . All questions were answered.   
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
## 2017-09-11 ENCOUNTER — Ambulatory Visit: Payer: 59 | Admitting: Family Medicine

## 2017-09-11 ENCOUNTER — Ambulatory Visit: Payer: Self-pay

## 2017-09-11 ENCOUNTER — Encounter: Payer: Self-pay | Admitting: Family Medicine

## 2017-09-11 VITALS — BP 144/90 | HR 70 | Ht 73.0 in | Wt 304.0 lb

## 2017-09-11 DIAGNOSIS — G629 Polyneuropathy, unspecified: Secondary | ICD-10-CM

## 2017-09-11 DIAGNOSIS — R29898 Other symptoms and signs involving the musculoskeletal system: Secondary | ICD-10-CM

## 2017-09-11 DIAGNOSIS — M7061 Trochanteric bursitis, right hip: Secondary | ICD-10-CM | POA: Insufficient documentation

## 2017-09-11 DIAGNOSIS — M25551 Pain in right hip: Secondary | ICD-10-CM

## 2017-09-11 MED ORDER — PREDNISONE 50 MG PO TABS: ORAL_TABLET | ORAL | 0 refills | Status: DC

## 2017-09-11 NOTE — Assessment & Plan Note (Addendum)
Stable No changes 

## 2017-09-11 NOTE — Assessment & Plan Note (Signed)
Injected today.  Home exercises given, icing regimen, discussed which activities to do which wants to avoid.  Hopefully this will be beneficial.  Will be traveling out of the country and patient given prednisone again.  Patient will come back and see me again in 3-6 months.

## 2017-09-11 NOTE — Assessment & Plan Note (Signed)
Continue the same at this moment.  No change in medications.  Doing well with the low-dose gabapentin as well as the Effexor.

## 2017-09-11 NOTE — Patient Instructions (Addendum)
Good to see you  You are doing great, proud of you  Injected the hip today for some relief Try the uloric daily for 7 days, if better write me and we will try another medicine daily then  Refilled the prednisone for your trip pennsaid pinkie amount topically 2 times daily as needed.   See me again in 3-6 months for a check in

## 2017-09-13 ENCOUNTER — Other Ambulatory Visit: Payer: Self-pay | Admitting: Family Medicine

## 2017-09-17 NOTE — Telephone Encounter (Signed)
Refill denied. Pt has completed course of treatment.  

## 2017-09-20 ENCOUNTER — Other Ambulatory Visit: Payer: Self-pay | Admitting: Family Medicine
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
## 2017-10-01 ENCOUNTER — Encounter: Payer: Self-pay | Admitting: Family Medicine

## 2017-10-01 ENCOUNTER — Ambulatory Visit: Payer: 59 | Admitting: Family Medicine

## 2017-10-01 ENCOUNTER — Ambulatory Visit: Payer: Self-pay

## 2017-10-01 ENCOUNTER — Other Ambulatory Visit (INDEPENDENT_AMBULATORY_CARE_PROVIDER_SITE_OTHER): Payer: 59

## 2017-10-01 ENCOUNTER — Other Ambulatory Visit: Payer: Self-pay | Admitting: Internal Medicine

## 2017-10-01 VITALS — BP 108/72 | HR 99 | Ht 73.0 in | Wt 298.0 lb

## 2017-10-01 DIAGNOSIS — E1165 Type 2 diabetes mellitus with hyperglycemia: Secondary | ICD-10-CM | POA: Diagnosis not present

## 2017-10-01 DIAGNOSIS — M62838 Other muscle spasm: Secondary | ICD-10-CM | POA: Diagnosis not present

## 2017-10-01 DIAGNOSIS — Z0000 Encounter for general adult medical examination without abnormal findings: Secondary | ICD-10-CM

## 2017-10-01 DIAGNOSIS — M25551 Pain in right hip: Secondary | ICD-10-CM

## 2017-10-01 DIAGNOSIS — R29898 Other symptoms and signs involving the musculoskeletal system: Secondary | ICD-10-CM

## 2017-10-01 DIAGNOSIS — Z125 Encounter for screening for malignant neoplasm of prostate: Secondary | ICD-10-CM | POA: Diagnosis not present

## 2017-10-01 DIAGNOSIS — R7989 Other specified abnormal findings of blood chemistry: Secondary | ICD-10-CM

## 2017-10-01 LAB — MICROALBUMIN / CREATININE URINE RATIO
Creatinine,U: 127.2 mg/dL
MICROALB/CREAT RATIO: 3.9 mg/g (ref 0.0–30.0)
Microalb, Ur: 4.9 mg/dL — ABNORMAL HIGH (ref 0.0–1.9)

## 2017-10-01 LAB — CBC WITH DIFFERENTIAL/PLATELET
BASOS ABS: 0 10*3/uL (ref 0.0–0.1)
Basophils Relative: 0.4 % (ref 0.0–3.0)
Eosinophils Absolute: 0.1 10*3/uL (ref 0.0–0.7)
Eosinophils Relative: 1.1 % (ref 0.0–5.0)
HCT: 43.8 % (ref 39.0–52.0)
Hemoglobin: 14.7 g/dL (ref 13.0–17.0)
LYMPHS ABS: 3.2 10*3/uL (ref 0.7–4.0)
Lymphocytes Relative: 45.8 % (ref 12.0–46.0)
MCHC: 33.5 g/dL (ref 30.0–36.0)
MCV: 94.5 fl (ref 78.0–100.0)
MONOS PCT: 6.9 % (ref 3.0–12.0)
Monocytes Absolute: 0.5 10*3/uL (ref 0.1–1.0)
NEUTROS PCT: 45.8 % (ref 43.0–77.0)
Neutro Abs: 3.2 10*3/uL (ref 1.4–7.7)
Platelets: 191 10*3/uL (ref 150.0–400.0)
RBC: 4.64 Mil/uL (ref 4.22–5.81)
RDW: 13.2 % (ref 11.5–15.5)
WBC: 7 10*3/uL (ref 4.0–10.5)

## 2017-10-01 LAB — BASIC METABOLIC PANEL
BUN: 39 mg/dL — ABNORMAL HIGH (ref 6–23)
CHLORIDE: 103 meq/L (ref 96–112)
CO2: 29 meq/L (ref 19–32)
Calcium: 9.8 mg/dL (ref 8.4–10.5)
Creatinine, Ser: 2.16 mg/dL — ABNORMAL HIGH (ref 0.40–1.50)
GFR: 41.82 mL/min — ABNORMAL LOW (ref >60.00)
Glucose, Bld: 126 mg/dL — ABNORMAL HIGH (ref 70–99)
Potassium: 5.1 mEq/L (ref 3.5–5.1)
SODIUM: 140 meq/L (ref 135–145)

## 2017-10-01 LAB — HEMOGLOBIN A1C: HEMOGLOBIN A1C: 6.5 % (ref 4.6–6.5)

## 2017-10-01 LAB — HEPATIC FUNCTION PANEL
ALBUMIN: 4.5 g/dL (ref 3.5–5.2)
ALK PHOS: 54 U/L (ref 39–117)
ALT: 19 U/L (ref 0–53)
AST: 18 U/L (ref 0–37)
Bilirubin, Direct: 0.1 mg/dL (ref 0.0–0.3)
Total Bilirubin: 0.5 mg/dL (ref 0.2–1.2)
Total Protein: 7.3 g/dL (ref 6.0–8.3)

## 2017-10-01 LAB — URINALYSIS, ROUTINE W REFLEX MICROSCOPIC
BILIRUBIN URINE: NEGATIVE
KETONES UR: NEGATIVE
Leukocytes, UA: NEGATIVE
Nitrite: NEGATIVE
Specific Gravity, Urine: 1.025 (ref 1.000–1.030)
UROBILINOGEN UA: 0.2 (ref 0.0–1.0)
Urine Glucose: >=1000 — AB
WBC UA: NONE SEEN (ref 0–?)
pH: 5.5 (ref 5.0–8.0)

## 2017-10-01 LAB — PSA: PSA: 0.97 ng/mL (ref 0.10–4.00)

## 2017-10-01 LAB — VITAMIN D 25 HYDROXY (VIT D DEFICIENCY, FRACTURES): VITD: 81.18 ng/mL (ref 30.00–100.00)

## 2017-10-01 LAB — TSH: TSH: 1.21 u[IU]/mL (ref 0.35–4.50)

## 2017-10-01 LAB — VITAMIN B12: Vitamin B-12: >1500 pg/mL — ABNORMAL HIGH (ref 211–911)

## 2017-10-01 NOTE — Patient Instructions (Addendum)
Happy birthday  [name] is your friend.  Injected the piriformis.  See me again in 4-6 weeks

## 2017-10-01 NOTE — Assessment & Plan Note (Signed)
Right piriformis injected for therapeutic and diagnostic. Patient did well. HEP given and should do well.  Differential includes a lumbar radiculopathy.  We discussed home exercises.

## 2017-10-02 ENCOUNTER — Telehealth: Payer: Self-pay | Admitting: Internal Medicine

## 2017-10-02 NOTE — Telephone Encounter (Signed)
Copied from [place] [id number]. Topic: Inquiry >> Oct 02, 2017  9:12 AM [name], NT wrote: Reason for CRM: Patient would like a follow up call , like to know if there is a plan of care in  reference to Kidney results, please advise [phone]

## 2017-10-02 NOTE — Telephone Encounter (Signed)
With patients current lab results he is supposed to cut back on his ibuprofen due to his kidney function levels. What would you like patient to do about his Duexis?

## 2017-10-02 NOTE — Telephone Encounter (Signed)
I would have him cut back to 1 time a day or discontinue if he thinks he can.  We may need to try other medicines in the long run.

## 2017-10-02 NOTE — Telephone Encounter (Signed)
Pt is calling back and requesting a call back.    

## 2017-10-02 NOTE — Telephone Encounter (Signed)
Copied from [place] [id number]. Topic: Quick Communication - See Telephone Encounter >> Oct 02, 2017 11:39 AM Burnis Medin, NT wrote: CRM for notification. See Telephone encounter for: Patient called and said he reviewed his results and had a few questions. Patient would like to see if the doctor or nurse could give him a call back.  10/02/17.

## 2017-10-03 ENCOUNTER — Other Ambulatory Visit: Payer: Self-pay | Admitting: Family Medicine

## 2017-10-03 NOTE — Telephone Encounter (Signed)
I called and left a VM

## 2017-10-03 NOTE — Telephone Encounter (Signed)
See other TE.

## 2017-10-03 NOTE — Telephone Encounter (Signed)
Pt.notified

## 2017-10-03 NOTE — Telephone Encounter (Signed)
Pt returned your call - he says that he believes that 2 of his current medications ( duexix and gabapentin) both contain ibuprofen. He is asking if he should stop taking those.  Please return call.

## 2017-10-05 ENCOUNTER — Encounter: Payer: Self-pay | Admitting: Family Medicine

## 2017-10-05 ENCOUNTER — Ambulatory Visit: Payer: 59 | Admitting: Family Medicine

## 2017-10-05 ENCOUNTER — Ambulatory Visit (INDEPENDENT_AMBULATORY_CARE_PROVIDER_SITE_OTHER)
Admission: RE | Admit: 2017-10-05 | Discharge: 2017-10-05 | Disposition: A | Discharge status: Home / Self Care | Payer: 59 | Source: Ambulatory Visit | Attending: Family Medicine | Admitting: Family Medicine

## 2017-10-05 VITALS — BP 140/80 | HR 86 | Ht 73.0 in | Wt 290.0 lb

## 2017-10-05 DIAGNOSIS — R29898 Other symptoms and signs involving the musculoskeletal system: Secondary | ICD-10-CM | POA: Diagnosis not present

## 2017-10-05 DIAGNOSIS — M545 Low back pain: Secondary | ICD-10-CM | POA: Diagnosis not present

## 2017-10-05 DIAGNOSIS — M549 Dorsalgia, unspecified: Secondary | ICD-10-CM

## 2017-10-05 DIAGNOSIS — S3992XA Unspecified injury of lower back, initial encounter: Secondary | ICD-10-CM | POA: Diagnosis not present

## 2017-10-05 MED ORDER — PREDNISONE 50 MG PO TABS: 50.0000 mg | ORAL_TABLET | Freq: Every day | ORAL | 0 refills | Status: DC

## 2017-10-05 MED ORDER — GABAPENTIN 300 MG PO CAPS: ORAL_CAPSULE | ORAL | 3 refills | Status: DC

## 2017-10-05 MED ORDER — GABAPENTIN 100 MG PO CAPS: 100.0000 mg | ORAL_CAPSULE | Freq: Two times a day (BID) | ORAL | 3 refills | Status: DC

## 2017-10-05 NOTE — Assessment & Plan Note (Signed)
Worsening at this time.  Discussed with patient in great length.  Feeling there is likely a lumbar radiculopathy.  We discussed which activities of doing which wants to avoid.  Patient given prednisone.  Will do injection ability of monitoring for clot burden fairly highly unlikely.  Lumbar radiculopathy with possible herniated disc is within the differential.  We will see how patient responds to conservative therapy.  Worsening symptoms seek medical attention immediately.

## 2017-10-05 NOTE — Patient Instructions (Signed)
Good to see you  Ice 20 minutes 2 times daily. Usually after activity and before bed. Prednisone daily for 7 days but wait til Tuesday  2 injections today  Enjoy the chair.  Gabapentin 100mg  Am, 100mg  in PM and 300mg  at night See me again in 2 week s
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
## 2017-10-14 ENCOUNTER — Other Ambulatory Visit: Payer: Self-pay | Admitting: Family Medicine

## 2017-10-15 NOTE — Telephone Encounter (Signed)
Refill done.  

## 2017-10-24 ENCOUNTER — Ambulatory Visit: Payer: 59 | Admitting: Family Medicine
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
## 2017-11-12 ENCOUNTER — Encounter: Payer: Self-pay | Admitting: Internal Medicine

## 2017-11-12 ENCOUNTER — Encounter: Payer: Self-pay | Admitting: Family Medicine

## 2017-11-12 ENCOUNTER — Ambulatory Visit: Payer: 59 | Admitting: Family Medicine

## 2017-11-12 DIAGNOSIS — R29898 Other symptoms and signs involving the musculoskeletal system: Secondary | ICD-10-CM

## 2017-11-12 DIAGNOSIS — M7061 Trochanteric bursitis, right hip: Secondary | ICD-10-CM

## 2017-11-12 NOTE — Assessment & Plan Note (Signed)
Will be chronic.  Continue with the strength and conditioning.  Encourage weight loss.  Continue medications.

## 2017-11-12 NOTE — Assessment & Plan Note (Signed)
Improved from previous exam.  Continue to monitor.  Follow-up with me as needed

## 2018-01-01 DIAGNOSIS — I129 Hypertensive chronic kidney disease with stage 1 through stage 4 chronic kidney disease, or unspecified chronic kidney disease: Secondary | ICD-10-CM | POA: Diagnosis not present

## 2018-01-01 DIAGNOSIS — E1122 Type 2 diabetes mellitus with diabetic chronic kidney disease: Secondary | ICD-10-CM | POA: Diagnosis not present

## 2018-01-01 DIAGNOSIS — N183 Chronic kidney disease, stage 3 (moderate): Secondary | ICD-10-CM | POA: Diagnosis not present

## 2018-01-03 ENCOUNTER — Other Ambulatory Visit: Payer: Self-pay | Admitting: Family Medicine

## 2018-01-04 ENCOUNTER — Emergency Department (HOSPITAL_COMMUNITY)
Admission: EM | Admit: 2018-01-04 | Discharge: 2018-01-04 | Disposition: A | Discharge status: Home / Self Care | Payer: 59 | Attending: Emergency Medicine | Admitting: Emergency Medicine

## 2018-01-04 ENCOUNTER — Encounter (HOSPITAL_COMMUNITY): Payer: Self-pay | Admitting: Emergency Medicine

## 2018-01-04 ENCOUNTER — Ambulatory Visit: Payer: 59 | Admitting: Family

## 2018-01-04 ENCOUNTER — Encounter: Payer: Self-pay | Admitting: Family

## 2018-01-04 DIAGNOSIS — R112 Nausea with vomiting, unspecified: Secondary | ICD-10-CM | POA: Diagnosis not present

## 2018-01-04 DIAGNOSIS — I10 Essential (primary) hypertension: Secondary | ICD-10-CM | POA: Insufficient documentation

## 2018-01-04 DIAGNOSIS — R197 Diarrhea, unspecified: Secondary | ICD-10-CM | POA: Insufficient documentation

## 2018-01-04 DIAGNOSIS — Z79899 Other long term (current) drug therapy: Secondary | ICD-10-CM | POA: Insufficient documentation

## 2018-01-04 DIAGNOSIS — R3121 Asymptomatic microscopic hematuria: Secondary | ICD-10-CM | POA: Diagnosis not present

## 2018-01-04 DIAGNOSIS — E119 Type 2 diabetes mellitus without complications: Secondary | ICD-10-CM | POA: Diagnosis not present

## 2018-01-04 DIAGNOSIS — Z7984 Long term (current) use of oral hypoglycemic drugs: Secondary | ICD-10-CM | POA: Insufficient documentation

## 2018-01-04 LAB — URINALYSIS, ROUTINE W REFLEX MICROSCOPIC
BILIRUBIN URINE: NEGATIVE
Bacteria, UA: NONE SEEN
KETONES UR: 20 mg/dL — AB
LEUKOCYTES UA: NEGATIVE
NITRITE: NEGATIVE
PH: 5 (ref 5.0–8.0)
Protein, ur: 30 mg/dL — AB
SPECIFIC GRAVITY, URINE: 1.032 — AB (ref 1.005–1.030)

## 2018-01-04 LAB — CBC
HCT: 45.9 % (ref 39.0–52.0)
Hemoglobin: 15.5 g/dL (ref 13.0–17.0)
MCH: 32.6 pg (ref 26.0–34.0)
MCHC: 33.8 g/dL (ref 30.0–36.0)
MCV: 96.4 fL (ref 78.0–100.0)
PLATELETS: 219 10*3/uL (ref 150–400)
RBC: 4.76 MIL/uL (ref 4.22–5.81)
RDW: 13.3 % (ref 11.5–15.5)
WBC: 6.4 10*3/uL (ref 4.0–10.5)

## 2018-01-04 LAB — COMPREHENSIVE METABOLIC PANEL
ALT: 21 U/L (ref 17–63)
AST: 28 U/L (ref 15–41)
Albumin: 4.7 g/dL (ref 3.5–5.0)
Alkaline Phosphatase: 34 U/L — ABNORMAL LOW (ref 38–126)
Anion gap: 14 (ref 5–15)
BUN: 21 mg/dL — ABNORMAL HIGH (ref 6–20)
CHLORIDE: 100 mmol/L — AB (ref 101–111)
CO2: 26 mmol/L (ref 22–32)
CREATININE: 1.8 mg/dL — AB (ref 0.61–1.24)
Calcium: 10 mg/dL (ref 8.9–10.3)
GFR calc non Af Amer: 42 mL/min — ABNORMAL LOW (ref >60)
GFR, EST AFRICAN AMERICAN: 49 mL/min — AB (ref >60)
Glucose, Bld: 179 mg/dL — ABNORMAL HIGH (ref 65–99)
Potassium: 3.6 mmol/L (ref 3.5–5.1)
SODIUM: 140 mmol/L (ref 135–145)
TOTAL PROTEIN: 8.7 g/dL — AB (ref 6.5–8.1)
Total Bilirubin: 1.5 mg/dL — ABNORMAL HIGH (ref 0.3–1.2)

## 2018-01-04 LAB — LIPASE, BLOOD: LIPASE: 33 U/L (ref 11–51)

## 2018-01-04 MED ORDER — ONDANSETRON HCL 4 MG/2ML IJ SOLN
4.0000 mg | Freq: Three times a day (TID) | INTRAMUSCULAR | Status: DC | PRN
  Administered 2018-01-04: 4 mg via INTRAMUSCULAR

## 2018-01-04 MED ORDER — ONDANSETRON HCL 4 MG PO TABS: 4.0000 mg | ORAL_TABLET | Freq: Three times a day (TID) | ORAL | 0 refills | Status: DC | PRN

## 2018-01-04 MED ORDER — ONDANSETRON HCL 4 MG/2ML IJ SOLN
4.0000 mg | Freq: Once | INTRAMUSCULAR | Status: AC
  Administered 2018-01-04: 4 mg via INTRAVENOUS
  Filled 2018-01-04: qty 2

## 2018-01-04 MED ORDER — GI COCKTAIL ~~LOC~~
30.0000 mL | Freq: Once | ORAL | Status: AC
  Administered 2018-01-04: 30 mL via ORAL
  Filled 2018-01-04: qty 30

## 2018-01-04 MED ORDER — SODIUM CHLORIDE 0.9 % IV BOLUS
1000.0000 mL | Freq: Once | INTRAVENOUS | Status: AC
  Administered 2018-01-04: 1000 mL via INTRAVENOUS

## 2018-01-04 NOTE — Discharge Instructions (Signed)
Your symptoms may be related to a viral stomach bug.  Take zofran as needed for nausea, drink plenty of fluid.  Your urine has trace of blood in it. Call and Follow up with [facility] for further evaluation.
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
## 2018-01-04 NOTE — ED Triage Notes (Signed)
Pt reports had n/v since Tuesday and diarrhea starting today. Went to PCP who shot of Zofran Im and sent to ED for further evaluation.

## 2018-01-04 NOTE — Telephone Encounter (Signed)
Refill done.  
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
## 2018-01-07 ENCOUNTER — Other Ambulatory Visit: Payer: Self-pay | Admitting: Nephrology

## 2018-01-07 DIAGNOSIS — N183 Chronic kidney disease, stage 3 unspecified: Secondary | ICD-10-CM

## 2018-01-09 ENCOUNTER — Ambulatory Visit: Payer: 59 | Admitting: Family Medicine

## 2018-01-11 ENCOUNTER — Ambulatory Visit
Admission: RE | Admit: 2018-01-11 | Discharge: 2018-01-11 | Disposition: A | Discharge status: Home / Self Care | Payer: 59 | Source: Ambulatory Visit | Attending: Nephrology | Admitting: Nephrology

## 2018-01-11 DIAGNOSIS — N183 Chronic kidney disease, stage 3 unspecified: Secondary | ICD-10-CM

## 2018-01-11 DIAGNOSIS — N189 Chronic kidney disease, unspecified: Secondary | ICD-10-CM | POA: Diagnosis not present

## 2018-01-26 ENCOUNTER — Telehealth: Payer: Self-pay | Admitting: Family Medicine

## 2018-01-28 NOTE — Telephone Encounter (Signed)
Refill denied. Pt has completed course of treatment with prednisone.

## 2018-01-30 ENCOUNTER — Telehealth: Payer: Self-pay | Admitting: Internal Medicine

## 2018-01-30 NOTE — Telephone Encounter (Signed)
Prescription for losartan 50 mg has expired (01/11/18) LOV  0510/19 Dr. Alain Marion  [facility]  [address], [facility]

## 2018-01-30 NOTE — Telephone Encounter (Signed)
Copied from [place] [id number]. Topic: Quick Communication - Rx Refill/Question >> Jan 30, 2018  8:30 AM Nils Flack wrote: Medication: losartan   Has the patient contacted their pharmacy? Yes.   (Agent: If no, request that the patient contact the pharmacy for the refill.) (Agent: If yes, when and what did the pharmacy advise?)  Preferred Pharmacy (with phone number or street name): [facility] [address]  Pt is out of meds, he says the pharm requested this days ago   Agent: Please be advised that RX refills may take up to 3 business days. We ask that you follow-up with your pharmacy.

## 2018-01-31 MED ORDER — LOSARTAN POTASSIUM 100 MG PO TABS: 100.0000 mg | ORAL_TABLET | Freq: Every day | ORAL | 3 refills | Status: DC

## 2018-01-31 NOTE — Telephone Encounter (Signed)
Pt is on 100mg  not 50, RX sent

## 2018-02-01 NOTE — Telephone Encounter (Signed)
Patient states that he is requesting this medication just in case. States that [place] and is needing this in case his leg starts to act up. States that he is leaving on 02/08/18. Please advise. CB#: [id number] - [place],  - [address] AT [address]

## 2018-02-04 MED ORDER — PREDNISONE 50 MG PO TABS: 50.0000 mg | ORAL_TABLET | Freq: Every day | ORAL | 0 refills | Status: DC

## 2018-02-04 NOTE — Addendum Note (Signed)
Addended by: Lyndal Pulley on: 02/04/2018 08:28 PM   Modules accepted: Orders

## 2018-02-08 ENCOUNTER — Other Ambulatory Visit: Payer: Self-pay | Admitting: Family Medicine

## 2018-02-11 NOTE — Telephone Encounter (Signed)
Refill done.  

## 2018-03-22 ENCOUNTER — Other Ambulatory Visit: Payer: Self-pay | Admitting: Family Medicine

## 2018-03-22 NOTE — Telephone Encounter (Signed)
Refill done.  

## 2018-03-29 ENCOUNTER — Encounter: Payer: Self-pay | Admitting: Internal Medicine

## 2018-03-29 ENCOUNTER — Ambulatory Visit: Payer: 59 | Admitting: Internal Medicine

## 2018-03-29 DIAGNOSIS — Z91018 Allergy to other foods: Secondary | ICD-10-CM

## 2018-03-29 DIAGNOSIS — E1165 Type 2 diabetes mellitus with hyperglycemia: Secondary | ICD-10-CM | POA: Diagnosis not present

## 2018-03-29 DIAGNOSIS — K432 Incisional hernia without obstruction or gangrene: Secondary | ICD-10-CM | POA: Diagnosis not present

## 2018-03-29 DIAGNOSIS — G629 Polyneuropathy, unspecified: Secondary | ICD-10-CM | POA: Diagnosis not present

## 2018-03-29 MED ORDER — PREDNISONE 50 MG PO TABS: 50.0000 mg | ORAL_TABLET | Freq: Every day | ORAL | 0 refills | Status: DC

## 2018-03-29 NOTE — Assessment & Plan Note (Addendum)
Doing better Predn prn

## 2018-03-29 NOTE — Assessment & Plan Note (Addendum)
A blue stitch showing off to the L of the belly button Surg ref Dr Hulen Skains

## 2018-03-29 NOTE — Assessment & Plan Note (Signed)
Chronic  Risks associated with treatment noncompliance were discussed. Compliance was encouraged. Invokamet

## 2018-03-29 NOTE — Assessment & Plan Note (Signed)
Labs Gabapentin

## 2018-03-29 NOTE — Progress Notes (Signed)
Subjective:  Patient ID: [name], male    DOB: 10-01-1965  Age: 52 y.o. MRN: [id number]  CC: No chief complaint on file.   HPI [name] presents for DM, neuropathy, HTN f/u C/o numbness in B feet Numbness felt better in 2-3 d when he ran out of all meds (?!)   Outpatient Medications Prior to Visit  Medication Sig Dispense Refill  . AMBULATORY NON FORMULARY MEDICATION AFO-Right Lower Extremity 1 Device 0  . aspirin-acetaminophen-caffeine (EXCEDRIN MIGRAINE) 177-939-03 MG tablet Take 2 tablets by mouth every 6 (six) hours as needed for headache.    . cholecalciferol (VITAMIN D) 1000 UNITS tablet Take 1 tablet (1,000 Units total) by mouth daily. 100 tablet 3  . Dapagliflozin-Metformin HCl ER (XIGDUO XR) 12-998 MG TB24 Take 1 tablet by mouth 2 (two) times daily. 60 tablet 11  . Empagliflozin-Metformin HCl (SYNJARDY) 12-998 MG TABS Take 1 tablet by mouth 2 (two) times daily. 180 tablet 3  . gabapentin (NEURONTIN) 100 MG capsule Take 1 capsule (100 mg total) by mouth 2 (two) times daily. (Patient taking differently: Take 100 mg by mouth daily. ) 180 capsule 3  . gabapentin (NEURONTIN) 300 MG capsule TAKE ONE CAPSULE BY MOUTH EVERY NIGHT 90 capsule 1  . Ibuprofen-Famotidine (DUEXIS) 800-26.6 MG TABS Take 1 tablet by mouth 2 (two) times daily. 90 tablet 3  . levocetirizine (XYZAL) 5 MG tablet Take 1 tablet (5 mg total) by mouth every evening. 100 tablet 3  . losartan (COZAAR) 100 MG tablet Take 1 tablet (100 mg total) by mouth daily. 90 tablet 3  . naproxen sodium (ALEVE) 220 MG tablet Take 440 mg by mouth 2 (two) times daily as needed (headache).    . ondansetron (ZOFRAN) 4 MG tablet Take 1 tablet (4 mg total) by mouth every 8 (eight) hours as needed for nausea or vomiting. 30 tablet 0  . predniSONE (DELTASONE) 50 MG tablet Take 1 tablet (50 mg total) by mouth daily. 7 tablet 0  . venlafaxine XR (EFFEXOR-XR) 150 MG 24 hr capsule TAKE 1 CAPSULE BY MOUTH DAILY WITH BREAKFAST 90  capsule 1  . vitamin B-12 (CYANOCOBALAMIN) 1000 MCG tablet Take 1 tablet (1,000 mcg total) by mouth daily. 100 tablet 3  . Vitamin D, Ergocalciferol, (DRISDOL) 50000 units CAPS capsule TAKE 1 CAPSULE BY MOUTH EVERY 7 DAYS 12 capsule 0   No facility-administered medications prior to visit.     ROS: Review of Systems  Constitutional: Negative for appetite change, fatigue and unexpected weight change.  HENT: Negative for congestion, nosebleeds, sneezing, sore throat and trouble swallowing.   Eyes: Negative for itching and visual disturbance.  Respiratory: Negative for cough.   Cardiovascular: Negative for chest pain, palpitations and leg swelling.  Gastrointestinal: Negative for abdominal distention, blood in stool, diarrhea and nausea.  Genitourinary: Negative for frequency and hematuria.  Musculoskeletal: Positive for gait problem. Negative for back pain, joint swelling and neck pain.  Skin: Negative for rash.  Neurological: Negative for dizziness, tremors, speech difficulty and weakness.  Psychiatric/Behavioral: Negative for agitation, dysphoric mood, sleep disturbance and suicidal ideas. The patient is not nervous/anxious.     Objective:  BP 124/82 (BP Location: Left Arm, Patient Position: Sitting, Cuff Size: Large)   Pulse 85   Temp 98.1 F (36.7 C) (Oral)   Ht 6\' 1"  (1.854 m)   Wt 298 lb (135.2 kg)   SpO2 99%   BMI 39.32 kg/m   BP Readings from Last 3 Encounters:  03/29/18 124/82  01/04/18 (!) 147/97  11/12/17 120/80    Wt Readings from Last 3 Encounters:  03/29/18 298 lb (135.2 kg)  11/12/17 298 lb (135.2 kg)  10/05/17 290 lb (131.5 kg)    Physical Exam  Constitutional: He is oriented to person, place, and time. He appears well-developed. No distress.  NAD  HENT:  Mouth/Throat: Oropharynx is clear and moist.  Eyes: Pupils are equal, round, and reactive to light. Conjunctivae are normal.  Neck: Normal range of motion. No JVD present. No thyromegaly present.    Cardiovascular: Normal rate, regular rhythm, normal heart sounds and intact distal pulses. Exam reveals no gallop and no friction rub.  No murmur heard. Pulmonary/Chest: Effort normal and breath sounds normal. No respiratory distress. He has no wheezes. He has no rales. He exhibits no tenderness.  Abdominal: Soft. Bowel sounds are normal. He exhibits no distension and no mass. There is no tenderness. There is no rebound and no guarding.  Musculoskeletal: Normal range of motion. He exhibits no edema or tenderness.  Lymphadenopathy:    He has no cervical adenopathy.  Neurological: He is alert and oriented to person, place, and time. He has normal reflexes. No cranial nerve deficit. He exhibits normal muscle tone. He displays a negative Romberg sign. Coordination and gait normal.  Skin: Skin is warm and dry. No rash noted.  Psychiatric: He has a normal mood and affect. His behavior is normal. Judgment and thought content normal.   a blue stitch showing off to the L of the belly button  Lab Results  Component Value Date   WBC 6.4 01/04/2018   HGB 15.5 01/04/2018   HCT 45.9 01/04/2018   PLT 219 01/04/2018   GLUCOSE 179 (H) 01/04/2018   CHOL 193 11/27/2013   TRIG 83.0 11/27/2013   HDL 64.40 11/27/2013   LDLCALC 112 (H) 11/27/2013   ALT 21 01/04/2018   AST 28 01/04/2018   NA 140 01/04/2018   K 3.6 01/04/2018   CL 100 (L) 01/04/2018   CREATININE 1.80 (H) 01/04/2018   BUN 21 (H) 01/04/2018   CO2 26 01/04/2018   TSH 1.21 10/01/2017   PSA 0.97 10/01/2017   HGBA1C 6.5 10/01/2017   MICROALBUR 4.9 (H) 10/01/2017    US Renal  Result Date: 01/11/2018 CLINICAL DATA:  Chronic renal disease. EXAM: RENAL / URINARY TRACT ULTRASOUND COMPLETE COMPARISON:  None. FINDINGS: Right Kidney: Length: 11.4 cm. Echogenicity within normal limits. No mass or hydronephrosis visualized. Left Kidney: Length: 12.3 cm. Echogenicity within normal limits. No mass or hydronephrosis visualized. Bladder: Appears normal  for degree of bladder distention. IMPRESSION: No significant abnormalities identified. Electronically Signed   By: Dorise Bullion III M.D   On: 01/11/2018 12:08    Assessment & Plan:   There are no diagnoses linked to this encounter.   No orders of the defined types were placed in this encounter.    Follow-up: No follow-ups on file.  Walker Kehr, MD

## 2018-04-04 ENCOUNTER — Other Ambulatory Visit (INDEPENDENT_AMBULATORY_CARE_PROVIDER_SITE_OTHER): Payer: 59

## 2018-04-04 DIAGNOSIS — G629 Polyneuropathy, unspecified: Secondary | ICD-10-CM

## 2018-04-04 LAB — LIPID PANEL
CHOLESTEROL: 173 mg/dL (ref 0–200)
HDL: 63 mg/dL (ref >39.00)
LDL Cholesterol: 95 mg/dL (ref 0–99)
NonHDL: 109.89
Total CHOL/HDL Ratio: 3
Triglycerides: 76 mg/dL (ref 0.0–149.0)
VLDL: 15.2 mg/dL (ref 0.0–40.0)

## 2018-04-04 LAB — BASIC METABOLIC PANEL
BUN: 25 mg/dL — AB (ref 6–23)
CALCIUM: 10.6 mg/dL — AB (ref 8.4–10.5)
CO2: 25 mEq/L (ref 19–32)
CREATININE: 1.78 mg/dL — AB (ref 0.40–1.50)
Chloride: 103 mEq/L (ref 96–112)
GFR: 52.18 mL/min — AB (ref >60.00)
Glucose, Bld: 136 mg/dL — ABNORMAL HIGH (ref 70–99)
Potassium: 4.3 mEq/L (ref 3.5–5.1)
Sodium: 139 mEq/L (ref 135–145)

## 2018-04-04 LAB — CBC WITH DIFFERENTIAL/PLATELET
BASOS ABS: 0 10*3/uL (ref 0.0–0.1)
BASOS PCT: 0.5 % (ref 0.0–3.0)
EOS PCT: 0.3 % (ref 0.0–5.0)
Eosinophils Absolute: 0 10*3/uL (ref 0.0–0.7)
HCT: 44.4 % (ref 39.0–52.0)
Hemoglobin: 14.9 g/dL (ref 13.0–17.0)
LYMPHS ABS: 2.3 10*3/uL (ref 0.7–4.0)
Lymphocytes Relative: 37.8 % (ref 12.0–46.0)
MCHC: 33.6 g/dL (ref 30.0–36.0)
MCV: 93 fl (ref 78.0–100.0)
MONOS PCT: 7 % (ref 3.0–12.0)
Monocytes Absolute: 0.4 10*3/uL (ref 0.1–1.0)
NEUTROS ABS: 3.3 10*3/uL (ref 1.4–7.7)
NEUTROS PCT: 54.4 % (ref 43.0–77.0)
PLATELETS: 208 10*3/uL (ref 150.0–400.0)
RBC: 4.78 Mil/uL (ref 4.22–5.81)
RDW: 13.4 % (ref 11.5–15.5)
WBC: 6.1 10*3/uL (ref 4.0–10.5)

## 2018-04-04 LAB — HEPATIC FUNCTION PANEL
ALK PHOS: 44 U/L (ref 39–117)
ALT: 16 U/L (ref 0–53)
AST: 16 U/L (ref 0–37)
Albumin: 4.7 g/dL (ref 3.5–5.2)
BILIRUBIN DIRECT: 0.2 mg/dL (ref 0.0–0.3)
BILIRUBIN TOTAL: 1.1 mg/dL (ref 0.2–1.2)
TOTAL PROTEIN: 7.7 g/dL (ref 6.0–8.3)

## 2018-04-04 LAB — VITAMIN D 25 HYDROXY (VIT D DEFICIENCY, FRACTURES): VITD: 83.46 ng/mL (ref 30.00–100.00)

## 2018-04-04 LAB — HEMOGLOBIN A1C: HEMOGLOBIN A1C: 6.9 % — AB (ref 4.6–6.5)

## 2018-04-04 LAB — VITAMIN B12: Vitamin B-12: 1244 pg/mL — ABNORMAL HIGH (ref 211–911)

## 2018-04-05 LAB — IRON,TIBC AND FERRITIN PANEL
%SAT: 18 % — AB (ref 20–48)
FERRITIN: 171 ng/mL (ref 38–380)
Iron: 58 ug/dL (ref 50–180)
TIBC: 315 mcg/dL (calc) (ref 250–425)

## 2018-04-10 ENCOUNTER — Telehealth: Payer: Self-pay

## 2018-04-10 NOTE — Telephone Encounter (Signed)
Please advise  Copied from [place] [id number]. Topic: Referral - Question >> Apr 10, 2018 12:53 PM Wynetta Emery, [place] C wrote: Reason for CRM: pt says that in ov he and provider discuss acupuncture, pt says that he is suppose to have a referral placed. Pt is checking the status.   Please advise.

## 2018-04-11 NOTE — Telephone Encounter (Signed)
[name] needs to call [facility] [phone] to ask for an appt with Kara Dies who does acupuncture. Thx

## 2018-04-11 NOTE — Telephone Encounter (Signed)
LMTCB

## 2018-04-11 NOTE — Telephone Encounter (Signed)
Pt.notified

## 2018-04-23 DIAGNOSIS — T8189XA Other complications of procedures, not elsewhere classified, initial encounter: Secondary | ICD-10-CM | POA: Diagnosis not present

## 2018-04-23 DIAGNOSIS — Z189 Retained foreign body fragments, unspecified material: Secondary | ICD-10-CM | POA: Diagnosis not present

## 2018-06-12 ENCOUNTER — Other Ambulatory Visit: Payer: Self-pay | Admitting: Family Medicine

## 2018-08-09 ENCOUNTER — Other Ambulatory Visit: Payer: Self-pay | Admitting: Internal Medicine

## 2018-08-09 NOTE — Telephone Encounter (Signed)
Please advise about refill

## 2018-08-09 NOTE — Telephone Encounter (Signed)
Copied from [place] [id number]. Topic: Quick Communication - Rx Refill/Question >> Aug 09, 2018 12:31 PM Leward Quan A wrote: Medication: gabapentin (NEURONTIN) 300 MG capsule  Patient is completely out of this medication  Has the patient contacted their pharmacy? Yes.   (Agent: If no, request that the patient contact the pharmacy for the refill.) (Agent: If yes, when and what did the pharmacy advise?)  Preferred Pharmacy (with phone number or street name): [id number] - [place], [place] - [address] AT [place] [phone] (Phone) [phone] (Fax)    Agent: Please be advised that RX refills may take up to 3 business days. We ask that you follow-up with your pharmacy.

## 2018-08-09 NOTE — Telephone Encounter (Signed)
Called patient to see how he is taking the gabapentin 300 mg. He stated that he is taking it twice a day. So he is asking for prescription to read twice a day.  He also has a prescription of gabapentin that reads 100 mg bid which he states that he is not taking.  He is out of this medication.

## 2018-08-13 MED ORDER — GABAPENTIN 300 MG PO CAPS: 300.0000 mg | ORAL_CAPSULE | Freq: Two times a day (BID) | ORAL | 1 refills | Status: DC

## 2018-08-26 ENCOUNTER — Encounter: Payer: 59 | Admitting: Internal Medicine

## 2018-09-12 ENCOUNTER — Other Ambulatory Visit: Payer: Self-pay | Admitting: Internal Medicine

## 2018-09-18 ENCOUNTER — Ambulatory Visit (INDEPENDENT_AMBULATORY_CARE_PROVIDER_SITE_OTHER): Payer: 59 | Admitting: Internal Medicine

## 2018-09-18 ENCOUNTER — Encounter: Payer: Self-pay | Admitting: Internal Medicine

## 2018-09-18 ENCOUNTER — Other Ambulatory Visit (INDEPENDENT_AMBULATORY_CARE_PROVIDER_SITE_OTHER): Payer: 59

## 2018-09-18 DIAGNOSIS — I10 Essential (primary) hypertension: Secondary | ICD-10-CM | POA: Diagnosis not present

## 2018-09-18 DIAGNOSIS — E1165 Type 2 diabetes mellitus with hyperglycemia: Secondary | ICD-10-CM

## 2018-09-18 DIAGNOSIS — R29898 Other symptoms and signs involving the musculoskeletal system: Secondary | ICD-10-CM

## 2018-09-18 DIAGNOSIS — Z91018 Allergy to other foods: Secondary | ICD-10-CM

## 2018-09-18 DIAGNOSIS — Z6839 Body mass index (BMI) 39.0-39.9, adult: Secondary | ICD-10-CM

## 2018-09-18 LAB — BASIC METABOLIC PANEL
BUN: 26 mg/dL — ABNORMAL HIGH (ref 6–23)
CO2: 28 mEq/L (ref 19–32)
Calcium: 10.5 mg/dL (ref 8.4–10.5)
Chloride: 103 mEq/L (ref 96–112)
Creatinine, Ser: 1.72 mg/dL — ABNORMAL HIGH (ref 0.40–1.50)
GFR: 50.98 mL/min — ABNORMAL LOW (ref >60.00)
Glucose, Bld: 84 mg/dL (ref 70–99)
Potassium: 4.6 mEq/L (ref 3.5–5.1)
SODIUM: 138 meq/L (ref 135–145)

## 2018-09-18 LAB — LIPID PANEL
CHOLESTEROL: 175 mg/dL (ref 0–200)
HDL: 70.3 mg/dL (ref >39.00)
LDL Cholesterol: 94 mg/dL (ref 0–99)
NonHDL: 104.89
TRIGLYCERIDES: 55 mg/dL (ref 0.0–149.0)
Total CHOL/HDL Ratio: 2
VLDL: 11 mg/dL (ref 0.0–40.0)

## 2018-09-18 LAB — CBC WITH DIFFERENTIAL/PLATELET
BASOS PCT: 0.7 % (ref 0.0–3.0)
Basophils Absolute: 0 10*3/uL (ref 0.0–0.1)
EOS PCT: 0.8 % (ref 0.0–5.0)
Eosinophils Absolute: 0 10*3/uL (ref 0.0–0.7)
HCT: 44.1 % (ref 39.0–52.0)
Hemoglobin: 14.9 g/dL (ref 13.0–17.0)
Lymphocytes Relative: 54.7 % — ABNORMAL HIGH (ref 12.0–46.0)
Lymphs Abs: 3.1 10*3/uL (ref 0.7–4.0)
MCHC: 33.8 g/dL (ref 30.0–36.0)
MCV: 93 fl (ref 78.0–100.0)
Monocytes Absolute: 0.4 10*3/uL (ref 0.1–1.0)
Monocytes Relative: 7.1 % (ref 3.0–12.0)
NEUTROS ABS: 2.1 10*3/uL (ref 1.4–7.7)
Neutrophils Relative %: 36.7 % — ABNORMAL LOW (ref 43.0–77.0)
PLATELETS: 201 10*3/uL (ref 150.0–400.0)
RBC: 4.74 Mil/uL (ref 4.22–5.81)
RDW: 13.7 % (ref 11.5–15.5)
WBC: 5.7 10*3/uL (ref 4.0–10.5)

## 2018-09-18 LAB — URINALYSIS, ROUTINE W REFLEX MICROSCOPIC
Bilirubin Urine: NEGATIVE
Ketones, ur: NEGATIVE
Leukocytes, UA: NEGATIVE
Nitrite: NEGATIVE
Specific Gravity, Urine: 1.02 (ref 1.000–1.030)
Total Protein, Urine: NEGATIVE
Urine Glucose: >=1000 — AB
Urobilinogen, UA: 0.2 (ref 0.0–1.0)
pH: 5 (ref 5.0–8.0)

## 2018-09-18 LAB — TSH: TSH: 0.86 u[IU]/mL (ref 0.35–4.50)

## 2018-09-18 LAB — HEPATIC FUNCTION PANEL
ALT: 17 U/L (ref 0–53)
AST: 21 U/L (ref 0–37)
Albumin: 4.8 g/dL (ref 3.5–5.2)
Alkaline Phosphatase: 43 U/L (ref 39–117)
Bilirubin, Direct: 0.2 mg/dL (ref 0.0–0.3)
Total Bilirubin: 0.9 mg/dL (ref 0.2–1.2)
Total Protein: 8.1 g/dL (ref 6.0–8.3)

## 2018-09-18 LAB — HEMOGLOBIN A1C: Hgb A1c MFr Bld: 6.2 % (ref 4.6–6.5)

## 2018-09-18 MED ORDER — METFORMIN HCL 1000 MG PO TABS: 1000.0000 mg | ORAL_TABLET | Freq: Two times a day (BID) | ORAL | 3 refills | Status: DC

## 2018-09-18 NOTE — Patient Instructions (Signed)

## 2018-09-18 NOTE — Assessment & Plan Note (Addendum)
[name]  thinks that his leg weakness is related to his [place] use.  He ran out of it for a few days and his muscle strength was 50% better according to his observation.  She has leg became weaker again when he went back on [place].  We will separate [name] and metformin and have the patient take them separately monitoring his leg weakness symptoms.  See orders.

## 2018-09-18 NOTE — Progress Notes (Signed)
Subjective:  Patient ID: OWENN ROTHERMEL, male    DOB: 1966/02/19  Age: 53 y.o. MRN: [id number]  CC: No chief complaint on file.   HPI Bretton Tandy Sweda presents for DM, HTN, rash f/u [place]  thinks that his leg weakness is related to his [place] use.  He ran out of it for a few days and his muscle strength was 50% better according to his observation.  She has leg became weaker again when he went back on [place].  Outpatient Medications Prior to Visit  Medication Sig Dispense Refill  . AMBULATORY NON FORMULARY MEDICATION AFO-Right Lower Extremity 1 Device 0  . aspirin-acetaminophen-caffeine (EXCEDRIN MIGRAINE) 401-027-25 MG tablet Take 2 tablets by mouth every 6 (six) hours as needed for headache.    . cholecalciferol (VITAMIN D) 1000 UNITS tablet Take 1 tablet (1,000 Units total) by mouth daily. 100 tablet 3  . gabapentin (NEURONTIN) 300 MG capsule Take 1 capsule (300 mg total) by mouth 2 (two) times daily. TAKE ONE CAPSULE BY MOUTH EVERY NIGHT 180 capsule 1  . Ibuprofen-Famotidine (DUEXIS) 800-26.6 MG TABS Take 1 tablet by mouth 2 (two) times daily. 90 tablet 3  . levocetirizine (XYZAL) 5 MG tablet Take 1 tablet (5 mg total) by mouth every evening. 100 tablet 3  . losartan (COZAAR) 100 MG tablet Take 1 tablet (100 mg total) by mouth daily. 90 tablet 3  . naproxen sodium (ALEVE) 220 MG tablet Take 440 mg by mouth 2 (two) times daily as needed (headache).    . ondansetron (ZOFRAN) 4 MG tablet Take 1 tablet (4 mg total) by mouth every 8 (eight) hours as needed for nausea or vomiting. 30 tablet 0  . predniSONE (DELTASONE) 50 MG tablet Take 1 tablet (50 mg total) by mouth daily. 7 tablet 0  . SYNJARDY 12-998 MG TABS TAKE 1 TABLET BY MOUTH TWICE DAILY 180 tablet 3  . venlafaxine XR (EFFEXOR-XR) 150 MG 24 hr capsule TAKE 1 CAPSULE BY MOUTH DAILY WITH BREAKFAST 90 capsule 1  . vitamin B-12 (CYANOCOBALAMIN) 1000 MCG tablet Take 1 tablet (1,000 mcg total) by mouth daily. 100 tablet 3  . Vitamin D,  Ergocalciferol, (DRISDOL) 50000 units CAPS capsule TAKE 1 CAPSULE BY MOUTH EVERY 7 DAYS 12 capsule 0   No facility-administered medications prior to visit.     ROS: Review of Systems  Constitutional: Negative for appetite change, fatigue and unexpected weight change.  HENT: Negative for congestion, nosebleeds, sneezing, sore throat and trouble swallowing.   Eyes: Negative for itching and visual disturbance.  Respiratory: Negative for cough.   Cardiovascular: Negative for chest pain, palpitations and leg swelling.  Gastrointestinal: Negative for abdominal distention, blood in stool, diarrhea and nausea.  Genitourinary: Negative for frequency and hematuria.  Musculoskeletal: Positive for gait problem. Negative for back pain, joint swelling and neck pain.  Skin: Negative for rash.  Neurological: Negative for dizziness, tremors, speech difficulty and weakness.  Psychiatric/Behavioral: Negative for agitation, dysphoric mood, sleep disturbance and suicidal ideas. The patient is not nervous/anxious.     Objective:  BP 132/76 (BP Location: Left Arm, Patient Position: Sitting, Cuff Size: Large)   Pulse 80   Temp 98.3 F (36.8 C) (Oral)   Ht 6\' 1"  (1.854 m)   Wt 291 lb (132 kg)   SpO2 99%   BMI 38.39 kg/m   BP Readings from Last 3 Encounters:  09/18/18 132/76  03/29/18 124/82  01/04/18 (!) 147/97    Wt Readings from Last 3 Encounters:  09/18/18 291  lb (132 kg)  03/29/18 298 lb (135.2 kg)  11/12/17 298 lb (135.2 kg)    Physical Exam Constitutional:      General: He is not in acute distress.    Appearance: He is well-developed.     Comments: NAD  Eyes:     Conjunctiva/sclera: Conjunctivae normal.     Pupils: Pupils are equal, round, and reactive to light.  Neck:     Musculoskeletal: Normal range of motion.     Thyroid: No thyromegaly.     Vascular: No JVD.  Cardiovascular:     Rate and Rhythm: Normal rate and regular rhythm.     Heart sounds: Normal heart sounds. No  murmur. No friction rub. No gallop.   Pulmonary:     Effort: Pulmonary effort is normal. No respiratory distress.     Breath sounds: Normal breath sounds. No wheezing or rales.  Chest:     Chest wall: No tenderness.  Abdominal:     General: Bowel sounds are normal. There is no distension.     Palpations: Abdomen is soft. There is no mass.     Tenderness: There is no abdominal tenderness. There is no guarding or rebound.  Musculoskeletal: Normal range of motion.        General: No tenderness.  Lymphadenopathy:     Cervical: No cervical adenopathy.  Skin:    General: Skin is warm and dry.     Findings: No rash.  Neurological:     Mental Status: He is alert and oriented to person, place, and time.     Cranial Nerves: No cranial nerve deficit.     Motor: No abnormal muscle tone.     Coordination: Coordination normal.     Gait: Gait normal.     Deep Tendon Reflexes: Reflexes are normal and symmetric.  Psychiatric:        Behavior: Behavior normal.        Thought Content: Thought content normal.        Judgment: Judgment normal.     Lab Results  Component Value Date   WBC 6.1 04/04/2018   HGB 14.9 04/04/2018   HCT 44.4 04/04/2018   PLT 208.0 04/04/2018   GLUCOSE 136 (H) 04/04/2018   CHOL 173 04/04/2018   TRIG 76.0 04/04/2018   HDL 63.00 04/04/2018   LDLCALC 95 04/04/2018   ALT 16 04/04/2018   AST 16 04/04/2018   NA 139 04/04/2018   K 4.3 04/04/2018   CL 103 04/04/2018   CREATININE 1.78 (H) 04/04/2018   BUN 25 (H) 04/04/2018   CO2 25 04/04/2018   TSH 1.21 10/01/2017   PSA 0.97 10/01/2017   HGBA1C 6.9 (H) 04/04/2018   MICROALBUR 4.9 (H) 10/01/2017    US Renal  Result Date: 01/11/2018 CLINICAL DATA:  Chronic renal disease. EXAM: RENAL / URINARY TRACT ULTRASOUND COMPLETE COMPARISON:  None. FINDINGS: Right Kidney: Length: 11.4 cm. Echogenicity within normal limits. No mass or hydronephrosis visualized. Left Kidney: Length: 12.3 cm. Echogenicity within normal limits. No  mass or hydronephrosis visualized. Bladder: Appears normal for degree of bladder distention. IMPRESSION: No significant abnormalities identified. Electronically Signed   By: Dorise Bullion III M.D   On: 01/11/2018 12:08    Assessment & Plan:   There are no diagnoses linked to this encounter.   No orders of the defined types were placed in this encounter.    Follow-up: No follow-ups on file.  Walker Kehr, MD

## 2018-09-18 NOTE — Assessment & Plan Note (Signed)
Wt Readings from Last 3 Encounters:  09/18/18 291 lb (132 kg)  03/29/18 298 lb (135.2 kg)  11/12/17 298 lb (135.2 kg)

## 2018-09-18 NOTE — Assessment & Plan Note (Signed)
Losartan 

## 2018-09-18 NOTE — Assessment & Plan Note (Addendum)
[name]  thinks that his leg weakness is related to his [place] use.  He ran out of it for a few days and his muscle strength was 50% better according to his observation.  She has leg became weaker again when he went back on [place].  We will separate [name] and metformin and have the patient take them separately monitoring his leg weakness symptoms.  See orders.

## 2018-09-19 ENCOUNTER — Telehealth: Payer: Self-pay | Admitting: Internal Medicine

## 2018-09-19 NOTE — Telephone Encounter (Signed)
Copied from [place] [id number]. Topic: Quick Communication - See Telephone Encounter >> Sep 19, 2018  2:43 PM Conception Chancy, NT wrote: CRM for notification. See Telephone encounter for: 09/19/18.  Patient is calling and states that predniSONE (DELTASONE) 50 MG tablet was supposed to be sent at visit on 09/18/18. Please advise.  [id number] - [place], [place] - [address] [place] [address] Phone: [phone] Fax: [phone]

## 2018-09-20 MED ORDER — PREDNISONE 50 MG PO TABS: 50.0000 mg | ORAL_TABLET | Freq: Every day | ORAL | 0 refills | Status: DC

## 2018-09-20 NOTE — Telephone Encounter (Signed)
Sorry, isolated.  Done.  Thank you

## 2018-09-22 ENCOUNTER — Encounter: Payer: Self-pay | Admitting: Internal Medicine

## 2018-09-22 NOTE — Assessment & Plan Note (Signed)
Rash has resolved.  No relapse

## 2018-09-26 DIAGNOSIS — N183 Chronic kidney disease, stage 3 (moderate): Secondary | ICD-10-CM | POA: Diagnosis not present

## 2018-09-26 DIAGNOSIS — E1129 Type 2 diabetes mellitus with other diabetic kidney complication: Secondary | ICD-10-CM | POA: Diagnosis not present

## 2018-09-26 DIAGNOSIS — I129 Hypertensive chronic kidney disease with stage 1 through stage 4 chronic kidney disease, or unspecified chronic kidney disease: Secondary | ICD-10-CM | POA: Diagnosis not present

## 2018-10-09 ENCOUNTER — Telehealth: Payer: Self-pay | Admitting: Internal Medicine

## 2018-10-10 NOTE — Telephone Encounter (Signed)
Patient called and asked why does he need Cephalexin. He says that he takes it when he has bumps on the back of his head and that is what was given by Dr. Alain Marion to take for it and the bumps will go away. He says he would like a refill because he's going out of town and would like the bumps gone. I advised I will send to Dr. Alain Marion.

## 2018-10-10 NOTE — Telephone Encounter (Addendum)
Pt called to follow up on refill for Cephalexin 500 MG  Would like to know why it was denied. Please advise [phone]

## 2018-10-11 NOTE — Telephone Encounter (Signed)
Please advise 

## 2018-10-14 MED ORDER — CEPHALEXIN 500 MG PO CAPS: 1000.0000 mg | ORAL_CAPSULE | Freq: Two times a day (BID) | ORAL | 0 refills | Status: DC

## 2018-10-14 NOTE — Addendum Note (Signed)
Addended by: Cassandria Anger on: 10/14/2018 10:31 PM   Modules accepted: Orders

## 2018-10-14 NOTE — Telephone Encounter (Signed)
Done. Thank you.

## 2018-11-18 ENCOUNTER — Telehealth: Payer: Self-pay | Admitting: Internal Medicine

## 2018-11-18 MED ORDER — VITAMIN B-12 1000 MCG PO TABS: 1000.0000 ug | ORAL_TABLET | Freq: Every day | ORAL | 3 refills | Status: DC

## 2018-11-18 NOTE — Telephone Encounter (Signed)
RX sent

## 2018-11-18 NOTE — Telephone Encounter (Signed)
Copied from [place] [id number]. Topic: Quick Communication - Rx Refill/Question >> Nov 18, 2018 12:31 PM Blase Mess A wrote: Medication: vitamin B-12 (CYANOCOBALAMIN) 1000 MCG tablet [[id number]]   Has the patient contacted their pharmacy? Yes  (Agent: If no, request that the patient contact the pharmacy for the refill.) (Agent: If yes, when and what did the pharmacy advise?)  Preferred Pharmacy (with phone number or street name): [id number] - [place], [place] - [address] AT [address] [phone] (Phone) [phone] (Fax)    Agent: Please be advised that RX refills may take up to 3 business days. We ask that you follow-up with your pharmacy.

## 2018-11-21 ENCOUNTER — Other Ambulatory Visit: Payer: Self-pay

## 2018-11-21 MED ORDER — VITAMIN D (ERGOCALCIFEROL) 1.25 MG (50000 UNIT) PO CAPS: ORAL_CAPSULE | ORAL | 0 refills | Status: DC

## 2018-12-13 ENCOUNTER — Other Ambulatory Visit: Payer: Self-pay | Admitting: Internal Medicine

## 2019-02-11 ENCOUNTER — Other Ambulatory Visit: Payer: Self-pay

## 2019-02-11 ENCOUNTER — Ambulatory Visit (INDEPENDENT_AMBULATORY_CARE_PROVIDER_SITE_OTHER): Payer: 59 | Admitting: Internal Medicine

## 2019-02-11 ENCOUNTER — Encounter: Payer: Self-pay | Admitting: Internal Medicine

## 2019-02-11 DIAGNOSIS — M1731 Unilateral post-traumatic osteoarthritis, right knee: Secondary | ICD-10-CM

## 2019-02-11 DIAGNOSIS — I10 Essential (primary) hypertension: Secondary | ICD-10-CM

## 2019-02-11 DIAGNOSIS — E1165 Type 2 diabetes mellitus with hyperglycemia: Secondary | ICD-10-CM

## 2019-02-11 DIAGNOSIS — R29898 Other symptoms and signs involving the musculoskeletal system: Secondary | ICD-10-CM | POA: Diagnosis not present

## 2019-02-11 MED ORDER — METHYLPREDNISOLONE 4 MG PO TBPK: ORAL_TABLET | ORAL | 0 refills | Status: DC

## 2019-02-11 NOTE — Progress Notes (Addendum)
Subjective:  Patient ID: [name], male    DOB: Jun 13, 1966  Age: 53 y.o. MRN: [id number]  CC: No chief complaint on file.   HPI [name] presents for DM, eczema, Vit D def, HTN C/o R leg weakness - worse; tripping, LLE locks backwards. Unable to flex R knee, R foot drop  Outpatient Medications Prior to Visit  Medication Sig Dispense Refill  . AMBULATORY NON FORMULARY MEDICATION AFO-Right Lower Extremity 1 Device 0  . aspirin-acetaminophen-caffeine (EXCEDRIN MIGRAINE) 756-433-29 MG tablet Take 2 tablets by mouth every 6 (six) hours as needed for headache.    . cephALEXin (KEFLEX) 500 MG capsule Take 2 capsules (1,000 mg total) by mouth 2 (two) times daily. 40 capsule 0  . cholecalciferol (VITAMIN D) 1000 UNITS tablet Take 1 tablet (1,000 Units total) by mouth daily. 100 tablet 3  . gabapentin (NEURONTIN) 300 MG capsule Take 1 capsule (300 mg total) by mouth 2 (two) times daily. TAKE ONE CAPSULE BY MOUTH EVERY NIGHT 180 capsule 1  . Ibuprofen-Famotidine (DUEXIS) 800-26.6 MG TABS Take 1 tablet by mouth 2 (two) times daily. 90 tablet 3  . levocetirizine (XYZAL) 5 MG tablet Take 1 tablet (5 mg total) by mouth every evening. 100 tablet 3  . losartan (COZAAR) 100 MG tablet Take 1 tablet (100 mg total) by mouth daily. 90 tablet 3  . metFORMIN (GLUCOPHAGE) 1000 MG tablet Take 1 tablet (1,000 mg total) by mouth 2 (two) times daily with a meal. 60 tablet 3  . naproxen sodium (ALEVE) 220 MG tablet Take 440 mg by mouth 2 (two) times daily as needed (headache).    . ondansetron (ZOFRAN) 4 MG tablet Take 1 tablet (4 mg total) by mouth every 8 (eight) hours as needed for nausea or vomiting. 30 tablet 0  . predniSONE (DELTASONE) 50 MG tablet Take 1 tablet (50 mg total) by mouth daily. 7 tablet 0  . SYNJARDY 12-998 MG TABS TAKE 1 TABLET BY MOUTH TWICE DAILY 180 tablet 3  . venlafaxine XR (EFFEXOR-XR) 150 MG 24 hr capsule TAKE 1 CAPSULE BY MOUTH DAILY WITH BREAKFAST 90 capsule 1  . vitamin B-12  (CYANOCOBALAMIN) 1000 MCG tablet Take 1 tablet (1,000 mcg total) by mouth daily. 100 tablet 3  . Vitamin D, Ergocalciferol, (DRISDOL) 1.25 MG (50000 UT) CAPS capsule TAKE 1 CAPSULE BY MOUTH EVERY 7 DAYS 12 capsule 0   No facility-administered medications prior to visit.     ROS: Review of Systems  Constitutional: Negative for appetite change, fatigue and unexpected weight change.  HENT: Negative for congestion, nosebleeds, sneezing, sore throat and trouble swallowing.   Eyes: Negative for itching and visual disturbance.  Respiratory: Negative for cough.   Cardiovascular: Negative for chest pain, palpitations and leg swelling.  Gastrointestinal: Negative for abdominal distention, blood in stool, diarrhea and nausea.  Genitourinary: Negative for frequency and hematuria.  Musculoskeletal: Positive for gait problem. Negative for back pain, joint swelling and neck pain.  Skin: Negative for rash.  Neurological: Positive for weakness. Negative for dizziness, tremors and speech difficulty.  Psychiatric/Behavioral: Negative for agitation, dysphoric mood and sleep disturbance. The patient is not nervous/anxious.     Objective:  BP 136/84 (BP Location: Left Arm, Patient Position: Sitting, Cuff Size: Large)   Pulse 86   Temp 97.8 F (36.6 C) (Oral)   Ht 6\' 1"  (1.854 m)   Wt 299 lb (135.6 kg)   SpO2 99%   BMI 39.45 kg/m   BP Readings from Last 3 Encounters:  02/11/19 136/84  09/18/18 132/76  03/29/18 124/82    Wt Readings from Last 3 Encounters:  02/11/19 299 lb (135.6 kg)  09/18/18 291 lb (132 kg)  03/29/18 298 lb (135.2 kg)    Physical Exam Constitutional:      General: He is not in acute distress.    Appearance: He is well-developed.     Comments: NAD  Eyes:     Conjunctiva/sclera: Conjunctivae normal.     Pupils: Pupils are equal, round, and reactive to light.  Neck:     Musculoskeletal: Normal range of motion.     Thyroid: No thyromegaly.     Vascular: No JVD.   Cardiovascular:     Rate and Rhythm: Normal rate and regular rhythm.     Heart sounds: Normal heart sounds. No murmur. No friction rub. No gallop.   Pulmonary:     Effort: Pulmonary effort is normal. No respiratory distress.     Breath sounds: Normal breath sounds. No wheezing or rales.  Chest:     Chest wall: No tenderness.  Abdominal:     General: Bowel sounds are normal. There is no distension.     Palpations: Abdomen is soft. There is no mass.     Tenderness: There is no abdominal tenderness. There is no guarding or rebound.  Musculoskeletal: Normal range of motion.        General: No tenderness.  Lymphadenopathy:     Cervical: No cervical adenopathy.  Skin:    General: Skin is warm and dry.     Findings: No rash.  Neurological:     Mental Status: He is alert and oriented to person, place, and time.     Cranial Nerves: No cranial nerve deficit.     Motor: Weakness present. No abnormal muscle tone.     Coordination: Coordination normal.     Gait: Gait normal.     Deep Tendon Reflexes: Reflexes are normal and symmetric.  Psychiatric:        Behavior: Behavior normal.        Thought Content: Thought content normal.        Judgment: Judgment normal.   R knee and R foot flexors are weak Present - instability of the knee and abnormal calf to thigh ratio R knee hyperextends back Mild foot drop  Lab Results  Component Value Date   WBC 5.7 09/18/2018   HGB 14.9 09/18/2018   HCT 44.1 09/18/2018   PLT 201.0 09/18/2018   GLUCOSE 84 09/18/2018   CHOL 175 09/18/2018   TRIG 55.0 09/18/2018   HDL 70.30 09/18/2018   LDLCALC 94 09/18/2018   ALT 17 09/18/2018   AST 21 09/18/2018   NA 138 09/18/2018   K 4.6 09/18/2018   CL 103 09/18/2018   CREATININE 1.72 (H) 09/18/2018   BUN 26 (H) 09/18/2018   CO2 28 09/18/2018   TSH 0.86 09/18/2018   PSA 0.97 10/01/2017   HGBA1C 6.2 09/18/2018   MICROALBUR 4.9 (H) 10/01/2017    US Renal  Result Date: 01/11/2018 CLINICAL DATA:   Chronic renal disease. EXAM: RENAL / URINARY TRACT ULTRASOUND COMPLETE COMPARISON:  None. FINDINGS: Right Kidney: Length: 11.4 cm. Echogenicity within normal limits. No mass or hydronephrosis visualized. Left Kidney: Length: 12.3 cm. Echogenicity within normal limits. No mass or hydronephrosis visualized. Bladder: Appears normal for degree of bladder distention. IMPRESSION: No significant abnormalities identified. Electronically Signed   By: Dorise Bullion III M.D   On: 01/11/2018 12:08    Assessment &  Plan:   There are no diagnoses linked to this encounter.   No orders of the defined types were placed in this encounter.    Follow-up: No follow-ups on file.  Walker Kehr, MD

## 2019-02-11 NOTE — Assessment & Plan Note (Addendum)
Custom knee brace - Ryan NS ref discussed w/Fletcher. He says he had an  LS spine MRI done recently Steroid taper- Medrol pac

## 2019-02-11 NOTE — Assessment & Plan Note (Signed)
Losartan 

## 2019-02-11 NOTE — Assessment & Plan Note (Signed)
Cont meds CBGs are good per pt Medrol pack effect on glucose was discusse

## 2019-02-15 ENCOUNTER — Other Ambulatory Visit: Payer: Self-pay | Admitting: Internal Medicine

## 2019-02-24 ENCOUNTER — Telehealth: Payer: Self-pay | Admitting: Internal Medicine

## 2019-02-24 DIAGNOSIS — M179 Osteoarthritis of knee, unspecified: Secondary | ICD-10-CM | POA: Insufficient documentation

## 2019-02-24 DIAGNOSIS — M1710 Unilateral primary osteoarthritis, unspecified knee: Secondary | ICD-10-CM | POA: Insufficient documentation

## 2019-02-24 NOTE — Assessment & Plan Note (Signed)
Chronic R Present - instability of the knee and abnormal calf to thigh ratio: needs a brace

## 2019-02-24 NOTE — Telephone Encounter (Signed)
Medication Refill - Medication: Vitamin D, Ergocalciferol, (DRISDOL) 1.25 MG (50000 UT) CAPS capsule [[id number]]   Has the patient contacted their pharmacy? Yes.   (Agent: If no, request that the patient contact the pharmacy for the refill.) (Agent: If yes, when and what did the pharmacy advise?)  Preferred Pharmacy (with phone number or street name):[id number] - [place], [place] - [address]   Agent: Please be advised that RX refills may take up to 3 business days. We ask that you follow-up with your pharmacy.

## 2019-02-25 ENCOUNTER — Other Ambulatory Visit: Payer: Self-pay

## 2019-02-25 MED ORDER — VITAMIN D (ERGOCALCIFEROL) 1.25 MG (50000 UNIT) PO CAPS: ORAL_CAPSULE | ORAL | 0 refills | Status: DC

## 2019-02-25 NOTE — Telephone Encounter (Signed)
Rx filled for patient.  

## 2019-02-25 NOTE — Telephone Encounter (Signed)
Filled by Dr. Tamala Julian

## 2019-03-20 ENCOUNTER — Ambulatory Visit: Payer: Self-pay | Admitting: Internal Medicine

## 2019-03-20 ENCOUNTER — Ambulatory Visit (INDEPENDENT_AMBULATORY_CARE_PROVIDER_SITE_OTHER): Payer: 59 | Admitting: Internal Medicine

## 2019-03-20 ENCOUNTER — Other Ambulatory Visit: Payer: Self-pay

## 2019-03-20 ENCOUNTER — Ambulatory Visit: Payer: 59 | Admitting: Internal Medicine

## 2019-03-20 ENCOUNTER — Encounter: Payer: Self-pay | Admitting: Internal Medicine

## 2019-03-20 DIAGNOSIS — W3400XS Accidental discharge from unspecified firearms or gun, sequela: Secondary | ICD-10-CM

## 2019-03-20 DIAGNOSIS — I10 Essential (primary) hypertension: Secondary | ICD-10-CM

## 2019-03-20 DIAGNOSIS — R29898 Other symptoms and signs involving the musculoskeletal system: Secondary | ICD-10-CM

## 2019-03-20 DIAGNOSIS — E1165 Type 2 diabetes mellitus with hyperglycemia: Secondary | ICD-10-CM

## 2019-03-20 DIAGNOSIS — Z6839 Body mass index (BMI) 39.0-39.9, adult: Secondary | ICD-10-CM

## 2019-03-20 DIAGNOSIS — G629 Polyneuropathy, unspecified: Secondary | ICD-10-CM

## 2019-03-20 DIAGNOSIS — W3400XA Accidental discharge from unspecified firearms or gun, initial encounter: Secondary | ICD-10-CM | POA: Insufficient documentation

## 2019-03-20 DIAGNOSIS — S31139S Puncture wound of abdominal wall without foreign body, unspecified quadrant without penetration into peritoneal cavity, sequela: Secondary | ICD-10-CM

## 2019-03-20 DIAGNOSIS — S31139A Puncture wound of abdominal wall without foreign body, unspecified quadrant without penetration into peritoneal cavity, initial encounter: Secondary | ICD-10-CM | POA: Insufficient documentation

## 2019-03-20 NOTE — Telephone Encounter (Signed)
I'm wanting to know if I can drink Gator Aid Zero because the doctor said not to drink beverages with artificial sweeteners.    I let him know that the Gator Aid Zero is sweetened with artificial sweeteners.   I let him know to check the ingredients and it should list what kind of sweetener it has in it.   He thanked me for my help.

## 2019-03-20 NOTE — Assessment & Plan Note (Signed)
Wt Readings from Last 3 Encounters:  03/20/19 296 lb (134.3 kg)  02/11/19 299 lb (135.6 kg)  09/18/18 291 lb (132 kg)

## 2019-03-20 NOTE — Assessment & Plan Note (Addendum)
[name]  thinks that his leg weakness is related to his [name] use.  He ran out of it for a few days and his muscle strength was 50% better according to his observation.  She has leg became weaker again when he went back on [name].  We will separate [name] and metformin and have the patient take them separately monitoring his leg weakness symptoms.  Diet discussed

## 2019-03-20 NOTE — Assessment & Plan Note (Signed)
Specialty ref discussed w/Deron. He says he had an  LS spine MRI done recently. Rehab Med ref Steroid taper- Medrol pac helped some   RLE weakness - better w/a knee brace: he can drive now and his R leg is stronger w/a brace off too. 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

## 2019-03-20 NOTE — Assessment & Plan Note (Signed)
Rehab Med ref

## 2019-03-20 NOTE — Assessment & Plan Note (Signed)
BP Readings from Last 3 Encounters:  03/20/19 (!) 140/96  02/11/19 136/84  09/18/18 132/76

## 2019-03-20 NOTE — Progress Notes (Signed)
Subjective:  Patient ID: [name], male    DOB: 14-Dec-1965  Age: 53 y.o. MRN: [id number]  CC: No chief complaint on file.   HPI [name] presents for RLE weakness - better w/a knee brace: he can drive now and his R leg is stronger w/a brace off too. 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 F/u DM, HTN No rash  Outpatient Medications Prior to Visit  Medication Sig Dispense Refill  . AMBULATORY NON FORMULARY MEDICATION AFO-Right Lower Extremity 1 Device 0  . aspirin-acetaminophen-caffeine (EXCEDRIN MIGRAINE) 003-704-88 MG tablet Take 2 tablets by mouth every 6 (six) hours as needed for headache.    . cholecalciferol (VITAMIN D) 1000 UNITS tablet Take 1 tablet (1,000 Units total) by mouth daily. 100 tablet 3  . gabapentin (NEURONTIN) 300 MG capsule Take 1 capsule (300 mg total) by mouth 2 (two) times daily. TAKE ONE CAPSULE BY MOUTH EVERY NIGHT 180 capsule 1  . Ibuprofen-Famotidine (DUEXIS) 800-26.6 MG TABS Take 1 tablet by mouth 2 (two) times daily. 90 tablet 3  . levocetirizine (XYZAL) 5 MG tablet Take 1 tablet (5 mg total) by mouth every evening. 100 tablet 3  . losartan (COZAAR) 100 MG tablet TAKE 1 TABLET(100 MG) BY MOUTH DAILY 90 tablet 3  . metFORMIN (GLUCOPHAGE) 1000 MG tablet Take 1 tablet (1,000 mg total) by mouth 2 (two) times daily with a meal. 60 tablet 3  . methylPREDNISolone (MEDROL DOSEPAK) 4 MG TBPK tablet As directed 21 tablet 0  . naproxen sodium (ALEVE) 220 MG tablet Take 440 mg by mouth 2 (two) times daily as needed (headache).    . ondansetron (ZOFRAN) 4 MG tablet Take 1 tablet (4 mg total) by mouth every 8 (eight) hours as needed for nausea or vomiting. 30 tablet 0  . predniSONE (DELTASONE) 50 MG tablet Take 1 tablet (50 mg total) by mouth daily. 7 tablet 0  . SYNJARDY 12-998 MG TABS TAKE 1 TABLET BY MOUTH TWICE DAILY 180 tablet 3  . venlafaxine XR (EFFEXOR-XR) 150 MG 24 hr capsule TAKE 1 CAPSULE BY MOUTH DAILY WITH BREAKFAST 90  capsule 1  . vitamin B-12 (CYANOCOBALAMIN) 1000 MCG tablet Take 1 tablet (1,000 mcg total) by mouth daily. 100 tablet 3  . Vitamin D, Ergocalciferol, (DRISDOL) 1.25 MG (50000 UT) CAPS capsule TAKE 1 CAPSULE BY MOUTH EVERY 7 DAYS 12 capsule 0  . cephALEXin (KEFLEX) 500 MG capsule Take 2 capsules (1,000 mg total) by mouth 2 (two) times daily. (Patient not taking: Reported on 03/20/2019) 40 capsule 0   No facility-administered medications prior to visit.     ROS: Review of Systems  Constitutional: Negative for appetite change, fatigue and unexpected weight change.  HENT: Negative for congestion, nosebleeds, sneezing, sore throat and trouble swallowing.   Eyes: Negative for itching and visual disturbance.  Respiratory: Negative for cough.   Cardiovascular: Negative for chest pain, palpitations and leg swelling.  Gastrointestinal: Negative for abdominal distention, blood in stool, diarrhea and nausea.  Genitourinary: Negative for frequency and hematuria.  Musculoskeletal: Positive for gait problem. Negative for back pain, joint swelling and neck pain.  Skin: Negative for rash.  Neurological: Positive for weakness. Negative for dizziness, tremors and speech difficulty.  Psychiatric/Behavioral: Negative for agitation, dysphoric mood, sleep disturbance and suicidal ideas. The patient is not nervous/anxious.     Objective:  BP (!) 140/96 (BP Location: Left Arm, Patient Position: Sitting, Cuff Size: Large)   Pulse 86   Temp 98.5 F (  36.9 C) (Oral)   Ht 6\' 1"  (1.854 m)   Wt 296 lb (134.3 kg)   SpO2 99%   BMI 39.05 kg/m   BP Readings from Last 3 Encounters:  03/20/19 (!) 140/96  02/11/19 136/84  09/18/18 132/76    Wt Readings from Last 3 Encounters:  03/20/19 296 lb (134.3 kg)  02/11/19 299 lb (135.6 kg)  09/18/18 291 lb (132 kg)    Physical Exam Constitutional:      General: He is not in acute distress.    Appearance: He is well-developed.     Comments: NAD  Eyes:      Conjunctiva/sclera: Conjunctivae normal.     Pupils: Pupils are equal, round, and reactive to light.  Neck:     Musculoskeletal: Normal range of motion.     Thyroid: No thyromegaly.     Vascular: No JVD.  Cardiovascular:     Rate and Rhythm: Normal rate and regular rhythm.     Heart sounds: Normal heart sounds. No murmur. No friction rub. No gallop.   Pulmonary:     Effort: Pulmonary effort is normal. No respiratory distress.     Breath sounds: Normal breath sounds. No wheezing or rales.  Chest:     Chest wall: No tenderness.  Abdominal:     General: Bowel sounds are normal. There is no distension.     Palpations: Abdomen is soft. There is no mass.     Tenderness: There is no abdominal tenderness. There is no guarding or rebound.  Musculoskeletal: Normal range of motion.        General: No tenderness.     Right lower leg: No edema.     Left lower leg: No edema.  Lymphadenopathy:     Cervical: No cervical adenopathy.  Skin:    General: Skin is warm and dry.     Findings: No rash.  Neurological:     Mental Status: He is alert and oriented to person, place, and time.     Cranial Nerves: No cranial nerve deficit.     Motor: Weakness present. No abnormal muscle tone.     Coordination: Coordination normal.     Gait: Gait abnormal.     Deep Tendon Reflexes: Reflexes are normal and symmetric.  Psychiatric:        Behavior: Behavior normal.        Thought Content: Thought content normal.        Judgment: Judgment normal.    R knee brace RLE less strong than LLE   Lab Results  Component Value Date   WBC 5.7 09/18/2018   HGB 14.9 09/18/2018   HCT 44.1 09/18/2018   PLT 201.0 09/18/2018   GLUCOSE 84 09/18/2018   CHOL 175 09/18/2018   TRIG 55.0 09/18/2018   HDL 70.30 09/18/2018   LDLCALC 94 09/18/2018   ALT 17 09/18/2018   AST 21 09/18/2018   NA 138 09/18/2018   K 4.6 09/18/2018   CL 103 09/18/2018   CREATININE 1.72 (H) 09/18/2018   BUN 26 (H) 09/18/2018   CO2 28  09/18/2018   TSH 0.86 09/18/2018   PSA 0.97 10/01/2017   HGBA1C 6.2 09/18/2018   MICROALBUR 4.9 (H) 10/01/2017    US Renal  Result Date: 01/11/2018 CLINICAL DATA:  Chronic renal disease. EXAM: RENAL / URINARY TRACT ULTRASOUND COMPLETE COMPARISON:  None. FINDINGS: Right Kidney: Length: 11.4 cm. Echogenicity within normal limits. No mass or hydronephrosis visualized. Left Kidney: Length: 12.3 cm. Echogenicity within normal limits. No  mass or hydronephrosis visualized. Bladder: Appears normal for degree of bladder distention. IMPRESSION: No significant abnormalities identified. Electronically Signed   By: Dorise Bullion III M.D   On: 01/11/2018 12:08    Assessment & Plan:   Diagnoses and all orders for this visit:  Uncontrolled type 2 diabetes mellitus with hyperglycemia ([place])  Essential hypertension  Right leg weakness  Class 2 severe obesity due to excess calories with serious comorbidity and body mass index (BMI) of 39.0 to 39.9 in adult ([place])     No orders of the defined types were placed in this encounter.    Follow-up: Return in about 4 months (around 07/21/2019) for a follow-up visit.  Walker Kehr, MD

## 2019-03-20 NOTE — Assessment & Plan Note (Addendum)
Specialty ref discussed w/Darrnell. He says he had an  LS spine MRI done recently. Rehab Med ref Steroid taper- Medrol pac helped some   RLE weakness - better w/a knee brace: he can drive now and his R leg is stronger w/a brace off too. 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 Rehab medicine referral-Dr. Tessa Lerner

## 2019-03-20 NOTE — Patient Instructions (Signed)

## 2019-04-09 ENCOUNTER — Ambulatory Visit: Payer: 59 | Admitting: Physical Therapy

## 2019-04-11 ENCOUNTER — Ambulatory Visit: Payer: 59 | Attending: Internal Medicine | Admitting: Physical Therapy

## 2019-04-11 ENCOUNTER — Other Ambulatory Visit: Payer: Self-pay

## 2019-04-11 DIAGNOSIS — R262 Difficulty in walking, not elsewhere classified: Secondary | ICD-10-CM

## 2019-04-11 DIAGNOSIS — R2689 Other abnormalities of gait and mobility: Secondary | ICD-10-CM

## 2019-04-11 DIAGNOSIS — M6281 Muscle weakness (generalized): Secondary | ICD-10-CM | POA: Insufficient documentation

## 2019-04-11 DIAGNOSIS — M62838 Other muscle spasm: Secondary | ICD-10-CM | POA: Diagnosis present

## 2019-04-11 NOTE — Patient Instructions (Signed)
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
## 2019-04-14 ENCOUNTER — Ambulatory Visit: Payer: 59

## 2019-04-14 ENCOUNTER — Other Ambulatory Visit: Payer: Self-pay

## 2019-04-14 DIAGNOSIS — M6281 Muscle weakness (generalized): Secondary | ICD-10-CM | POA: Diagnosis not present

## 2019-04-14 DIAGNOSIS — R2689 Other abnormalities of gait and mobility: Secondary | ICD-10-CM

## 2019-04-14 DIAGNOSIS — R262 Difficulty in walking, not elsewhere classified: Secondary | ICD-10-CM

## 2019-04-14 DIAGNOSIS — M62838 Other muscle spasm: Secondary | ICD-10-CM
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
## 2019-04-18 ENCOUNTER — Encounter: Payer: Self-pay | Admitting: Physical Therapy

## 2019-04-18 ENCOUNTER — Ambulatory Visit: Payer: 59 | Admitting: Physical Therapy

## 2019-04-18 ENCOUNTER — Other Ambulatory Visit: Payer: Self-pay

## 2019-04-18 DIAGNOSIS — M6281 Muscle weakness (generalized): Secondary | ICD-10-CM | POA: Diagnosis not present

## 2019-04-18 DIAGNOSIS — R262 Difficulty in walking, not elsewhere classified: Secondary | ICD-10-CM

## 2019-04-18 DIAGNOSIS — R2689 Other abnormalities of gait and mobility: Secondary | ICD-10-CM

## 2019-04-18 DIAGNOSIS — M62838 Other muscle spasm: Secondary | ICD-10-CM
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
## 2019-04-23 ENCOUNTER — Other Ambulatory Visit: Payer: Self-pay

## 2019-04-23 ENCOUNTER — Encounter: Payer: Self-pay | Admitting: Physical Therapy

## 2019-04-23 ENCOUNTER — Ambulatory Visit: Payer: 59 | Admitting: Physical Therapy

## 2019-04-23 ENCOUNTER — Telehealth: Payer: Self-pay | Admitting: *Deleted

## 2019-04-23 DIAGNOSIS — M6281 Muscle weakness (generalized): Secondary | ICD-10-CM

## 2019-04-23 DIAGNOSIS — R262 Difficulty in walking, not elsewhere classified: Secondary | ICD-10-CM

## 2019-04-23 DIAGNOSIS — R2689 Other abnormalities of gait and mobility: Secondary | ICD-10-CM

## 2019-04-23 DIAGNOSIS — M62838 Other muscle spasm: Secondary | ICD-10-CM

## 2019-04-23 NOTE — Telephone Encounter (Signed)
[name], Please write   Thanks, AP
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
## 2019-04-23 NOTE — Telephone Encounter (Signed)
Copied from [place] [id number]. Topic: General - Other >> Apr 22, 2019 12:33 PM Burchel, Abbi R wrote: Reason for CRM: Pt states he is having a hot tub installed and would like Dr Alain Marion to write a letter stating that the hot tub is therapeutic for pt's leg.    Pt: [phone]

## 2019-04-24 NOTE — Telephone Encounter (Addendum)
Letter printed, signed and mailed to patient. Pt informed.

## 2019-04-25 ENCOUNTER — Encounter: Payer: 59 | Admitting: Physical Therapy

## 2019-04-29 ENCOUNTER — Encounter: Payer: Self-pay | Admitting: Physical Therapy

## 2019-04-29 ENCOUNTER — Other Ambulatory Visit: Payer: Self-pay

## 2019-04-29 ENCOUNTER — Ambulatory Visit: Payer: 59 | Attending: Internal Medicine | Admitting: Physical Therapy

## 2019-04-29 DIAGNOSIS — M62838 Other muscle spasm: Secondary | ICD-10-CM | POA: Diagnosis present

## 2019-04-29 DIAGNOSIS — M6281 Muscle weakness (generalized): Secondary | ICD-10-CM

## 2019-04-29 DIAGNOSIS — R2689 Other abnormalities of gait and mobility: Secondary | ICD-10-CM | POA: Insufficient documentation

## 2019-04-29 DIAGNOSIS — R262 Difficulty in walking, not elsewhere classified: Secondary | ICD-10-CM | POA: Diagnosis present
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
## 2019-05-02 ENCOUNTER — Encounter: Payer: Self-pay | Admitting: Physical Therapy

## 2019-05-02 ENCOUNTER — Ambulatory Visit: Payer: 59 | Admitting: Physical Therapy

## 2019-05-02 ENCOUNTER — Other Ambulatory Visit: Payer: Self-pay

## 2019-05-02 DIAGNOSIS — M62838 Other muscle spasm: Secondary | ICD-10-CM

## 2019-05-02 DIAGNOSIS — M6281 Muscle weakness (generalized): Secondary | ICD-10-CM | POA: Diagnosis not present

## 2019-05-02 DIAGNOSIS — R2689 Other abnormalities of gait and mobility: Secondary | ICD-10-CM

## 2019-05-02 DIAGNOSIS — R262 Difficulty in walking, not elsewhere classified: Secondary | ICD-10-CM
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
## 2019-05-06 ENCOUNTER — Ambulatory Visit: Payer: 59

## 2019-05-07 ENCOUNTER — Ambulatory Visit: Payer: 59

## 2019-05-09 ENCOUNTER — Encounter: Payer: 59 | Admitting: Physical Therapy

## 2019-05-09 ENCOUNTER — Encounter

## 2019-05-13 ENCOUNTER — Other Ambulatory Visit: Payer: Self-pay

## 2019-05-13 ENCOUNTER — Ambulatory Visit: Payer: 59

## 2019-05-13 DIAGNOSIS — M6281 Muscle weakness (generalized): Secondary | ICD-10-CM | POA: Diagnosis not present

## 2019-05-13 DIAGNOSIS — M62838 Other muscle spasm: Secondary | ICD-10-CM

## 2019-05-13 DIAGNOSIS — R262 Difficulty in walking, not elsewhere classified: Secondary | ICD-10-CM

## 2019-05-13 DIAGNOSIS — R2689 Other abnormalities of gait and mobility: Secondary | ICD-10-CM
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
## 2019-05-16 ENCOUNTER — Encounter: Payer: Self-pay | Admitting: Physical Therapy

## 2019-05-16 ENCOUNTER — Other Ambulatory Visit: Payer: Self-pay

## 2019-05-16 ENCOUNTER — Ambulatory Visit: Payer: 59 | Admitting: Physical Therapy

## 2019-05-16 DIAGNOSIS — M6281 Muscle weakness (generalized): Secondary | ICD-10-CM | POA: Diagnosis not present

## 2019-05-16 DIAGNOSIS — M62838 Other muscle spasm: Secondary | ICD-10-CM

## 2019-05-16 DIAGNOSIS — R2689 Other abnormalities of gait and mobility: Secondary | ICD-10-CM

## 2019-05-16 DIAGNOSIS — R262 Difficulty in walking, not elsewhere classified: Secondary | ICD-10-CM
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
## 2019-05-23 ENCOUNTER — Ambulatory Visit: Payer: 59 | Admitting: Physical Therapy

## 2019-05-25 ENCOUNTER — Other Ambulatory Visit: Payer: Self-pay | Admitting: Internal Medicine

## 2019-05-29 ENCOUNTER — Ambulatory Visit: Payer: 59 | Admitting: Physical Therapy

## 2019-06-04 ENCOUNTER — Ambulatory Visit: Payer: 59 | Admitting: Physical Therapy

## 2019-06-10 ENCOUNTER — Ambulatory Visit: Payer: 59 | Attending: Internal Medicine | Admitting: Physical Therapy

## 2019-06-10 DIAGNOSIS — R262 Difficulty in walking, not elsewhere classified: Secondary | ICD-10-CM | POA: Insufficient documentation

## 2019-06-10 DIAGNOSIS — R2689 Other abnormalities of gait and mobility: Secondary | ICD-10-CM | POA: Insufficient documentation

## 2019-06-10 DIAGNOSIS — M6281 Muscle weakness (generalized): Secondary | ICD-10-CM | POA: Insufficient documentation

## 2019-06-10 DIAGNOSIS — M62838 Other muscle spasm: Secondary | ICD-10-CM | POA: Insufficient documentation

## 2019-06-11 ENCOUNTER — Encounter: Payer: Self-pay | Admitting: Physical Therapy

## 2019-06-11 ENCOUNTER — Ambulatory Visit: Payer: 59 | Admitting: Physical Therapy

## 2019-06-11 ENCOUNTER — Other Ambulatory Visit: Payer: Self-pay

## 2019-06-11 DIAGNOSIS — M62838 Other muscle spasm: Secondary | ICD-10-CM

## 2019-06-11 DIAGNOSIS — R262 Difficulty in walking, not elsewhere classified: Secondary | ICD-10-CM | POA: Diagnosis present

## 2019-06-11 DIAGNOSIS — M6281 Muscle weakness (generalized): Secondary | ICD-10-CM

## 2019-06-11 DIAGNOSIS — R2689 Other abnormalities of gait and mobility: Secondary | ICD-10-CM | POA: Diagnosis present
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
## 2019-06-17 ENCOUNTER — Ambulatory Visit: Payer: 59 | Admitting: Physical Therapy

## 2019-06-20 ENCOUNTER — Ambulatory Visit: Payer: 59

## 2019-06-25 ENCOUNTER — Ambulatory Visit: Payer: 59

## 2019-06-27 ENCOUNTER — Ambulatory Visit: Payer: 59 | Admitting: Physical Therapy

## 2019-07-02 ENCOUNTER — Other Ambulatory Visit: Payer: Self-pay

## 2019-07-02 ENCOUNTER — Encounter: Payer: Self-pay | Admitting: Internal Medicine

## 2019-07-02 ENCOUNTER — Ambulatory Visit (INDEPENDENT_AMBULATORY_CARE_PROVIDER_SITE_OTHER): Payer: 59 | Admitting: Internal Medicine

## 2019-07-02 ENCOUNTER — Ambulatory Visit: Payer: 59

## 2019-07-02 ENCOUNTER — Other Ambulatory Visit (INDEPENDENT_AMBULATORY_CARE_PROVIDER_SITE_OTHER): Payer: 59

## 2019-07-02 VITALS — BP 126/84 | HR 74 | Temp 98.5°F | Ht 73.0 in | Wt 280.0 lb

## 2019-07-02 DIAGNOSIS — Z6839 Body mass index (BMI) 39.0-39.9, adult: Secondary | ICD-10-CM

## 2019-07-02 DIAGNOSIS — M5410 Radiculopathy, site unspecified: Secondary | ICD-10-CM

## 2019-07-02 DIAGNOSIS — IMO0002 Reserved for concepts with insufficient information to code with codable children: Secondary | ICD-10-CM

## 2019-07-02 DIAGNOSIS — R29898 Other symptoms and signs involving the musculoskeletal system: Secondary | ICD-10-CM | POA: Diagnosis not present

## 2019-07-02 DIAGNOSIS — I10 Essential (primary) hypertension: Secondary | ICD-10-CM

## 2019-07-02 DIAGNOSIS — E1165 Type 2 diabetes mellitus with hyperglycemia: Secondary | ICD-10-CM | POA: Diagnosis not present

## 2019-07-02 LAB — BASIC METABOLIC PANEL
BUN: 22 mg/dL (ref 6–23)
CO2: 29 mEq/L (ref 19–32)
Calcium: 10.1 mg/dL (ref 8.4–10.5)
Chloride: 104 mEq/L (ref 96–112)
Creatinine, Ser: 1.7 mg/dL — ABNORMAL HIGH (ref 0.40–1.50)
GFR: 51.52 mL/min — ABNORMAL LOW (ref >60.00)
Glucose, Bld: 103 mg/dL — ABNORMAL HIGH (ref 70–99)
Potassium: 4.6 mEq/L (ref 3.5–5.1)
Sodium: 138 mEq/L (ref 135–145)

## 2019-07-02 LAB — HEMOGLOBIN A1C: Hgb A1c MFr Bld: 5.9 % (ref 4.6–6.5)

## 2019-07-02 NOTE — Assessment & Plan Note (Signed)
Wt Readings from Last 3 Encounters:  07/02/19 280 lb (127 kg)  03/20/19 296 lb (134.3 kg)  02/11/19 299 lb (135.6 kg)

## 2019-07-02 NOTE — Assessment & Plan Note (Signed)
Better now on [place]

## 2019-07-02 NOTE — Assessment & Plan Note (Signed)
radiculopathy in the RLE -asking for a NS ref

## 2019-07-02 NOTE — Progress Notes (Signed)
Subjective:  Patient ID: [name], male    DOB: [birth year]  Age: 53 y.o. MRN: [id number]  CC: No chief complaint on file.   HPI Demetrick Tirey Grissinger presents for DM, eczema, RLE weakness - limping F/u radiculopathy in the RLE -asking for a NS ref No rash Derrek lost wt   Outpatient Medications Prior to Visit  Medication Sig Dispense Refill  . AMBULATORY NON FORMULARY MEDICATION AFO-Right Lower Extremity 1 Device 0  . aspirin-acetaminophen-caffeine (EXCEDRIN MIGRAINE) [id number] MG tablet Take 2 tablets by mouth every 6 (six) hours as needed for headache.    . cholecalciferol (VITAMIN D) 1000 UNITS tablet Take 1 tablet (1,000 Units total) by mouth daily. 100 tablet 3  . gabapentin (NEURONTIN) 300 MG capsule TAKE ONE CAPSULE BY MOUTH TWICE DAILY AND 1 CAPSULE AT NIGHT 180 capsule 1  . levocetirizine (XYZAL) 5 MG tablet Take 1 tablet (5 mg total) by mouth every evening. 100 tablet 3  . losartan (COZAAR) 100 MG tablet TAKE 1 TABLET(100 MG) BY MOUTH DAILY 90 tablet 3  . metFORMIN (GLUCOPHAGE) 1000 MG tablet Take 1 tablet (1,000 mg total) by mouth 2 (two) times daily with a meal. 60 tablet 3  . naproxen sodium (ALEVE) 220 MG tablet Take 440 mg by mouth 2 (two) times daily as needed (headache).    . SYNJARDY 12-998 MG TABS TAKE 1 TABLET BY MOUTH TWICE DAILY 180 tablet 3  . vitamin B-12 (CYANOCOBALAMIN) 1000 MCG tablet Take 1 tablet (1,000 mcg total) by mouth daily. 100 tablet 3  . Vitamin D, Ergocalciferol, (DRISDOL) 1.25 MG (50000 UT) CAPS capsule TAKE 1 CAPSULE BY MOUTH EVERY 7 DAYS 12 capsule 0  . Ibuprofen-Famotidine (DUEXIS) 800-26.6 MG TABS Take 1 tablet by mouth 2 (two) times daily. (Patient not taking: Reported on 04/11/2019) 90 tablet 3  . venlafaxine XR (EFFEXOR-XR) 150 MG 24 hr capsule TAKE 1 CAPSULE BY MOUTH DAILY WITH BREAKFAST (Patient not taking: Reported on 04/11/2019) 90 capsule 1  . methylPREDNISolone (MEDROL DOSEPAK) 4 MG TBPK tablet As directed (Patient not taking: Reported on  04/11/2019) 21 tablet 0  . ondansetron (ZOFRAN) 4 MG tablet Take 1 tablet (4 mg total) by mouth every 8 (eight) hours as needed for nausea or vomiting. (Patient not taking: Reported on 04/11/2019) 30 tablet 0  . predniSONE (DELTASONE) 50 MG tablet Take 1 tablet (50 mg total) by mouth daily. (Patient not taking: Reported on 04/11/2019) 7 tablet 0   No facility-administered medications prior to visit.     ROS: Review of Systems  Constitutional: Negative for appetite change, fatigue and unexpected weight change.  HENT: Negative for congestion, nosebleeds, sneezing, sore throat and trouble swallowing.   Eyes: Negative for itching and visual disturbance.  Respiratory: Negative for cough.   Cardiovascular: Negative for chest pain, palpitations and leg swelling.  Gastrointestinal: Negative for abdominal distention, blood in stool, diarrhea and nausea.  Genitourinary: Negative for frequency and hematuria.  Musculoskeletal: Positive for gait problem. Negative for back pain, joint swelling and neck pain.  Skin: Negative for rash.  Neurological: Negative for dizziness, tremors, speech difficulty and weakness.  Psychiatric/Behavioral: Negative for agitation, dysphoric mood, sleep disturbance and suicidal ideas. The patient is not nervous/anxious.     Objective:  BP 126/84 (BP Location: Left Arm, Patient Position: Sitting, Cuff Size: Large)   Pulse 74   Temp 98.5 F (36.9 C) (Oral)   Ht 6\' 1"  (1.854 m)   Wt 280 lb (127 kg)   SpO2 94%  BMI 36.94 kg/m   BP Readings from Last 3 Encounters:  07/02/19 126/84  03/20/19 (!) 140/96  02/11/19 136/84    Wt Readings from Last 3 Encounters:  07/02/19 280 lb (127 kg)  03/20/19 296 lb (134.3 kg)  02/11/19 299 lb (135.6 kg)    Physical Exam Constitutional:      General: He is not in acute distress.    Appearance: He is well-developed.     Comments: NAD  Eyes:     General: Scleral icterus: neuros re.     Conjunctiva/sclera: Conjunctivae normal.      Pupils: Pupils are equal, round, and reactive to light.  Neck:     Musculoskeletal: Normal range of motion.     Thyroid: No thyromegaly.     Vascular: No JVD.  Cardiovascular:     Rate and Rhythm: Normal rate and regular rhythm.     Heart sounds: Normal heart sounds. No murmur. No friction rub. No gallop.   Pulmonary:     Effort: Pulmonary effort is normal. No respiratory distress.     Breath sounds: Normal breath sounds. No wheezing or rales.  Chest:     Chest wall: No tenderness.  Abdominal:     General: Bowel sounds are normal. There is no distension.     Palpations: Abdomen is soft. There is no mass.     Tenderness: There is no abdominal tenderness. There is no guarding or rebound.  Musculoskeletal: Normal range of motion.        General: No tenderness.  Lymphadenopathy:     Cervical: No cervical adenopathy.  Skin:    General: Skin is warm and dry.     Findings: No rash.  Neurological:     Mental Status: He is alert and oriented to person, place, and time.     Cranial Nerves: No cranial nerve deficit.     Motor: No abnormal muscle tone.     Coordination: Coordination abnormal.     Gait: Gait abnormal.     Deep Tendon Reflexes: Reflexes are normal and symmetric.  Psychiatric:        Behavior: Behavior normal.        Thought Content: Thought content normal.        Judgment: Judgment normal.    R leg is in a brace Weak RLE   Lab Results  Component Value Date   WBC 5.7 09/18/2018   HGB 14.9 09/18/2018   HCT 44.1 09/18/2018   PLT 201.0 09/18/2018   GLUCOSE 84 09/18/2018   CHOL 175 09/18/2018   TRIG 55.0 09/18/2018   HDL 70.30 09/18/2018   LDLCALC 94 09/18/2018   ALT 17 09/18/2018   AST 21 09/18/2018   NA 138 09/18/2018   K 4.6 09/18/2018   CL 103 09/18/2018   CREATININE 1.72 (H) 09/18/2018   BUN 26 (H) 09/18/2018   CO2 28 09/18/2018   TSH 0.86 09/18/2018   PSA 0.97 10/01/2017   HGBA1C 6.2 09/18/2018   MICROALBUR 4.9 (H) 10/01/2017    US Renal   Result Date: 01/11/2018 CLINICAL DATA:  Chronic renal disease. EXAM: RENAL / URINARY TRACT ULTRASOUND COMPLETE COMPARISON:  None. FINDINGS: Right Kidney: Length: 11.4 cm. Echogenicity within normal limits. No mass or hydronephrosis visualized. Left Kidney: Length: 12.3 cm. Echogenicity within normal limits. No mass or hydronephrosis visualized. Bladder: Appears normal for degree of bladder distention. IMPRESSION: No significant abnormalities identified. Electronically Signed   By: Dorise Bullion III M.D   On: 01/11/2018 12:08  Assessment & Plan:   There are no diagnoses linked to this encounter.   No orders of the defined types were placed in this encounter.    Follow-up: No follow-ups on file.  Walker Kehr, MD

## 2019-07-15 ENCOUNTER — Ambulatory Visit: Payer: 59 | Admitting: Internal Medicine

## 2019-08-11 DIAGNOSIS — W3400XA Accidental discharge from unspecified firearms or gun, initial encounter: Secondary | ICD-10-CM | POA: Insufficient documentation

## 2019-09-09 ENCOUNTER — Other Ambulatory Visit: Payer: Self-pay | Admitting: Internal Medicine

## 2019-09-12 ENCOUNTER — Encounter: Payer: 59 | Admitting: Physical Medicine & Rehabilitation

## 2019-09-23 ENCOUNTER — Encounter: Payer: 59 | Attending: Physical Medicine & Rehabilitation | Admitting: Physical Medicine & Rehabilitation

## 2019-09-23 ENCOUNTER — Encounter: Payer: Self-pay | Admitting: Physical Medicine & Rehabilitation

## 2019-09-23 ENCOUNTER — Other Ambulatory Visit: Payer: Self-pay

## 2019-09-23 VITALS — BP 139/85 | HR 63 | Ht 73.0 in | Wt 290.0 lb

## 2019-09-23 DIAGNOSIS — M5431 Sciatica, right side: Secondary | ICD-10-CM | POA: Insufficient documentation

## 2019-09-23 NOTE — Patient Instructions (Signed)
EMG/NCV was performed today.  Please follow-up with your primary doctor.  He should get the results in the next 1 to 2 days.

## 2019-09-24 ENCOUNTER — Ambulatory Visit (INDEPENDENT_AMBULATORY_CARE_PROVIDER_SITE_OTHER): Payer: 59 | Admitting: Internal Medicine

## 2019-09-24 ENCOUNTER — Encounter: Payer: Self-pay | Admitting: Internal Medicine

## 2019-09-24 VITALS — BP 138/86 | HR 68 | Temp 98.5°F | Ht 73.0 in | Wt 286.0 lb

## 2019-09-24 DIAGNOSIS — R29898 Other symptoms and signs involving the musculoskeletal system: Secondary | ICD-10-CM | POA: Diagnosis not present

## 2019-09-24 DIAGNOSIS — M542 Cervicalgia: Secondary | ICD-10-CM

## 2019-09-24 MED ORDER — KETOROLAC TROMETHAMINE 60 MG/2ML IM SOLN
60.0000 mg | Freq: Once | INTRAMUSCULAR | Status: AC
  Administered 2019-09-24: 60 mg via INTRAMUSCULAR

## 2019-09-24 MED ORDER — METHYLPREDNISOLONE 4 MG PO TBPK: ORAL_TABLET | ORAL | 0 refills | Status: DC

## 2019-09-24 MED ORDER — COENZYME Q10 30 MG PO CAPS: 30.0000 mg | ORAL_CAPSULE | Freq: Three times a day (TID) | ORAL | 1 refills | Status: DC

## 2019-09-24 NOTE — Patient Instructions (Addendum)
Use ice and/or heat on sore muscles Rest Stretch
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
## 2019-09-26 ENCOUNTER — Encounter: Payer: Self-pay | Admitting: Physical Medicine & Rehabilitation

## 2019-09-26 NOTE — Progress Notes (Signed)
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 There is no electrodiagnostic evidence of lumbar radiculopathy.

## 2019-09-29 ENCOUNTER — Other Ambulatory Visit: Payer: Self-pay | Admitting: Internal Medicine

## 2019-09-29 DIAGNOSIS — R29898 Other symptoms and signs involving the musculoskeletal system: Secondary | ICD-10-CM

## 2019-09-30 DIAGNOSIS — M542 Cervicalgia: Secondary | ICD-10-CM | POA: Insufficient documentation

## 2019-09-30 NOTE — Assessment & Plan Note (Signed)
Worsening relapsing post-traumatic neuropathy. New (+) Babinski on the R. Will ref to [facility] in [facility]
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
## 2019-10-08 ENCOUNTER — Ambulatory Visit (INDEPENDENT_AMBULATORY_CARE_PROVIDER_SITE_OTHER): Payer: 59 | Admitting: Internal Medicine

## 2019-10-08 ENCOUNTER — Other Ambulatory Visit: Payer: Self-pay

## 2019-10-08 ENCOUNTER — Encounter: Payer: Self-pay | Admitting: Internal Medicine

## 2019-10-08 DIAGNOSIS — R29898 Other symptoms and signs involving the musculoskeletal system: Secondary | ICD-10-CM

## 2019-10-08 DIAGNOSIS — M542 Cervicalgia: Secondary | ICD-10-CM | POA: Diagnosis not present

## 2019-10-08 DIAGNOSIS — M545 Low back pain, unspecified: Secondary | ICD-10-CM

## 2019-10-08 MED ORDER — TRAMADOL HCL 50 MG PO TABS: 50.0000 mg | ORAL_TABLET | Freq: Four times a day (QID) | ORAL | 0 refills | Status: AC | PRN

## 2019-10-08 MED ORDER — VITAMIN D3 50 MCG (2000 UT) PO CAPS: 2000.0000 [IU] | ORAL_CAPSULE | Freq: Every day | ORAL | 3 refills | Status: DC

## 2019-10-08 NOTE — Patient Instructions (Addendum)
Try trekking poles    Massage Gun for Athletes, Portable Body Muscle Massager Professional Deep Tissue Massage Gun for Pain Relief with 6 Massag.Marland KitchenMarland Kitchen
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
## 2019-10-08 NOTE — Assessment & Plan Note (Signed)
Not better Tramadol prn Vit D Ref for PT: integrative Therapies
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
## 2019-10-08 NOTE — Assessment & Plan Note (Signed)
[name] was ref to [facility]

## 2019-10-30 ENCOUNTER — Encounter: Payer: Self-pay | Admitting: Internal Medicine

## 2019-10-30 ENCOUNTER — Ambulatory Visit: Payer: 59 | Admitting: Internal Medicine

## 2019-10-30 ENCOUNTER — Other Ambulatory Visit: Payer: Self-pay

## 2019-10-30 DIAGNOSIS — R29898 Other symptoms and signs involving the musculoskeletal system: Secondary | ICD-10-CM

## 2019-10-30 DIAGNOSIS — E119 Type 2 diabetes mellitus without complications: Secondary | ICD-10-CM | POA: Diagnosis not present

## 2019-10-30 DIAGNOSIS — I10 Essential (primary) hypertension: Secondary | ICD-10-CM

## 2019-10-30 MED ORDER — TRAMADOL HCL 50 MG PO TABS: 50.0000 mg | ORAL_TABLET | Freq: Three times a day (TID) | ORAL | 0 refills | Status: AC | PRN

## 2019-10-30 NOTE — Assessment & Plan Note (Signed)
LBP, cerv pain - better w/PT Tramadol prn

## 2019-10-30 NOTE — Assessment & Plan Note (Addendum)
Doing well [name] po

## 2019-10-30 NOTE — Assessment & Plan Note (Signed)
Losartan 
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
## 2019-10-30 NOTE — Assessment & Plan Note (Signed)
Form for DMV - filled out Plenty of strength in the foot for operating car pedals

## 2019-11-10 ENCOUNTER — Encounter: Payer: Self-pay | Admitting: Internal Medicine

## 2019-11-28 ENCOUNTER — Ambulatory Visit: Payer: 59

## 2019-12-24 DIAGNOSIS — M4800 Spinal stenosis, site unspecified: Secondary | ICD-10-CM | POA: Insufficient documentation

## 2019-12-25 DIAGNOSIS — Z981 Arthrodesis status: Secondary | ICD-10-CM | POA: Insufficient documentation

## 2020-01-08 MED ORDER — INSULIN ASPART 100 UNIT/ML FLEXPEN
0.00 | PEN_INJECTOR | SUBCUTANEOUS | Status: DC
Start: 2020-01-08 — End: 2020-01-08

## 2020-01-08 MED ORDER — PROMETHAZINE HCL 25 MG/ML IJ SOLN
6.25 | INTRAMUSCULAR | Status: DC
Start: ? — End: 2020-01-08

## 2020-01-08 MED ORDER — POLYETHYLENE GLYCOL 3350 17 GM/SCOOP PO POWD
17.00 | ORAL | Status: DC
Start: 2020-01-07 — End: 2020-01-08

## 2020-01-08 MED ORDER — HEPARIN SODIUM (PORCINE) 5000 UNIT/ML IJ SOLN
5000.00 | INTRAMUSCULAR | Status: DC
Start: 2020-01-07 — End: 2020-01-08

## 2020-01-08 MED ORDER — METHOCARBAMOL 500 MG PO TABS
1000.00 | ORAL_TABLET | ORAL | Status: DC
Start: ? — End: 2020-01-08

## 2020-01-08 MED ORDER — LACTATED RINGERS IV SOLN
100.00 | INTRAVENOUS | Status: DC
Start: ? — End: 2020-01-08

## 2020-01-08 MED ORDER — BENZOCAINE-MENTHOL 15-3.6 MG MT LOZG
1.00 | LOZENGE | OROMUCOSAL | Status: DC
Start: ? — End: 2020-01-08

## 2020-01-08 MED ORDER — CYANOCOBALAMIN 500 MCG PO TABS
1000.00 | ORAL_TABLET | ORAL | Status: DC
Start: 2020-01-08 — End: 2020-01-08

## 2020-01-08 MED ORDER — ACETAMINOPHEN 500 MG PO TABS
1000.00 | ORAL_TABLET | ORAL | Status: DC
Start: 2020-01-07 — End: 2020-01-08

## 2020-01-08 MED ORDER — BISACODYL 10 MG RE SUPP
10.00 | RECTAL | Status: DC
Start: ? — End: 2020-01-08

## 2020-01-08 MED ORDER — GABAPENTIN 300 MG PO CAPS
300.00 | ORAL_CAPSULE | ORAL | Status: DC
Start: 2020-01-07 — End: 2020-01-08

## 2020-01-08 MED ORDER — CHOLECALCIFEROL 25 MCG (1000 UT) PO TABS
25.00 | ORAL_TABLET | ORAL | Status: DC
Start: 2020-01-08 — End: 2020-01-08

## 2020-01-08 MED ORDER — MAGNESIUM HYDROXIDE 400 MG/5ML PO SUSP
30.00 | ORAL | Status: DC
Start: ? — End: 2020-01-08

## 2020-01-08 MED ORDER — NALOXONE HCL 0.4 MG/ML IJ SOLN
0.10 | INTRAMUSCULAR | Status: DC
Start: ? — End: 2020-01-08

## 2020-01-08 MED ORDER — SENNOSIDES-DOCUSATE SODIUM 8.6-50 MG PO TABS
1.00 | ORAL_TABLET | ORAL | Status: DC
Start: 2020-01-07 — End: 2020-01-08

## 2020-01-08 MED ORDER — NALOXONE HCL 0.4 MG/ML IJ SOLN
0.20 | INTRAMUSCULAR | Status: DC
Start: ? — End: 2020-01-08

## 2020-01-08 MED ORDER — FAMOTIDINE 20 MG PO TABS
20.00 | ORAL_TABLET | ORAL | Status: DC
Start: 2020-01-07 — End: 2020-01-08

## 2020-01-08 MED ORDER — ONDANSETRON HCL 4 MG/2ML IJ SOLN
4.00 | INTRAMUSCULAR | Status: DC
Start: ? — End: 2020-01-08

## 2020-01-08 MED ORDER — GENERIC EXTERNAL MEDICATION
Status: DC
Start: ? — End: 2020-01-08

## 2020-01-08 MED ORDER — LOSARTAN POTASSIUM 50 MG PO TABS
100.00 | ORAL_TABLET | ORAL | Status: DC
Start: 2020-01-08 — End: 2020-01-08

## 2020-01-09 ENCOUNTER — Telehealth: Payer: Self-pay | Admitting: Internal Medicine

## 2020-01-09 NOTE — Telephone Encounter (Signed)
Please advise about outpatient facility

## 2020-01-09 NOTE — Telephone Encounter (Signed)
New Message:   Pt is calling and states he needs to be set up for a outpatient facility so he can begin to walk again from his back surgery he had in [place]. Pt states he would also like to know where he can get a wheelchair. Please advise.

## 2020-01-10 NOTE — Telephone Encounter (Signed)
Please send a prescription for wheelchair to a medical supply store.  We need to refer [name] to physical therapy?  Thanks

## 2020-01-12 NOTE — Telephone Encounter (Signed)
Patient calling and is requesting a call back from [place].

## 2020-01-12 NOTE — Telephone Encounter (Signed)
VOV made for tomorrow to have proper documentation for insurance

## 2020-01-12 NOTE — Telephone Encounter (Signed)
New Message:   Pt is also requesting in home physical therapy and a in home nurse if possible. Please advise.

## 2020-01-13 ENCOUNTER — Encounter: Payer: Self-pay | Admitting: Internal Medicine

## 2020-01-13 ENCOUNTER — Telehealth (INDEPENDENT_AMBULATORY_CARE_PROVIDER_SITE_OTHER): Payer: 59 | Admitting: Internal Medicine

## 2020-01-13 ENCOUNTER — Telehealth: Payer: Self-pay

## 2020-01-13 DIAGNOSIS — M4324 Fusion of spine, thoracic region: Secondary | ICD-10-CM

## 2020-01-13 DIAGNOSIS — Z9889 Other specified postprocedural states: Secondary | ICD-10-CM

## 2020-01-13 DIAGNOSIS — M5104 Intervertebral disc disorders with myelopathy, thoracic region: Secondary | ICD-10-CM | POA: Diagnosis not present

## 2020-01-13 DIAGNOSIS — R29898 Other symptoms and signs involving the musculoskeletal system: Secondary | ICD-10-CM | POA: Diagnosis not present

## 2020-01-13 MED ORDER — CYCLOBENZAPRINE HCL 5 MG PO TABS
5.0000 mg | ORAL_TABLET | Freq: Three times a day (TID) | ORAL | 1 refills | Status: DC | PRN
Start: 2020-01-13 — End: 2020-08-19

## 2020-01-13 MED ORDER — SENNOSIDES-DOCUSATE SODIUM 8.6-50 MG PO TABS: 2.0000 | ORAL_TABLET | Freq: Two times a day (BID) | ORAL | 6 refills | Status: DC

## 2020-01-13 MED ORDER — OXYCODONE-ACETAMINOPHEN 5-325 MG PO TABS: 1.0000 | ORAL_TABLET | Freq: Four times a day (QID) | ORAL | 0 refills | Status: DC | PRN

## 2020-01-13 MED ORDER — MILK OF MAGNESIA 7.75 % PO SUSP
ORAL | 0 refills | Status: DC
Start: 2020-01-13 — End: 2020-08-19

## 2020-01-13 NOTE — Assessment & Plan Note (Signed)
Start PT -s/p thoracic back surgery T8-12 fusion at [facility]

## 2020-01-13 NOTE — Progress Notes (Signed)
Virtual Visit via Video Note  I connected with [name] on 01/13/20 at 11:00 AM EDT by a video enabled telemedicine application and verified that I am speaking with the correct person using two identifiers.   I discussed the limitations of evaluation and management by telemedicine and the availability of in person appointments. The patient expressed understanding and agreed to proceed.  History of Present Illness:  C/o back pain  --- s/p thoracic back surgery T8-12 fusion at [facility] 2 wks ago Needs to start PT C/o constipation on pain meds  There has been no cough, chest pain, shortness of breath, abdominal pain, diarrhea,  arthralgias, skin rashes.   Observations/Objective: The patient appears to be in no acute distress, scar picture looks clean  Assessment and Plan:  See my Assessment and Plan. Follow Up Instructions:    I discussed the assessment and treatment plan with the patient. The patient was provided an opportunity to ask questions and all were answered. The patient agreed with the plan and demonstrated an understanding of the instructions.   The patient was advised to call back or seek an in-person evaluation if the symptoms worsen or if the condition fails to improve as anticipated.  I provided face-to-face time during this encounter. We were at different locations.   Walker Kehr, MD

## 2020-01-13 NOTE — Assessment & Plan Note (Addendum)
Start PT - needs to rehab after thoracic back surgery T8-12 fusion at [facility], leg weakness W/c, commode extension

## 2020-01-13 NOTE — Telephone Encounter (Signed)
New message    [employer] calling they do not a carried Toilet frame, shower stool, they are currently out of the wheelchair.    Please advise.

## 2020-01-13 NOTE — Telephone Encounter (Signed)
Order faxed to [facility], pt informed

## 2020-01-14 NOTE — Telephone Encounter (Signed)
MD placed referral on 01/12/20.Marland KitchenJohny [name]

## 2020-01-14 NOTE — Telephone Encounter (Signed)
Referral has been sent to [facility]. I informed pt through his mychart message

## 2020-01-14 NOTE — Telephone Encounter (Signed)
F/u   The patient voiced checking on the status of referral for physical therapy.   Facility Name: Outpatient rehab with [facility]   Phone # [phone]   Fax #  [phone]

## 2020-01-15 ENCOUNTER — Other Ambulatory Visit: Payer: Self-pay

## 2020-01-15 ENCOUNTER — Encounter: Payer: Self-pay | Admitting: Physical Therapy

## 2020-01-15 ENCOUNTER — Ambulatory Visit: Payer: 59 | Attending: Internal Medicine | Admitting: Physical Therapy

## 2020-01-15 DIAGNOSIS — M62838 Other muscle spasm: Secondary | ICD-10-CM | POA: Diagnosis present

## 2020-01-15 DIAGNOSIS — R29818 Other symptoms and signs involving the nervous system: Secondary | ICD-10-CM | POA: Insufficient documentation

## 2020-01-15 DIAGNOSIS — M6281 Muscle weakness (generalized): Secondary | ICD-10-CM | POA: Insufficient documentation

## 2020-01-15 DIAGNOSIS — R262 Difficulty in walking, not elsewhere classified: Secondary | ICD-10-CM | POA: Diagnosis present

## 2020-01-15 DIAGNOSIS — R2689 Other abnormalities of gait and mobility: Secondary | ICD-10-CM | POA: Insufficient documentation

## 2020-01-15 DIAGNOSIS — M546 Pain in thoracic spine: Secondary | ICD-10-CM | POA: Diagnosis present
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
## 2020-01-19 ENCOUNTER — Telehealth: Payer: Self-pay

## 2020-01-19 NOTE — Telephone Encounter (Signed)
Duplicate message. The patient sent a message to PCP for advisement.

## 2020-01-19 NOTE — Telephone Encounter (Signed)
New message    The patient voiced asking for a call back from the [facility]  Surgery was performed  @ [facility] on 4.29.21  Was advised imagining test can be done locally general imagining exam  That was noted on the paperwork.

## 2020-01-20 ENCOUNTER — Other Ambulatory Visit: Payer: Self-pay

## 2020-01-20 ENCOUNTER — Ambulatory Visit: Payer: 59

## 2020-01-20 DIAGNOSIS — M6281 Muscle weakness (generalized): Secondary | ICD-10-CM

## 2020-01-20 DIAGNOSIS — R262 Difficulty in walking, not elsewhere classified: Secondary | ICD-10-CM

## 2020-01-20 DIAGNOSIS — M62838 Other muscle spasm: Secondary | ICD-10-CM

## 2020-01-20 DIAGNOSIS — M546 Pain in thoracic spine: Secondary | ICD-10-CM

## 2020-01-20 DIAGNOSIS — R2689 Other abnormalities of gait and mobility: Secondary | ICD-10-CM

## 2020-01-20 DIAGNOSIS — R29818 Other symptoms and signs involving the nervous system: Secondary | ICD-10-CM
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
## 2020-01-21 ENCOUNTER — Ambulatory Visit: Payer: 59

## 2020-01-21 DIAGNOSIS — R262 Difficulty in walking, not elsewhere classified: Secondary | ICD-10-CM

## 2020-01-21 DIAGNOSIS — M6281 Muscle weakness (generalized): Secondary | ICD-10-CM

## 2020-01-21 DIAGNOSIS — R29818 Other symptoms and signs involving the nervous system: Secondary | ICD-10-CM

## 2020-01-21 DIAGNOSIS — M62838 Other muscle spasm: Secondary | ICD-10-CM

## 2020-01-21 DIAGNOSIS — M546 Pain in thoracic spine: Secondary | ICD-10-CM

## 2020-01-21 DIAGNOSIS — R2689 Other abnormalities of gait and mobility: Secondary | ICD-10-CM
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
## 2020-01-22 ENCOUNTER — Ambulatory Visit: Payer: 59

## 2020-01-23 ENCOUNTER — Other Ambulatory Visit: Payer: Self-pay | Admitting: Internal Medicine

## 2020-01-23 DIAGNOSIS — M4324 Fusion of spine, thoracic region: Secondary | ICD-10-CM

## 2020-01-23 DIAGNOSIS — Z9889 Other specified postprocedural states: Secondary | ICD-10-CM

## 2020-01-23 NOTE — Progress Notes (Signed)
Thor spine  xray

## 2020-01-27 ENCOUNTER — Other Ambulatory Visit: Payer: Self-pay

## 2020-01-27 ENCOUNTER — Ambulatory Visit (INDEPENDENT_AMBULATORY_CARE_PROVIDER_SITE_OTHER)
Admission: RE | Admit: 2020-01-27 | Discharge: 2020-01-27 | Disposition: A | Discharge status: Home / Self Care | Payer: 59 | Source: Ambulatory Visit | Attending: Internal Medicine | Admitting: Internal Medicine

## 2020-01-27 DIAGNOSIS — Z9889 Other specified postprocedural states: Secondary | ICD-10-CM | POA: Diagnosis not present

## 2020-01-27 DIAGNOSIS — M4324 Fusion of spine, thoracic region: Secondary | ICD-10-CM | POA: Diagnosis not present

## 2020-01-28 ENCOUNTER — Encounter: Payer: Self-pay | Admitting: Physical Therapy

## 2020-01-28 ENCOUNTER — Telehealth: Payer: Self-pay

## 2020-01-28 ENCOUNTER — Ambulatory Visit: Payer: 59 | Attending: Internal Medicine | Admitting: Physical Therapy

## 2020-01-28 DIAGNOSIS — R262 Difficulty in walking, not elsewhere classified: Secondary | ICD-10-CM | POA: Insufficient documentation

## 2020-01-28 DIAGNOSIS — M6281 Muscle weakness (generalized): Secondary | ICD-10-CM | POA: Diagnosis present

## 2020-01-28 DIAGNOSIS — M62838 Other muscle spasm: Secondary | ICD-10-CM | POA: Insufficient documentation

## 2020-01-28 DIAGNOSIS — R2689 Other abnormalities of gait and mobility: Secondary | ICD-10-CM | POA: Insufficient documentation

## 2020-01-28 DIAGNOSIS — M546 Pain in thoracic spine: Secondary | ICD-10-CM | POA: Insufficient documentation

## 2020-01-28 DIAGNOSIS — R29818 Other symptoms and signs involving the nervous system: Secondary | ICD-10-CM | POA: Diagnosis present

## 2020-01-28 NOTE — Telephone Encounter (Signed)
[facility] with request that Dr Alain Marion viewed recent thoracic spine x-ray impression: Question mild loss of height at T8 when compared to previous imaging. CT may be helpful for further assessment.

## 2020-01-28 NOTE — Telephone Encounter (Signed)
[facility] called with request that Dr Alain Marion view the impression of most recent thoracic spine x-ray - Question mild loss of height at T8 when compared to previous imaging. CT may be helpful for further assessment.
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
## 2020-01-29 NOTE — Telephone Encounter (Signed)
[name] is going to discuss it with his neurosurgeon.  He just had a spinal fusion at [facility] - Dr Bridgett Larsson will probably advise on what we have to do.  Thanks

## 2020-01-30 ENCOUNTER — Ambulatory Visit: Payer: 59

## 2020-01-30 ENCOUNTER — Other Ambulatory Visit: Payer: Self-pay

## 2020-01-30 DIAGNOSIS — M62838 Other muscle spasm: Secondary | ICD-10-CM

## 2020-01-30 DIAGNOSIS — R262 Difficulty in walking, not elsewhere classified: Secondary | ICD-10-CM

## 2020-01-30 DIAGNOSIS — M6281 Muscle weakness (generalized): Secondary | ICD-10-CM | POA: Diagnosis not present

## 2020-01-30 DIAGNOSIS — M546 Pain in thoracic spine: Secondary | ICD-10-CM

## 2020-01-30 DIAGNOSIS — R2689 Other abnormalities of gait and mobility: Secondary | ICD-10-CM

## 2020-01-30 DIAGNOSIS — R29818 Other symptoms and signs involving the nervous system: Secondary | ICD-10-CM
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
## 2020-02-03 ENCOUNTER — Ambulatory Visit: Payer: 59

## 2020-02-03 ENCOUNTER — Other Ambulatory Visit: Payer: Self-pay

## 2020-02-03 DIAGNOSIS — R2689 Other abnormalities of gait and mobility: Secondary | ICD-10-CM

## 2020-02-03 DIAGNOSIS — M6281 Muscle weakness (generalized): Secondary | ICD-10-CM

## 2020-02-03 DIAGNOSIS — M62838 Other muscle spasm: Secondary | ICD-10-CM

## 2020-02-03 DIAGNOSIS — R29818 Other symptoms and signs involving the nervous system: Secondary | ICD-10-CM

## 2020-02-03 DIAGNOSIS — M546 Pain in thoracic spine: Secondary | ICD-10-CM

## 2020-02-03 DIAGNOSIS — R262 Difficulty in walking, not elsewhere classified: Secondary | ICD-10-CM
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
## 2020-02-05 ENCOUNTER — Other Ambulatory Visit: Payer: Self-pay

## 2020-02-05 ENCOUNTER — Ambulatory Visit: Payer: 59

## 2020-02-05 DIAGNOSIS — R29818 Other symptoms and signs involving the nervous system: Secondary | ICD-10-CM

## 2020-02-05 DIAGNOSIS — R262 Difficulty in walking, not elsewhere classified: Secondary | ICD-10-CM

## 2020-02-05 DIAGNOSIS — R2689 Other abnormalities of gait and mobility: Secondary | ICD-10-CM

## 2020-02-05 DIAGNOSIS — M6281 Muscle weakness (generalized): Secondary | ICD-10-CM

## 2020-02-05 DIAGNOSIS — M546 Pain in thoracic spine: Secondary | ICD-10-CM
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
## 2020-02-10 ENCOUNTER — Ambulatory Visit: Payer: 59

## 2020-02-10 ENCOUNTER — Other Ambulatory Visit: Payer: Self-pay

## 2020-02-10 DIAGNOSIS — R2689 Other abnormalities of gait and mobility: Secondary | ICD-10-CM

## 2020-02-10 DIAGNOSIS — R262 Difficulty in walking, not elsewhere classified: Secondary | ICD-10-CM

## 2020-02-10 DIAGNOSIS — M546 Pain in thoracic spine: Secondary | ICD-10-CM

## 2020-02-10 DIAGNOSIS — R29818 Other symptoms and signs involving the nervous system: Secondary | ICD-10-CM

## 2020-02-10 DIAGNOSIS — M6281 Muscle weakness (generalized): Secondary | ICD-10-CM

## 2020-02-10 MED ORDER — GABAPENTIN 300 MG PO CAPS: ORAL_CAPSULE | ORAL | 1 refills | Status: DC
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
## 2020-02-11 ENCOUNTER — Other Ambulatory Visit: Payer: Self-pay

## 2020-02-11 DIAGNOSIS — Z9889 Other specified postprocedural states: Secondary | ICD-10-CM

## 2020-02-12 ENCOUNTER — Other Ambulatory Visit: Payer: Self-pay

## 2020-02-12 ENCOUNTER — Ambulatory Visit: Payer: 59 | Admitting: Physical Therapy

## 2020-02-12 ENCOUNTER — Encounter: Payer: Self-pay | Admitting: Physical Therapy

## 2020-02-12 ENCOUNTER — Telehealth: Payer: Self-pay

## 2020-02-12 DIAGNOSIS — R2689 Other abnormalities of gait and mobility: Secondary | ICD-10-CM

## 2020-02-12 DIAGNOSIS — M6281 Muscle weakness (generalized): Secondary | ICD-10-CM

## 2020-02-12 DIAGNOSIS — M546 Pain in thoracic spine: Secondary | ICD-10-CM

## 2020-02-12 DIAGNOSIS — R29818 Other symptoms and signs involving the nervous system: Secondary | ICD-10-CM

## 2020-02-12 DIAGNOSIS — R262 Difficulty in walking, not elsewhere classified: Secondary | ICD-10-CM
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
## 2020-02-12 NOTE — Telephone Encounter (Signed)
See 02/11/20 pt message, order has been faxed. Pt informed via mychart

## 2020-02-12 NOTE — Telephone Encounter (Signed)
New message   What type of DME ([place]) would like your provider to order? Wheelchair   Who would like to get the DME from? [facility] fax # [phone], this is cover by his insurance   Last visit with PCP (>3 months requires and appointment for insurance to cover the cost = please schedule patient for visit to discuss medical necessity for DME)? 5.18.21

## 2020-02-13 ENCOUNTER — Other Ambulatory Visit: Payer: Self-pay | Admitting: Internal Medicine

## 2020-02-13 NOTE — Telephone Encounter (Signed)
New message:   [name] is calling to let us know that they are currently out of wheelchairs. Please advise.

## 2020-02-13 NOTE — Telephone Encounter (Signed)
Tried calling pt to inform him that [facility] does not carry wheelchairs, unable to reach pt

## 2020-02-13 NOTE — Telephone Encounter (Signed)
See 02/13/2020 pt message. Order faxed to [place].

## 2020-02-13 NOTE — Telephone Encounter (Signed)
    [facility] calling, states they do not carry wheelchairs  (No DME)

## 2020-02-16 NOTE — Telephone Encounter (Signed)
See 02/16/2020 pt mychart message

## 2020-02-17 ENCOUNTER — Ambulatory Visit: Payer: 59 | Admitting: Physical Therapy

## 2020-02-17 ENCOUNTER — Encounter: Payer: Self-pay | Admitting: Physical Therapy

## 2020-02-17 ENCOUNTER — Other Ambulatory Visit: Payer: Self-pay

## 2020-02-17 DIAGNOSIS — M546 Pain in thoracic spine: Secondary | ICD-10-CM

## 2020-02-17 DIAGNOSIS — R262 Difficulty in walking, not elsewhere classified: Secondary | ICD-10-CM

## 2020-02-17 DIAGNOSIS — R29818 Other symptoms and signs involving the nervous system: Secondary | ICD-10-CM

## 2020-02-17 DIAGNOSIS — M6281 Muscle weakness (generalized): Secondary | ICD-10-CM

## 2020-02-17 DIAGNOSIS — R2689 Other abnormalities of gait and mobility: Secondary | ICD-10-CM
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
## 2020-02-19 ENCOUNTER — Ambulatory Visit: Payer: 59 | Admitting: Physical Therapy

## 2020-02-20 ENCOUNTER — Ambulatory Visit: Payer: 59 | Admitting: Physical Therapy

## 2020-02-20 ENCOUNTER — Other Ambulatory Visit: Payer: Self-pay

## 2020-02-20 DIAGNOSIS — M546 Pain in thoracic spine: Secondary | ICD-10-CM

## 2020-02-20 DIAGNOSIS — M6281 Muscle weakness (generalized): Secondary | ICD-10-CM

## 2020-02-20 DIAGNOSIS — R29818 Other symptoms and signs involving the nervous system: Secondary | ICD-10-CM

## 2020-02-20 DIAGNOSIS — R262 Difficulty in walking, not elsewhere classified: Secondary | ICD-10-CM

## 2020-02-20 DIAGNOSIS — R2689 Other abnormalities of gait and mobility: Secondary | ICD-10-CM
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
## 2020-02-20 NOTE — Patient Instructions (Signed)
  Do at a counter for safety - lift left hip up towards ribs then lower down  To make harder - place hands on cabinets above counter

## 2020-02-24 ENCOUNTER — Encounter: Payer: Self-pay | Admitting: Physical Therapy

## 2020-02-24 ENCOUNTER — Ambulatory Visit: Payer: 59 | Admitting: Physical Therapy

## 2020-02-24 ENCOUNTER — Other Ambulatory Visit: Payer: Self-pay

## 2020-02-24 DIAGNOSIS — R262 Difficulty in walking, not elsewhere classified: Secondary | ICD-10-CM

## 2020-02-24 DIAGNOSIS — M62838 Other muscle spasm: Secondary | ICD-10-CM

## 2020-02-24 DIAGNOSIS — M546 Pain in thoracic spine: Secondary | ICD-10-CM

## 2020-02-24 DIAGNOSIS — R29818 Other symptoms and signs involving the nervous system: Secondary | ICD-10-CM

## 2020-02-24 DIAGNOSIS — M6281 Muscle weakness (generalized): Secondary | ICD-10-CM

## 2020-02-24 DIAGNOSIS — R2689 Other abnormalities of gait and mobility: Secondary | ICD-10-CM
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
## 2020-02-26 ENCOUNTER — Other Ambulatory Visit: Payer: Self-pay

## 2020-02-26 ENCOUNTER — Ambulatory Visit: Payer: 59 | Attending: Internal Medicine | Admitting: Physical Therapy

## 2020-02-26 ENCOUNTER — Encounter: Payer: Self-pay | Admitting: Physical Therapy

## 2020-02-26 DIAGNOSIS — M546 Pain in thoracic spine: Secondary | ICD-10-CM

## 2020-02-26 DIAGNOSIS — M62838 Other muscle spasm: Secondary | ICD-10-CM | POA: Insufficient documentation

## 2020-02-26 DIAGNOSIS — M6281 Muscle weakness (generalized): Secondary | ICD-10-CM | POA: Insufficient documentation

## 2020-02-26 DIAGNOSIS — R262 Difficulty in walking, not elsewhere classified: Secondary | ICD-10-CM | POA: Diagnosis present

## 2020-02-26 DIAGNOSIS — R2689 Other abnormalities of gait and mobility: Secondary | ICD-10-CM | POA: Diagnosis present

## 2020-02-26 DIAGNOSIS — R29818 Other symptoms and signs involving the nervous system: Secondary | ICD-10-CM | POA: Diagnosis present
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
## 2020-03-03 ENCOUNTER — Ambulatory Visit: Payer: 59 | Admitting: Physical Therapy

## 2020-03-03 ENCOUNTER — Other Ambulatory Visit: Payer: Self-pay

## 2020-03-03 ENCOUNTER — Encounter: Payer: Self-pay | Admitting: Physical Therapy

## 2020-03-03 DIAGNOSIS — M6281 Muscle weakness (generalized): Secondary | ICD-10-CM | POA: Diagnosis not present

## 2020-03-03 DIAGNOSIS — R29818 Other symptoms and signs involving the nervous system: Secondary | ICD-10-CM

## 2020-03-03 DIAGNOSIS — R2689 Other abnormalities of gait and mobility: Secondary | ICD-10-CM

## 2020-03-03 DIAGNOSIS — M546 Pain in thoracic spine: Secondary | ICD-10-CM

## 2020-03-03 DIAGNOSIS — M62838 Other muscle spasm: Secondary | ICD-10-CM

## 2020-03-03 DIAGNOSIS — R262 Difficulty in walking, not elsewhere classified: Secondary | ICD-10-CM
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
## 2020-03-04 ENCOUNTER — Encounter: Payer: Self-pay | Admitting: Physical Therapy

## 2020-03-04 ENCOUNTER — Ambulatory Visit: Payer: 59 | Admitting: Physical Therapy

## 2020-03-04 DIAGNOSIS — M6281 Muscle weakness (generalized): Secondary | ICD-10-CM | POA: Diagnosis not present

## 2020-03-04 DIAGNOSIS — R29818 Other symptoms and signs involving the nervous system: Secondary | ICD-10-CM

## 2020-03-04 DIAGNOSIS — R2689 Other abnormalities of gait and mobility: Secondary | ICD-10-CM

## 2020-03-04 DIAGNOSIS — R262 Difficulty in walking, not elsewhere classified: Secondary | ICD-10-CM

## 2020-03-04 DIAGNOSIS — M62838 Other muscle spasm: Secondary | ICD-10-CM

## 2020-03-04 DIAGNOSIS — M546 Pain in thoracic spine: Secondary | ICD-10-CM
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
## 2020-03-09 ENCOUNTER — Ambulatory Visit: Payer: 59 | Admitting: Physical Therapy

## 2020-03-09 ENCOUNTER — Other Ambulatory Visit: Payer: Self-pay

## 2020-03-09 ENCOUNTER — Encounter: Payer: Self-pay | Admitting: Physical Therapy

## 2020-03-09 DIAGNOSIS — M546 Pain in thoracic spine: Secondary | ICD-10-CM

## 2020-03-09 DIAGNOSIS — M6281 Muscle weakness (generalized): Secondary | ICD-10-CM

## 2020-03-09 DIAGNOSIS — R262 Difficulty in walking, not elsewhere classified: Secondary | ICD-10-CM

## 2020-03-09 DIAGNOSIS — M62838 Other muscle spasm: Secondary | ICD-10-CM

## 2020-03-09 DIAGNOSIS — R2689 Other abnormalities of gait and mobility: Secondary | ICD-10-CM

## 2020-03-09 DIAGNOSIS — R29818 Other symptoms and signs involving the nervous system: Secondary | ICD-10-CM
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
## 2020-03-12 ENCOUNTER — Other Ambulatory Visit: Payer: Self-pay

## 2020-03-12 ENCOUNTER — Encounter: Payer: Self-pay | Admitting: Physical Therapy

## 2020-03-12 ENCOUNTER — Ambulatory Visit: Payer: 59 | Admitting: Physical Therapy

## 2020-03-12 DIAGNOSIS — M6281 Muscle weakness (generalized): Secondary | ICD-10-CM

## 2020-03-12 DIAGNOSIS — R262 Difficulty in walking, not elsewhere classified: Secondary | ICD-10-CM

## 2020-03-12 DIAGNOSIS — R2689 Other abnormalities of gait and mobility: Secondary | ICD-10-CM

## 2020-03-12 DIAGNOSIS — M62838 Other muscle spasm: Secondary | ICD-10-CM

## 2020-03-12 DIAGNOSIS — M546 Pain in thoracic spine: Secondary | ICD-10-CM

## 2020-03-12 DIAGNOSIS — R29818 Other symptoms and signs involving the nervous system: Secondary | ICD-10-CM
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
## 2020-03-12 NOTE — Addendum Note (Signed)
Addended by: Janene Harvey D on: 03/12/2020 11:24 AM   Modules accepted: Orders

## 2020-03-16 ENCOUNTER — Ambulatory Visit: Payer: 59

## 2020-03-16 ENCOUNTER — Other Ambulatory Visit: Payer: Self-pay

## 2020-03-16 DIAGNOSIS — M62838 Other muscle spasm: Secondary | ICD-10-CM

## 2020-03-16 DIAGNOSIS — M546 Pain in thoracic spine: Secondary | ICD-10-CM

## 2020-03-16 DIAGNOSIS — M6281 Muscle weakness (generalized): Secondary | ICD-10-CM | POA: Diagnosis not present

## 2020-03-16 DIAGNOSIS — R2689 Other abnormalities of gait and mobility: Secondary | ICD-10-CM

## 2020-03-16 DIAGNOSIS — R29818 Other symptoms and signs involving the nervous system: Secondary | ICD-10-CM

## 2020-03-16 DIAGNOSIS — R262 Difficulty in walking, not elsewhere classified: Secondary | ICD-10-CM
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
## 2020-03-19 ENCOUNTER — Encounter: Payer: Self-pay | Admitting: Physical Therapy

## 2020-03-19 ENCOUNTER — Other Ambulatory Visit: Payer: Self-pay

## 2020-03-19 ENCOUNTER — Ambulatory Visit: Payer: 59 | Admitting: Physical Therapy

## 2020-03-19 DIAGNOSIS — R262 Difficulty in walking, not elsewhere classified: Secondary | ICD-10-CM

## 2020-03-19 DIAGNOSIS — M6281 Muscle weakness (generalized): Secondary | ICD-10-CM | POA: Diagnosis not present

## 2020-03-19 DIAGNOSIS — R2689 Other abnormalities of gait and mobility: Secondary | ICD-10-CM

## 2020-03-19 DIAGNOSIS — R29818 Other symptoms and signs involving the nervous system: Secondary | ICD-10-CM

## 2020-03-19 DIAGNOSIS — M62838 Other muscle spasm: Secondary | ICD-10-CM

## 2020-03-19 DIAGNOSIS — M546 Pain in thoracic spine: Secondary | ICD-10-CM
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
## 2020-03-23 ENCOUNTER — Ambulatory Visit: Payer: 59

## 2020-03-23 ENCOUNTER — Other Ambulatory Visit: Payer: Self-pay

## 2020-03-23 DIAGNOSIS — M6281 Muscle weakness (generalized): Secondary | ICD-10-CM

## 2020-03-23 DIAGNOSIS — R262 Difficulty in walking, not elsewhere classified: Secondary | ICD-10-CM

## 2020-03-23 DIAGNOSIS — R2689 Other abnormalities of gait and mobility: Secondary | ICD-10-CM

## 2020-03-23 DIAGNOSIS — M546 Pain in thoracic spine: Secondary | ICD-10-CM

## 2020-03-23 DIAGNOSIS — M62838 Other muscle spasm: Secondary | ICD-10-CM

## 2020-03-23 DIAGNOSIS — R29818 Other symptoms and signs involving the nervous system: Secondary | ICD-10-CM
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
## 2020-03-26 ENCOUNTER — Ambulatory Visit: Payer: 59

## 2020-03-26 ENCOUNTER — Other Ambulatory Visit: Payer: Self-pay

## 2020-03-26 DIAGNOSIS — M6281 Muscle weakness (generalized): Secondary | ICD-10-CM | POA: Diagnosis not present

## 2020-03-26 DIAGNOSIS — R29818 Other symptoms and signs involving the nervous system: Secondary | ICD-10-CM

## 2020-03-26 DIAGNOSIS — M546 Pain in thoracic spine: Secondary | ICD-10-CM

## 2020-03-26 DIAGNOSIS — R2689 Other abnormalities of gait and mobility: Secondary | ICD-10-CM

## 2020-03-26 DIAGNOSIS — R262 Difficulty in walking, not elsewhere classified: Secondary | ICD-10-CM

## 2020-03-26 DIAGNOSIS — M62838 Other muscle spasm: Secondary | ICD-10-CM
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
## 2020-03-29 ENCOUNTER — Telehealth: Payer: Self-pay

## 2020-03-29 NOTE — Telephone Encounter (Signed)
New message    The patient checking on the status of my chart messages from  7.29.21

## 2020-03-30 ENCOUNTER — Other Ambulatory Visit: Payer: Self-pay | Admitting: Internal Medicine

## 2020-03-30 ENCOUNTER — Other Ambulatory Visit: Payer: Self-pay

## 2020-03-30 ENCOUNTER — Ambulatory Visit: Payer: 59 | Attending: Internal Medicine

## 2020-03-30 ENCOUNTER — Ambulatory Visit (INDEPENDENT_AMBULATORY_CARE_PROVIDER_SITE_OTHER)
Admission: RE | Admit: 2020-03-30 | Discharge: 2020-03-30 | Disposition: A | Discharge status: Home / Self Care | Payer: 59 | Source: Ambulatory Visit | Attending: Internal Medicine | Admitting: Internal Medicine

## 2020-03-30 DIAGNOSIS — M5104 Intervertebral disc disorders with myelopathy, thoracic region: Secondary | ICD-10-CM | POA: Diagnosis not present

## 2020-03-30 DIAGNOSIS — M6281 Muscle weakness (generalized): Secondary | ICD-10-CM

## 2020-03-30 DIAGNOSIS — M546 Pain in thoracic spine: Secondary | ICD-10-CM | POA: Insufficient documentation

## 2020-03-30 DIAGNOSIS — R2689 Other abnormalities of gait and mobility: Secondary | ICD-10-CM | POA: Diagnosis present

## 2020-03-30 DIAGNOSIS — R262 Difficulty in walking, not elsewhere classified: Secondary | ICD-10-CM | POA: Diagnosis present

## 2020-03-30 DIAGNOSIS — R29818 Other symptoms and signs involving the nervous system: Secondary | ICD-10-CM | POA: Diagnosis present

## 2020-03-30 DIAGNOSIS — M62838 Other muscle spasm: Secondary | ICD-10-CM
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
## 2020-03-31 NOTE — Telephone Encounter (Signed)
Unable to reach pt and unable to leave vm 

## 2020-03-31 NOTE — Telephone Encounter (Signed)
Tried calling pt again, no answer and unable to leave vm

## 2020-04-02 ENCOUNTER — Encounter: Payer: Self-pay | Admitting: Physical Therapy

## 2020-04-02 ENCOUNTER — Ambulatory Visit: Payer: 59 | Admitting: Physical Therapy

## 2020-04-02 ENCOUNTER — Other Ambulatory Visit: Payer: Self-pay

## 2020-04-02 DIAGNOSIS — M62838 Other muscle spasm: Secondary | ICD-10-CM

## 2020-04-02 DIAGNOSIS — M546 Pain in thoracic spine: Secondary | ICD-10-CM

## 2020-04-02 DIAGNOSIS — M6281 Muscle weakness (generalized): Secondary | ICD-10-CM | POA: Diagnosis not present

## 2020-04-02 DIAGNOSIS — R29818 Other symptoms and signs involving the nervous system: Secondary | ICD-10-CM

## 2020-04-02 DIAGNOSIS — R262 Difficulty in walking, not elsewhere classified: Secondary | ICD-10-CM

## 2020-04-02 DIAGNOSIS — R2689 Other abnormalities of gait and mobility: Secondary | ICD-10-CM

## 2020-04-02 NOTE — Telephone Encounter (Signed)
Pt has been contacted back by Dr.Plotnikov via mychart
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
## 2020-04-06 ENCOUNTER — Ambulatory Visit: Payer: 59

## 2020-04-07 ENCOUNTER — Ambulatory Visit: Payer: 59

## 2020-04-07 ENCOUNTER — Other Ambulatory Visit: Payer: Self-pay

## 2020-04-07 DIAGNOSIS — R29818 Other symptoms and signs involving the nervous system: Secondary | ICD-10-CM

## 2020-04-07 DIAGNOSIS — M62838 Other muscle spasm: Secondary | ICD-10-CM

## 2020-04-07 DIAGNOSIS — R2689 Other abnormalities of gait and mobility: Secondary | ICD-10-CM

## 2020-04-07 DIAGNOSIS — R262 Difficulty in walking, not elsewhere classified: Secondary | ICD-10-CM

## 2020-04-07 DIAGNOSIS — M6281 Muscle weakness (generalized): Secondary | ICD-10-CM

## 2020-04-07 DIAGNOSIS — M546 Pain in thoracic spine: Secondary | ICD-10-CM
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
## 2020-04-09 ENCOUNTER — Ambulatory Visit: Payer: 59 | Admitting: Physical Therapy

## 2020-04-12 ENCOUNTER — Other Ambulatory Visit: Payer: Self-pay

## 2020-04-12 ENCOUNTER — Encounter: Payer: Self-pay | Admitting: Physical Therapy

## 2020-04-12 ENCOUNTER — Ambulatory Visit: Payer: 59 | Admitting: Physical Therapy

## 2020-04-12 DIAGNOSIS — M546 Pain in thoracic spine: Secondary | ICD-10-CM

## 2020-04-12 DIAGNOSIS — R2689 Other abnormalities of gait and mobility: Secondary | ICD-10-CM

## 2020-04-12 DIAGNOSIS — R29818 Other symptoms and signs involving the nervous system: Secondary | ICD-10-CM

## 2020-04-12 DIAGNOSIS — M62838 Other muscle spasm: Secondary | ICD-10-CM

## 2020-04-12 DIAGNOSIS — M6281 Muscle weakness (generalized): Secondary | ICD-10-CM | POA: Diagnosis not present

## 2020-04-12 DIAGNOSIS — R262 Difficulty in walking, not elsewhere classified: Secondary | ICD-10-CM
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
## 2020-04-16 ENCOUNTER — Other Ambulatory Visit: Payer: Self-pay

## 2020-04-16 ENCOUNTER — Encounter: Payer: Self-pay | Admitting: Physical Therapy

## 2020-04-16 ENCOUNTER — Ambulatory Visit: Payer: 59 | Admitting: Physical Therapy

## 2020-04-16 DIAGNOSIS — M546 Pain in thoracic spine: Secondary | ICD-10-CM

## 2020-04-16 DIAGNOSIS — R262 Difficulty in walking, not elsewhere classified: Secondary | ICD-10-CM

## 2020-04-16 DIAGNOSIS — M62838 Other muscle spasm: Secondary | ICD-10-CM

## 2020-04-16 DIAGNOSIS — M6281 Muscle weakness (generalized): Secondary | ICD-10-CM

## 2020-04-16 DIAGNOSIS — R29818 Other symptoms and signs involving the nervous system: Secondary | ICD-10-CM

## 2020-04-16 DIAGNOSIS — R2689 Other abnormalities of gait and mobility: Secondary | ICD-10-CM
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
## 2020-04-20 ENCOUNTER — Ambulatory Visit: Payer: 59 | Admitting: Physical Therapy

## 2020-04-20 ENCOUNTER — Other Ambulatory Visit: Payer: Self-pay

## 2020-04-20 ENCOUNTER — Encounter: Payer: Self-pay | Admitting: Physical Therapy

## 2020-04-20 DIAGNOSIS — M6281 Muscle weakness (generalized): Secondary | ICD-10-CM | POA: Diagnosis not present

## 2020-04-20 DIAGNOSIS — M62838 Other muscle spasm: Secondary | ICD-10-CM

## 2020-04-20 DIAGNOSIS — R262 Difficulty in walking, not elsewhere classified: Secondary | ICD-10-CM

## 2020-04-20 DIAGNOSIS — R2689 Other abnormalities of gait and mobility: Secondary | ICD-10-CM

## 2020-04-20 DIAGNOSIS — M546 Pain in thoracic spine: Secondary | ICD-10-CM

## 2020-04-20 DIAGNOSIS — R29818 Other symptoms and signs involving the nervous system: Secondary | ICD-10-CM
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
## 2020-04-23 ENCOUNTER — Other Ambulatory Visit: Payer: Self-pay | Admitting: Internal Medicine

## 2020-04-27 ENCOUNTER — Other Ambulatory Visit: Payer: Self-pay

## 2020-04-27 ENCOUNTER — Ambulatory Visit: Payer: 59

## 2020-04-27 DIAGNOSIS — R29818 Other symptoms and signs involving the nervous system: Secondary | ICD-10-CM

## 2020-04-27 DIAGNOSIS — R262 Difficulty in walking, not elsewhere classified: Secondary | ICD-10-CM

## 2020-04-27 DIAGNOSIS — R2689 Other abnormalities of gait and mobility: Secondary | ICD-10-CM

## 2020-04-27 DIAGNOSIS — M546 Pain in thoracic spine: Secondary | ICD-10-CM

## 2020-04-27 DIAGNOSIS — M6281 Muscle weakness (generalized): Secondary | ICD-10-CM

## 2020-04-27 DIAGNOSIS — M62838 Other muscle spasm: Secondary | ICD-10-CM
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
## 2020-04-28 ENCOUNTER — Other Ambulatory Visit: Payer: Self-pay | Admitting: Internal Medicine

## 2020-04-28 DIAGNOSIS — M5104 Intervertebral disc disorders with myelopathy, thoracic region: Secondary | ICD-10-CM

## 2020-04-30 ENCOUNTER — Other Ambulatory Visit: Payer: Self-pay

## 2020-04-30 ENCOUNTER — Ambulatory Visit: Payer: 59 | Attending: Internal Medicine

## 2020-04-30 DIAGNOSIS — R2689 Other abnormalities of gait and mobility: Secondary | ICD-10-CM | POA: Diagnosis present

## 2020-04-30 DIAGNOSIS — R262 Difficulty in walking, not elsewhere classified: Secondary | ICD-10-CM | POA: Insufficient documentation

## 2020-04-30 DIAGNOSIS — M62838 Other muscle spasm: Secondary | ICD-10-CM | POA: Diagnosis present

## 2020-04-30 DIAGNOSIS — M6281 Muscle weakness (generalized): Secondary | ICD-10-CM | POA: Diagnosis present

## 2020-04-30 DIAGNOSIS — R29818 Other symptoms and signs involving the nervous system: Secondary | ICD-10-CM | POA: Diagnosis present

## 2020-04-30 DIAGNOSIS — M546 Pain in thoracic spine: Secondary | ICD-10-CM | POA: Diagnosis present
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
## 2020-05-04 ENCOUNTER — Other Ambulatory Visit: Payer: Self-pay

## 2020-05-04 ENCOUNTER — Ambulatory Visit: Payer: 59

## 2020-05-04 DIAGNOSIS — R29818 Other symptoms and signs involving the nervous system: Secondary | ICD-10-CM

## 2020-05-04 DIAGNOSIS — R262 Difficulty in walking, not elsewhere classified: Secondary | ICD-10-CM

## 2020-05-04 DIAGNOSIS — R2689 Other abnormalities of gait and mobility: Secondary | ICD-10-CM

## 2020-05-04 DIAGNOSIS — M6281 Muscle weakness (generalized): Secondary | ICD-10-CM

## 2020-05-04 DIAGNOSIS — M62838 Other muscle spasm: Secondary | ICD-10-CM

## 2020-05-04 DIAGNOSIS — M546 Pain in thoracic spine: Secondary | ICD-10-CM
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
## 2020-05-07 ENCOUNTER — Other Ambulatory Visit: Payer: Self-pay

## 2020-05-07 ENCOUNTER — Ambulatory Visit: Payer: 59 | Admitting: Physical Therapy

## 2020-05-07 ENCOUNTER — Encounter: Payer: Self-pay | Admitting: Physical Therapy

## 2020-05-07 DIAGNOSIS — M62838 Other muscle spasm: Secondary | ICD-10-CM

## 2020-05-07 DIAGNOSIS — R2689 Other abnormalities of gait and mobility: Secondary | ICD-10-CM

## 2020-05-07 DIAGNOSIS — R29818 Other symptoms and signs involving the nervous system: Secondary | ICD-10-CM

## 2020-05-07 DIAGNOSIS — M6281 Muscle weakness (generalized): Secondary | ICD-10-CM

## 2020-05-07 DIAGNOSIS — M546 Pain in thoracic spine: Secondary | ICD-10-CM

## 2020-05-07 DIAGNOSIS — R262 Difficulty in walking, not elsewhere classified: Secondary | ICD-10-CM
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
## 2020-05-11 ENCOUNTER — Ambulatory Visit: Payer: 59

## 2020-05-11 ENCOUNTER — Other Ambulatory Visit: Payer: Self-pay

## 2020-05-11 DIAGNOSIS — R29818 Other symptoms and signs involving the nervous system: Secondary | ICD-10-CM

## 2020-05-11 DIAGNOSIS — M6281 Muscle weakness (generalized): Secondary | ICD-10-CM | POA: Diagnosis not present

## 2020-05-11 DIAGNOSIS — R2689 Other abnormalities of gait and mobility: Secondary | ICD-10-CM

## 2020-05-11 DIAGNOSIS — M62838 Other muscle spasm: Secondary | ICD-10-CM

## 2020-05-11 DIAGNOSIS — M546 Pain in thoracic spine: Secondary | ICD-10-CM

## 2020-05-11 DIAGNOSIS — R262 Difficulty in walking, not elsewhere classified: Secondary | ICD-10-CM
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
## 2020-05-14 ENCOUNTER — Ambulatory Visit: Payer: 59 | Admitting: Physical Therapy

## 2020-05-14 ENCOUNTER — Other Ambulatory Visit: Payer: Self-pay

## 2020-05-14 ENCOUNTER — Encounter: Payer: Self-pay | Admitting: Physical Therapy

## 2020-05-14 DIAGNOSIS — R2689 Other abnormalities of gait and mobility: Secondary | ICD-10-CM

## 2020-05-14 DIAGNOSIS — M6281 Muscle weakness (generalized): Secondary | ICD-10-CM

## 2020-05-14 DIAGNOSIS — R262 Difficulty in walking, not elsewhere classified: Secondary | ICD-10-CM

## 2020-05-14 DIAGNOSIS — M62838 Other muscle spasm: Secondary | ICD-10-CM

## 2020-05-14 DIAGNOSIS — R29818 Other symptoms and signs involving the nervous system: Secondary | ICD-10-CM

## 2020-05-14 DIAGNOSIS — M546 Pain in thoracic spine: Secondary | ICD-10-CM
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
## 2020-05-18 ENCOUNTER — Ambulatory Visit: Payer: 59 | Admitting: Physical Therapy

## 2020-05-18 ENCOUNTER — Other Ambulatory Visit: Payer: Self-pay

## 2020-05-18 ENCOUNTER — Encounter: Payer: Self-pay | Admitting: Physical Therapy

## 2020-05-18 DIAGNOSIS — M546 Pain in thoracic spine: Secondary | ICD-10-CM

## 2020-05-18 DIAGNOSIS — R262 Difficulty in walking, not elsewhere classified: Secondary | ICD-10-CM

## 2020-05-18 DIAGNOSIS — M6281 Muscle weakness (generalized): Secondary | ICD-10-CM

## 2020-05-18 DIAGNOSIS — R29818 Other symptoms and signs involving the nervous system: Secondary | ICD-10-CM

## 2020-05-18 DIAGNOSIS — M62838 Other muscle spasm: Secondary | ICD-10-CM

## 2020-05-18 DIAGNOSIS — R2689 Other abnormalities of gait and mobility: Secondary | ICD-10-CM
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
## 2020-05-21 ENCOUNTER — Other Ambulatory Visit: Payer: Self-pay

## 2020-05-21 ENCOUNTER — Encounter: Payer: Self-pay | Admitting: Physical Therapy

## 2020-05-21 ENCOUNTER — Ambulatory Visit: Payer: 59 | Admitting: Physical Therapy

## 2020-05-21 DIAGNOSIS — M6281 Muscle weakness (generalized): Secondary | ICD-10-CM | POA: Diagnosis not present

## 2020-05-21 DIAGNOSIS — M62838 Other muscle spasm: Secondary | ICD-10-CM

## 2020-05-21 DIAGNOSIS — R262 Difficulty in walking, not elsewhere classified: Secondary | ICD-10-CM

## 2020-05-21 DIAGNOSIS — R29818 Other symptoms and signs involving the nervous system: Secondary | ICD-10-CM

## 2020-05-21 DIAGNOSIS — R2689 Other abnormalities of gait and mobility: Secondary | ICD-10-CM

## 2020-05-21 DIAGNOSIS — M546 Pain in thoracic spine: Secondary | ICD-10-CM
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
## 2020-05-25 ENCOUNTER — Other Ambulatory Visit: Payer: Self-pay

## 2020-05-25 ENCOUNTER — Ambulatory Visit: Payer: 59

## 2020-05-25 DIAGNOSIS — R262 Difficulty in walking, not elsewhere classified: Secondary | ICD-10-CM

## 2020-05-25 DIAGNOSIS — M62838 Other muscle spasm: Secondary | ICD-10-CM

## 2020-05-25 DIAGNOSIS — R29818 Other symptoms and signs involving the nervous system: Secondary | ICD-10-CM

## 2020-05-25 DIAGNOSIS — M6281 Muscle weakness (generalized): Secondary | ICD-10-CM | POA: Diagnosis not present

## 2020-05-25 DIAGNOSIS — R2689 Other abnormalities of gait and mobility: Secondary | ICD-10-CM

## 2020-05-25 DIAGNOSIS — M546 Pain in thoracic spine: Secondary | ICD-10-CM
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
## 2020-05-28 ENCOUNTER — Other Ambulatory Visit: Payer: Self-pay

## 2020-05-28 ENCOUNTER — Ambulatory Visit: Payer: 59 | Attending: Internal Medicine | Admitting: Physical Therapy

## 2020-05-28 ENCOUNTER — Encounter: Payer: Self-pay | Admitting: Physical Therapy

## 2020-05-28 DIAGNOSIS — R29818 Other symptoms and signs involving the nervous system: Secondary | ICD-10-CM

## 2020-05-28 DIAGNOSIS — R2689 Other abnormalities of gait and mobility: Secondary | ICD-10-CM | POA: Diagnosis present

## 2020-05-28 DIAGNOSIS — R262 Difficulty in walking, not elsewhere classified: Secondary | ICD-10-CM | POA: Insufficient documentation

## 2020-05-28 DIAGNOSIS — M546 Pain in thoracic spine: Secondary | ICD-10-CM

## 2020-05-28 DIAGNOSIS — M6281 Muscle weakness (generalized): Secondary | ICD-10-CM

## 2020-05-28 DIAGNOSIS — M62838 Other muscle spasm: Secondary | ICD-10-CM | POA: Diagnosis present
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
## 2020-06-01 ENCOUNTER — Ambulatory Visit: Payer: 59

## 2020-06-01 ENCOUNTER — Other Ambulatory Visit: Payer: Self-pay

## 2020-06-01 DIAGNOSIS — M6281 Muscle weakness (generalized): Secondary | ICD-10-CM | POA: Diagnosis not present

## 2020-06-01 DIAGNOSIS — M546 Pain in thoracic spine: Secondary | ICD-10-CM

## 2020-06-01 DIAGNOSIS — R262 Difficulty in walking, not elsewhere classified: Secondary | ICD-10-CM

## 2020-06-01 DIAGNOSIS — R29818 Other symptoms and signs involving the nervous system: Secondary | ICD-10-CM

## 2020-06-01 DIAGNOSIS — M62838 Other muscle spasm: Secondary | ICD-10-CM

## 2020-06-01 DIAGNOSIS — R2689 Other abnormalities of gait and mobility: Secondary | ICD-10-CM
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
## 2020-06-04 ENCOUNTER — Other Ambulatory Visit: Payer: Self-pay

## 2020-06-04 ENCOUNTER — Ambulatory Visit: Payer: 59 | Attending: Internal Medicine

## 2020-06-04 ENCOUNTER — Other Ambulatory Visit (HOSPITAL_BASED_OUTPATIENT_CLINIC_OR_DEPARTMENT_OTHER): Payer: Self-pay | Admitting: Internal Medicine

## 2020-06-04 ENCOUNTER — Ambulatory Visit: Payer: 59

## 2020-06-04 DIAGNOSIS — R29818 Other symptoms and signs involving the nervous system: Secondary | ICD-10-CM

## 2020-06-04 DIAGNOSIS — M6281 Muscle weakness (generalized): Secondary | ICD-10-CM | POA: Diagnosis not present

## 2020-06-04 DIAGNOSIS — R2689 Other abnormalities of gait and mobility: Secondary | ICD-10-CM

## 2020-06-04 DIAGNOSIS — R262 Difficulty in walking, not elsewhere classified: Secondary | ICD-10-CM

## 2020-06-04 DIAGNOSIS — Z23 Encounter for immunization: Secondary | ICD-10-CM

## 2020-06-04 DIAGNOSIS — M546 Pain in thoracic spine: Secondary | ICD-10-CM

## 2020-06-04 DIAGNOSIS — M62838 Other muscle spasm: Secondary | ICD-10-CM
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
## 2020-06-04 NOTE — Progress Notes (Signed)
   Covid-19 Vaccination Clinic  Name:  [name]    MRN: [id number] DOB: 1966-03-23  06/04/2020  Mr. [name] was observed post Covid-19 immunization for 15 minutes without incident. He was provided with Vaccine Information Sheet and instruction to access the V-Safe system. Vaccinated by [facility].  Mr. [name] was instructed to call 911 with any severe reactions post vaccine: Marland Kitchen Difficulty breathing  . Swelling of face and throat  . A fast heartbeat  . A bad rash all over body  . Dizziness and weakness   Immunizations Administered    Name Date Dose VIS Date Route   Pfizer COVID-19 Vaccine 06/04/2020 10:22 AM 0.3 mL 10/22/2018 Intramuscular   Manufacturer: [place]   Lot: [id number]   [place]: [address]-4

## 2020-06-08 ENCOUNTER — Other Ambulatory Visit: Payer: Self-pay

## 2020-06-08 ENCOUNTER — Ambulatory Visit: Payer: 59

## 2020-06-08 DIAGNOSIS — R2689 Other abnormalities of gait and mobility: Secondary | ICD-10-CM

## 2020-06-08 DIAGNOSIS — M546 Pain in thoracic spine: Secondary | ICD-10-CM

## 2020-06-08 DIAGNOSIS — M6281 Muscle weakness (generalized): Secondary | ICD-10-CM

## 2020-06-08 DIAGNOSIS — R262 Difficulty in walking, not elsewhere classified: Secondary | ICD-10-CM

## 2020-06-08 DIAGNOSIS — M62838 Other muscle spasm: Secondary | ICD-10-CM

## 2020-06-08 DIAGNOSIS — R29818 Other symptoms and signs involving the nervous system: Secondary | ICD-10-CM
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
## 2020-06-11 ENCOUNTER — Other Ambulatory Visit: Payer: Self-pay

## 2020-06-11 ENCOUNTER — Encounter: Payer: Self-pay | Admitting: Physical Therapy

## 2020-06-11 ENCOUNTER — Ambulatory Visit: Payer: 59 | Admitting: Physical Therapy

## 2020-06-11 DIAGNOSIS — R29818 Other symptoms and signs involving the nervous system: Secondary | ICD-10-CM

## 2020-06-11 DIAGNOSIS — R262 Difficulty in walking, not elsewhere classified: Secondary | ICD-10-CM

## 2020-06-11 DIAGNOSIS — R2689 Other abnormalities of gait and mobility: Secondary | ICD-10-CM

## 2020-06-11 DIAGNOSIS — M6281 Muscle weakness (generalized): Secondary | ICD-10-CM

## 2020-06-11 DIAGNOSIS — M546 Pain in thoracic spine: Secondary | ICD-10-CM

## 2020-06-11 DIAGNOSIS — M62838 Other muscle spasm: Secondary | ICD-10-CM

## 2020-06-11 MED FILL — PFIZER-BIONTECH COVID-19 VA: 30 | 1 days supply | Qty: 0 | Fill #0
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
## 2020-06-15 ENCOUNTER — Ambulatory Visit: Payer: 59

## 2020-06-18 ENCOUNTER — Ambulatory Visit: Payer: 59 | Admitting: Physical Therapy

## 2020-06-18 ENCOUNTER — Encounter: Payer: Self-pay | Admitting: Physical Therapy

## 2020-06-18 ENCOUNTER — Other Ambulatory Visit: Payer: Self-pay

## 2020-06-18 DIAGNOSIS — R29818 Other symptoms and signs involving the nervous system: Secondary | ICD-10-CM

## 2020-06-18 DIAGNOSIS — M62838 Other muscle spasm: Secondary | ICD-10-CM

## 2020-06-18 DIAGNOSIS — R262 Difficulty in walking, not elsewhere classified: Secondary | ICD-10-CM

## 2020-06-18 DIAGNOSIS — R2689 Other abnormalities of gait and mobility: Secondary | ICD-10-CM

## 2020-06-18 DIAGNOSIS — M6281 Muscle weakness (generalized): Secondary | ICD-10-CM | POA: Diagnosis not present

## 2020-06-18 DIAGNOSIS — M546 Pain in thoracic spine: Secondary | ICD-10-CM
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
## 2020-06-22 ENCOUNTER — Other Ambulatory Visit: Payer: Self-pay

## 2020-06-22 ENCOUNTER — Ambulatory Visit: Payer: 59

## 2020-06-22 DIAGNOSIS — M546 Pain in thoracic spine: Secondary | ICD-10-CM

## 2020-06-22 DIAGNOSIS — R2689 Other abnormalities of gait and mobility: Secondary | ICD-10-CM

## 2020-06-22 DIAGNOSIS — R29818 Other symptoms and signs involving the nervous system: Secondary | ICD-10-CM

## 2020-06-22 DIAGNOSIS — M6281 Muscle weakness (generalized): Secondary | ICD-10-CM

## 2020-06-22 DIAGNOSIS — R262 Difficulty in walking, not elsewhere classified: Secondary | ICD-10-CM

## 2020-06-22 DIAGNOSIS — M62838 Other muscle spasm: Secondary | ICD-10-CM
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
## 2020-06-24 ENCOUNTER — Other Ambulatory Visit: Payer: Self-pay

## 2020-06-24 ENCOUNTER — Ambulatory Visit: Payer: 59 | Admitting: Physical Therapy

## 2020-06-24 ENCOUNTER — Encounter: Payer: Self-pay | Admitting: Physical Therapy

## 2020-06-24 DIAGNOSIS — M62838 Other muscle spasm: Secondary | ICD-10-CM

## 2020-06-24 DIAGNOSIS — M6281 Muscle weakness (generalized): Secondary | ICD-10-CM | POA: Diagnosis not present

## 2020-06-24 DIAGNOSIS — R2689 Other abnormalities of gait and mobility: Secondary | ICD-10-CM

## 2020-06-24 DIAGNOSIS — R262 Difficulty in walking, not elsewhere classified: Secondary | ICD-10-CM

## 2020-06-24 DIAGNOSIS — M546 Pain in thoracic spine: Secondary | ICD-10-CM

## 2020-06-24 DIAGNOSIS — R29818 Other symptoms and signs involving the nervous system: Secondary | ICD-10-CM
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
## 2020-06-25 ENCOUNTER — Encounter: Payer: 59 | Admitting: Physical Therapy

## 2020-06-29 ENCOUNTER — Ambulatory Visit: Payer: 59 | Attending: Internal Medicine

## 2020-06-29 ENCOUNTER — Other Ambulatory Visit: Payer: Self-pay

## 2020-06-29 ENCOUNTER — Other Ambulatory Visit: Payer: Self-pay | Admitting: Internal Medicine

## 2020-06-29 DIAGNOSIS — Z23 Encounter for immunization: Secondary | ICD-10-CM

## 2020-06-29 DIAGNOSIS — M546 Pain in thoracic spine: Secondary | ICD-10-CM

## 2020-06-29 DIAGNOSIS — R2689 Other abnormalities of gait and mobility: Secondary | ICD-10-CM | POA: Insufficient documentation

## 2020-06-29 DIAGNOSIS — M6281 Muscle weakness (generalized): Secondary | ICD-10-CM

## 2020-06-29 DIAGNOSIS — M62838 Other muscle spasm: Secondary | ICD-10-CM | POA: Diagnosis present

## 2020-06-29 DIAGNOSIS — R29818 Other symptoms and signs involving the nervous system: Secondary | ICD-10-CM | POA: Insufficient documentation

## 2020-06-29 DIAGNOSIS — R262 Difficulty in walking, not elsewhere classified: Secondary | ICD-10-CM | POA: Diagnosis present

## 2020-06-29 DIAGNOSIS — M5104 Intervertebral disc disorders with myelopathy, thoracic region: Secondary | ICD-10-CM
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
## 2020-06-29 NOTE — Progress Notes (Signed)
   Covid-19 Vaccination Clinic  Name:  [name]    MRN: [id number] DOB: 1965/11/08  06/29/2020  [name] was observed post Covid-19 immunization for 15 minutes without incident. He was provided with Vaccine Information Sheet and instruction to access the V-Safe system.   [name] was instructed to call 911 with any severe reactions post vaccine: Marland Kitchen Difficulty breathing  . Swelling of face and throat  . A fast heartbeat  . A bad rash all over body  . Dizziness and weakness   Immunizations Administered    Name Date Dose VIS Date Route   Pfizer COVID-19 Vaccine 06/29/2020  9:49 AM 0.3 mL 06/16/2020 Intramuscular   Manufacturer: [place]   Lot: [id number]   [place]: [id number]

## 2020-07-02 ENCOUNTER — Ambulatory Visit: Payer: 59 | Admitting: Physical Therapy

## 2020-07-02 ENCOUNTER — Other Ambulatory Visit: Payer: Self-pay

## 2020-07-02 ENCOUNTER — Ambulatory Visit (INDEPENDENT_AMBULATORY_CARE_PROVIDER_SITE_OTHER)
Admission: RE | Admit: 2020-07-02 | Discharge: 2020-07-02 | Disposition: A | Discharge status: Home / Self Care | Payer: 59 | Source: Ambulatory Visit | Attending: Internal Medicine | Admitting: Internal Medicine

## 2020-07-02 ENCOUNTER — Encounter: Payer: Self-pay | Admitting: Physical Therapy

## 2020-07-02 DIAGNOSIS — M6281 Muscle weakness (generalized): Secondary | ICD-10-CM

## 2020-07-02 DIAGNOSIS — R262 Difficulty in walking, not elsewhere classified: Secondary | ICD-10-CM

## 2020-07-02 DIAGNOSIS — M5104 Intervertebral disc disorders with myelopathy, thoracic region: Secondary | ICD-10-CM | POA: Diagnosis not present

## 2020-07-02 DIAGNOSIS — M546 Pain in thoracic spine: Secondary | ICD-10-CM

## 2020-07-02 DIAGNOSIS — R2689 Other abnormalities of gait and mobility: Secondary | ICD-10-CM

## 2020-07-02 DIAGNOSIS — M62838 Other muscle spasm: Secondary | ICD-10-CM

## 2020-07-02 DIAGNOSIS — R29818 Other symptoms and signs involving the nervous system: Secondary | ICD-10-CM
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
## 2020-07-06 ENCOUNTER — Ambulatory Visit: Payer: 59

## 2020-07-06 ENCOUNTER — Other Ambulatory Visit: Payer: Self-pay

## 2020-07-06 DIAGNOSIS — R262 Difficulty in walking, not elsewhere classified: Secondary | ICD-10-CM

## 2020-07-06 DIAGNOSIS — M62838 Other muscle spasm: Secondary | ICD-10-CM

## 2020-07-06 DIAGNOSIS — R2689 Other abnormalities of gait and mobility: Secondary | ICD-10-CM

## 2020-07-06 DIAGNOSIS — M6281 Muscle weakness (generalized): Secondary | ICD-10-CM

## 2020-07-06 DIAGNOSIS — R29818 Other symptoms and signs involving the nervous system: Secondary | ICD-10-CM

## 2020-07-06 DIAGNOSIS — M546 Pain in thoracic spine: Secondary | ICD-10-CM
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
## 2020-07-08 ENCOUNTER — Other Ambulatory Visit (HOSPITAL_BASED_OUTPATIENT_CLINIC_OR_DEPARTMENT_OTHER): Payer: Self-pay | Admitting: Internal Medicine

## 2020-07-08 MED FILL — PFIZER-BIONTECH COVID-19 VA: 30 | 1 days supply | Qty: 0 | Fill #0

## 2020-07-09 ENCOUNTER — Encounter: Payer: 59 | Admitting: Physical Therapy

## 2020-07-13 ENCOUNTER — Other Ambulatory Visit: Payer: Self-pay

## 2020-07-13 ENCOUNTER — Ambulatory Visit: Payer: 59

## 2020-07-13 DIAGNOSIS — R2689 Other abnormalities of gait and mobility: Secondary | ICD-10-CM

## 2020-07-13 DIAGNOSIS — R29818 Other symptoms and signs involving the nervous system: Secondary | ICD-10-CM

## 2020-07-13 DIAGNOSIS — M546 Pain in thoracic spine: Secondary | ICD-10-CM

## 2020-07-13 DIAGNOSIS — M62838 Other muscle spasm: Secondary | ICD-10-CM

## 2020-07-13 DIAGNOSIS — R262 Difficulty in walking, not elsewhere classified: Secondary | ICD-10-CM

## 2020-07-13 DIAGNOSIS — M6281 Muscle weakness (generalized): Secondary | ICD-10-CM
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
## 2020-07-16 ENCOUNTER — Encounter: Payer: 59 | Admitting: Physical Therapy

## 2020-07-20 ENCOUNTER — Ambulatory Visit: Payer: 59

## 2020-07-20 ENCOUNTER — Other Ambulatory Visit: Payer: Self-pay

## 2020-07-20 DIAGNOSIS — R29818 Other symptoms and signs involving the nervous system: Secondary | ICD-10-CM

## 2020-07-20 DIAGNOSIS — M62838 Other muscle spasm: Secondary | ICD-10-CM

## 2020-07-20 DIAGNOSIS — R2689 Other abnormalities of gait and mobility: Secondary | ICD-10-CM

## 2020-07-20 DIAGNOSIS — M546 Pain in thoracic spine: Secondary | ICD-10-CM

## 2020-07-20 DIAGNOSIS — M6281 Muscle weakness (generalized): Secondary | ICD-10-CM

## 2020-07-20 DIAGNOSIS — R262 Difficulty in walking, not elsewhere classified: Secondary | ICD-10-CM
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
## 2020-07-21 ENCOUNTER — Ambulatory Visit: Payer: 59 | Admitting: Physical Therapy

## 2020-07-21 ENCOUNTER — Encounter: Payer: Self-pay | Admitting: Physical Therapy

## 2020-07-21 DIAGNOSIS — R2689 Other abnormalities of gait and mobility: Secondary | ICD-10-CM

## 2020-07-21 DIAGNOSIS — M546 Pain in thoracic spine: Secondary | ICD-10-CM

## 2020-07-21 DIAGNOSIS — R29818 Other symptoms and signs involving the nervous system: Secondary | ICD-10-CM

## 2020-07-21 DIAGNOSIS — M6281 Muscle weakness (generalized): Secondary | ICD-10-CM | POA: Diagnosis not present

## 2020-07-21 DIAGNOSIS — R262 Difficulty in walking, not elsewhere classified: Secondary | ICD-10-CM

## 2020-07-21 DIAGNOSIS — M62838 Other muscle spasm: Secondary | ICD-10-CM
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
## 2020-07-27 ENCOUNTER — Other Ambulatory Visit: Payer: Self-pay

## 2020-07-27 ENCOUNTER — Ambulatory Visit: Payer: 59

## 2020-07-27 DIAGNOSIS — M6281 Muscle weakness (generalized): Secondary | ICD-10-CM | POA: Diagnosis not present

## 2020-07-27 DIAGNOSIS — R2689 Other abnormalities of gait and mobility: Secondary | ICD-10-CM

## 2020-07-27 DIAGNOSIS — M62838 Other muscle spasm: Secondary | ICD-10-CM

## 2020-07-27 DIAGNOSIS — R262 Difficulty in walking, not elsewhere classified: Secondary | ICD-10-CM

## 2020-07-27 DIAGNOSIS — M546 Pain in thoracic spine: Secondary | ICD-10-CM

## 2020-07-27 DIAGNOSIS — R29818 Other symptoms and signs involving the nervous system: Secondary | ICD-10-CM
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
## 2020-07-29 ENCOUNTER — Ambulatory Visit: Payer: 59

## 2020-07-30 ENCOUNTER — Other Ambulatory Visit: Payer: Self-pay

## 2020-07-30 ENCOUNTER — Ambulatory Visit: Payer: 59 | Attending: Internal Medicine | Admitting: Physical Therapy

## 2020-07-30 ENCOUNTER — Encounter: Payer: Self-pay | Admitting: Physical Therapy

## 2020-07-30 DIAGNOSIS — M62838 Other muscle spasm: Secondary | ICD-10-CM | POA: Insufficient documentation

## 2020-07-30 DIAGNOSIS — M546 Pain in thoracic spine: Secondary | ICD-10-CM | POA: Insufficient documentation

## 2020-07-30 DIAGNOSIS — M6281 Muscle weakness (generalized): Secondary | ICD-10-CM

## 2020-07-30 DIAGNOSIS — R2689 Other abnormalities of gait and mobility: Secondary | ICD-10-CM

## 2020-07-30 DIAGNOSIS — R262 Difficulty in walking, not elsewhere classified: Secondary | ICD-10-CM | POA: Diagnosis present

## 2020-07-30 DIAGNOSIS — R29818 Other symptoms and signs involving the nervous system: Secondary | ICD-10-CM
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
## 2020-08-05 ENCOUNTER — Ambulatory Visit: Payer: 59 | Admitting: Physical Therapy

## 2020-08-05 ENCOUNTER — Other Ambulatory Visit: Payer: Self-pay

## 2020-08-05 ENCOUNTER — Encounter: Payer: Self-pay | Admitting: Physical Therapy

## 2020-08-05 ENCOUNTER — Other Ambulatory Visit: Payer: Self-pay | Admitting: Internal Medicine

## 2020-08-05 DIAGNOSIS — R2689 Other abnormalities of gait and mobility: Secondary | ICD-10-CM

## 2020-08-05 DIAGNOSIS — M546 Pain in thoracic spine: Secondary | ICD-10-CM

## 2020-08-05 DIAGNOSIS — M62838 Other muscle spasm: Secondary | ICD-10-CM

## 2020-08-05 DIAGNOSIS — R262 Difficulty in walking, not elsewhere classified: Secondary | ICD-10-CM

## 2020-08-05 DIAGNOSIS — M6281 Muscle weakness (generalized): Secondary | ICD-10-CM

## 2020-08-05 DIAGNOSIS — R29818 Other symptoms and signs involving the nervous system: Secondary | ICD-10-CM
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
## 2020-08-10 ENCOUNTER — Other Ambulatory Visit: Payer: Self-pay

## 2020-08-10 ENCOUNTER — Ambulatory Visit: Payer: 59

## 2020-08-10 DIAGNOSIS — M6281 Muscle weakness (generalized): Secondary | ICD-10-CM

## 2020-08-10 DIAGNOSIS — R262 Difficulty in walking, not elsewhere classified: Secondary | ICD-10-CM

## 2020-08-10 DIAGNOSIS — R2689 Other abnormalities of gait and mobility: Secondary | ICD-10-CM

## 2020-08-10 DIAGNOSIS — M62838 Other muscle spasm: Secondary | ICD-10-CM

## 2020-08-10 DIAGNOSIS — M546 Pain in thoracic spine: Secondary | ICD-10-CM

## 2020-08-10 DIAGNOSIS — R29818 Other symptoms and signs involving the nervous system: Secondary | ICD-10-CM
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
## 2020-08-12 ENCOUNTER — Encounter: Payer: Self-pay | Admitting: Physical Therapy

## 2020-08-12 ENCOUNTER — Ambulatory Visit: Payer: 59 | Admitting: Physical Therapy

## 2020-08-12 ENCOUNTER — Other Ambulatory Visit: Payer: Self-pay

## 2020-08-12 DIAGNOSIS — M6281 Muscle weakness (generalized): Secondary | ICD-10-CM | POA: Diagnosis not present

## 2020-08-12 DIAGNOSIS — M546 Pain in thoracic spine: Secondary | ICD-10-CM

## 2020-08-12 DIAGNOSIS — R262 Difficulty in walking, not elsewhere classified: Secondary | ICD-10-CM

## 2020-08-12 DIAGNOSIS — M62838 Other muscle spasm: Secondary | ICD-10-CM

## 2020-08-12 DIAGNOSIS — R2689 Other abnormalities of gait and mobility: Secondary | ICD-10-CM

## 2020-08-12 DIAGNOSIS — R29818 Other symptoms and signs involving the nervous system: Secondary | ICD-10-CM
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
## 2020-08-13 ENCOUNTER — Other Ambulatory Visit: Payer: Self-pay | Admitting: Internal Medicine

## 2020-08-13 DIAGNOSIS — E119 Type 2 diabetes mellitus without complications: Secondary | ICD-10-CM

## 2020-08-17 ENCOUNTER — Ambulatory Visit: Payer: 59

## 2020-08-17 ENCOUNTER — Other Ambulatory Visit: Payer: Self-pay

## 2020-08-17 DIAGNOSIS — R29818 Other symptoms and signs involving the nervous system: Secondary | ICD-10-CM

## 2020-08-17 DIAGNOSIS — R2689 Other abnormalities of gait and mobility: Secondary | ICD-10-CM

## 2020-08-17 DIAGNOSIS — R262 Difficulty in walking, not elsewhere classified: Secondary | ICD-10-CM

## 2020-08-17 DIAGNOSIS — M62838 Other muscle spasm: Secondary | ICD-10-CM

## 2020-08-17 DIAGNOSIS — M546 Pain in thoracic spine: Secondary | ICD-10-CM

## 2020-08-17 DIAGNOSIS — M6281 Muscle weakness (generalized): Secondary | ICD-10-CM
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
## 2020-08-19 ENCOUNTER — Ambulatory Visit: Payer: 59 | Admitting: Internal Medicine

## 2020-08-19 ENCOUNTER — Ambulatory Visit: Payer: 59 | Admitting: Physical Therapy

## 2020-08-19 VITALS — BP 120/72 | HR 72 | Temp 98.6°F | Wt 238.0 lb

## 2020-08-19 DIAGNOSIS — Z6839 Body mass index (BMI) 39.0-39.9, adult: Secondary | ICD-10-CM

## 2020-08-19 DIAGNOSIS — E1122 Type 2 diabetes mellitus with diabetic chronic kidney disease: Secondary | ICD-10-CM | POA: Diagnosis not present

## 2020-08-19 DIAGNOSIS — I10 Essential (primary) hypertension: Secondary | ICD-10-CM

## 2020-08-19 DIAGNOSIS — R29898 Other symptoms and signs involving the musculoskeletal system: Secondary | ICD-10-CM

## 2020-08-19 DIAGNOSIS — N183 Chronic kidney disease, stage 3 unspecified: Secondary | ICD-10-CM | POA: Diagnosis not present

## 2020-08-19 DIAGNOSIS — G629 Polyneuropathy, unspecified: Secondary | ICD-10-CM

## 2020-08-19 DIAGNOSIS — M5104 Intervertebral disc disorders with myelopathy, thoracic region: Secondary | ICD-10-CM

## 2020-08-19 LAB — COMPREHENSIVE METABOLIC PANEL
ALT: 22 U/L (ref 0–53)
AST: 27 U/L (ref 0–37)
Albumin: 4.8 g/dL (ref 3.5–5.2)
Alkaline Phosphatase: 50 U/L (ref 39–117)
BUN: 25 mg/dL — ABNORMAL HIGH (ref 6–23)
CO2: 30 mEq/L (ref 19–32)
Calcium: 10.5 mg/dL (ref 8.4–10.5)
Chloride: 102 mEq/L (ref 96–112)
Creatinine, Ser: 1.73 mg/dL — ABNORMAL HIGH (ref 0.40–1.50)
GFR: 44.7 mL/min — ABNORMAL LOW (ref >60.00)
Glucose, Bld: 99 mg/dL (ref 70–99)
Potassium: 4.4 mEq/L (ref 3.5–5.1)
Sodium: 138 mEq/L (ref 135–145)
Total Bilirubin: 1.2 mg/dL (ref 0.2–1.2)
Total Protein: 7.9 g/dL (ref 6.0–8.3)

## 2020-08-19 LAB — LIPID PANEL
Cholesterol: 168 mg/dL (ref 0–200)
HDL: 71.8 mg/dL (ref >39.00)
LDL Cholesterol: 85 mg/dL (ref 0–99)
NonHDL: 96.43
Total CHOL/HDL Ratio: 2
Triglycerides: 58 mg/dL (ref 0.0–149.0)
VLDL: 11.6 mg/dL (ref 0.0–40.0)

## 2020-08-19 LAB — CBC WITH DIFFERENTIAL/PLATELET
Basophils Absolute: 0 10*3/uL (ref 0.0–0.1)
Basophils Relative: 0.4 % (ref 0.0–3.0)
Eosinophils Absolute: 0.1 10*3/uL (ref 0.0–0.7)
Eosinophils Relative: 1 % (ref 0.0–5.0)
HCT: 40.4 % (ref 39.0–52.0)
Hemoglobin: 13.4 g/dL (ref 13.0–17.0)
Lymphocytes Relative: 56.1 % — ABNORMAL HIGH (ref 12.0–46.0)
Lymphs Abs: 2.9 10*3/uL (ref 0.7–4.0)
MCHC: 33.2 g/dL (ref 30.0–36.0)
MCV: 92.9 fl (ref 78.0–100.0)
Monocytes Absolute: 0.3 10*3/uL (ref 0.1–1.0)
Monocytes Relative: 6.1 % (ref 3.0–12.0)
Neutro Abs: 1.9 10*3/uL (ref 1.4–7.7)
Neutrophils Relative %: 36.4 % — ABNORMAL LOW (ref 43.0–77.0)
Platelets: 255 10*3/uL (ref 150.0–400.0)
RBC: 4.34 Mil/uL (ref 4.22–5.81)
RDW: 14.5 % (ref 11.5–15.5)
WBC: 5.2 10*3/uL (ref 4.0–10.5)

## 2020-08-19 LAB — TSH: TSH: 1 u[IU]/mL (ref 0.35–4.50)

## 2020-08-19 LAB — PSA: PSA: 0.93 ng/mL (ref 0.10–4.00)

## 2020-08-19 LAB — HEMOGLOBIN A1C: Hgb A1c MFr Bld: 5.7 % (ref 4.6–6.5)

## 2020-08-19 MED ORDER — SYNJARDY 5-1000 MG PO TABS: 1.0000 | ORAL_TABLET | Freq: Two times a day (BID) | ORAL | 1 refills | Status: DC

## 2020-08-19 MED ORDER — GABAPENTIN 300 MG PO CAPS
ORAL_CAPSULE | ORAL | 3 refills | Status: DC
Start: 2020-08-19 — End: 2020-09-06

## 2020-08-19 NOTE — Assessment & Plan Note (Signed)
On Losartan 

## 2020-08-19 NOTE — Patient Instructions (Addendum)
   B-complex with Niacin 100 mg    Lion's mane   ?trekking poles  - [employer], [employer]

## 2020-08-19 NOTE — Progress Notes (Signed)
Subjective:  Patient ID: [name], male    DOB: 1965/12/02  Age: 54 y.o. MRN: [id number]  CC: No chief complaint on file.   HPI [name] presents for DM, HTN S/p thor spine surgery - April 2021  Outpatient Medications Prior to Visit  Medication Sig Dispense Refill  . AMBULATORY NON FORMULARY MEDICATION AFO-Right Lower Extremity 1 Device 0  . aspirin-acetaminophen-caffeine (EXCEDRIN MIGRAINE) 937-902-40 MG tablet Take 2 tablets by mouth every 6 (six) hours as needed for headache.    . Cholecalciferol (VITAMIN D3) 50 MCG (2000 UT) capsule Take 1 capsule (2,000 Units total) by mouth daily. 100 capsule 3  . co-enzyme Q-10 30 MG capsule Take 1 capsule (30 mg total) by mouth 3 (three) times daily. 100 capsule 1  . cyclobenzaprine (FLEXERIL) 5 MG tablet Take 1 tablet (5 mg total) by mouth 3 (three) times daily as needed for muscle spasms. 30 tablet 1  . gabapentin (NEURONTIN) 300 MG capsule TAKE 1 CAPSULE BY MOUTH TWICE DAILY AT NIGHT Overdue for Annual appt must see provider for future refills 60 capsule 0  . losartan (COZAAR) 100 MG tablet TAKE 1 TABLET(100 MG) BY MOUTH DAILY 90 tablet 3  . magnesium hydroxide (MILK OF MAGNESIA) 400 MG/5ML suspension 5-10 ml prn 355 mL 0  . oxyCODONE-acetaminophen (PERCOCET/ROXICET) 5-325 MG tablet Take 1-2 tablets by mouth every 6 (six) hours as needed for severe pain. 40 tablet 0  . senna-docusate (SENOKOT-S) 8.6-50 MG tablet Take 2 tablets by mouth 2 (two) times daily. While on pain meds 100 tablet 6  . SYNJARDY 12-998 MG TABS TAKE 1 TABLET BY MOUTH TWICE DAILY 180 tablet 1  . vitamin B-12 (CYANOCOBALAMIN) 1000 MCG tablet Take 1 tablet (1,000 mcg total) by mouth daily. 100 tablet 3   No facility-administered medications prior to visit.    ROS: Review of Systems  Constitutional: Positive for unexpected weight change. Negative for appetite change and fatigue.  HENT: Negative for congestion, nosebleeds, sneezing, sore throat and trouble  swallowing.   Eyes: Negative for itching and visual disturbance.  Respiratory: Negative for cough.   Cardiovascular: Negative for chest pain, palpitations and leg swelling.  Gastrointestinal: Negative for abdominal distention, blood in stool, diarrhea and nausea.  Genitourinary: Negative for frequency and hematuria.  Musculoskeletal: Positive for gait problem. Negative for back pain, joint swelling and neck pain.  Skin: Negative for rash.  Neurological: Positive for weakness. Negative for dizziness, tremors and speech difficulty.  Psychiatric/Behavioral: Negative for agitation, dysphoric mood and sleep disturbance. The patient is not nervous/anxious.     Objective:  There were no vitals taken for this visit.  BP Readings from Last 3 Encounters:  10/30/19 130/84  10/08/19 (!) 154/92  09/24/19 138/86    Wt Readings from Last 3 Encounters:  10/30/19 286 lb (129.7 kg)  10/08/19 292 lb (132.5 kg)  09/24/19 286 lb (129.7 kg)    Physical Exam Constitutional:      General: He is not in acute distress.    Appearance: He is well-developed.     Comments: NAD  HENT:     Mouth/Throat:     Mouth: Oropharynx is clear and moist.  Eyes:     Conjunctiva/sclera: Conjunctivae normal.     Pupils: Pupils are equal, round, and reactive to light.  Neck:     Thyroid: No thyromegaly.     Vascular: No JVD.  Cardiovascular:     Rate and Rhythm: Normal rate and regular rhythm.  Pulses: Intact distal pulses.     Heart sounds: Normal heart sounds. No murmur heard. No friction rub. No gallop.   Pulmonary:     Effort: Pulmonary effort is normal. No respiratory distress.     Breath sounds: Normal breath sounds. No wheezing or rales.  Chest:     Chest wall: No tenderness.  Abdominal:     General: Bowel sounds are normal. There is no distension.     Palpations: Abdomen is soft. There is no mass.     Tenderness: There is no abdominal tenderness. There is no guarding or rebound.  Musculoskeletal:         General: No tenderness or edema. Normal range of motion.     Cervical back: Normal range of motion.  Lymphadenopathy:     Cervical: No cervical adenopathy.  Skin:    General: Skin is warm and dry.     Findings: No rash.  Neurological:     Mental Status: He is alert and oriented to person, place, and time.     Cranial Nerves: No cranial nerve deficit.     Motor: Weakness present. No abnormal muscle tone.     Coordination: He displays a negative Romberg sign. Coordination abnormal.     Gait: Gait abnormal.     Deep Tendon Reflexes: Reflexes are normal and symmetric.  Psychiatric:        Mood and Affect: Mood and affect normal.        Behavior: Behavior normal.        Thought Content: Thought content normal.        Judgment: Judgment normal.   Limping, weak right leg  Lab Results  Component Value Date   WBC 5.7 09/18/2018   HGB 14.9 09/18/2018   HCT 44.1 09/18/2018   PLT 201.0 09/18/2018   GLUCOSE 103 (H) 07/02/2019   CHOL 175 09/18/2018   TRIG 55.0 09/18/2018   HDL 70.30 09/18/2018   LDLCALC 94 09/18/2018   ALT 17 09/18/2018   AST 21 09/18/2018   NA 138 07/02/2019   K 4.6 07/02/2019   CL 104 07/02/2019   CREATININE 1.70 (H) 07/02/2019   BUN 22 07/02/2019   CO2 29 07/02/2019   TSH 0.86 09/18/2018   PSA 0.97 10/01/2017   HGBA1C 5.9 07/02/2019   MICROALBUR 4.9 (H) 10/01/2017    DG Thoracic Spine 2 View  Result Date: 07/05/2020 CLINICAL DATA:  Status post fusion EXAM: THORACIC SPINE 2 VIEWS COMPARISON:  March 30, 2020 FINDINGS: Frontal and lateral views obtained. There is stable appearing screw and plate fixation from 579FGE. There are bilateral pedicle screws at T8, T11, and T12 with left-sided pedicle screws T9 and T10, unchanged in position. All screw tips are in the respective vertebral bodies. No evident fracture or spondylolisthesis. There is moderate disc space narrowing at several levels, most notably at T7-8. No erosive change or paraspinous lesions.  IMPRESSION: Stable posterior fusion from T8-T12. Osteoarthritic change at several levels. No fracture or spondylolisthesis. No erosion. Electronically Signed   By: Lowella Grip III M.D.   On: 07/05/2020 08:21    Assessment & Plan:    Walker Kehr, MD

## 2020-08-23 NOTE — Assessment & Plan Note (Signed)
The patient lost weight.  Will watch.

## 2020-08-23 NOTE — Assessment & Plan Note (Signed)
Try lion's mane ?trekking poles

## 2020-08-23 NOTE — Assessment & Plan Note (Addendum)
S/p  thoracic back surgery T8-12 fusion at [facility] 5/21.  In therapy.  Slow recovery. Lion's mane ?trekking poles

## 2020-08-23 NOTE — Assessment & Plan Note (Signed)
In therapy.  Slow progress

## 2020-08-24 ENCOUNTER — Ambulatory Visit: Payer: 59

## 2020-08-24 ENCOUNTER — Other Ambulatory Visit: Payer: Self-pay

## 2020-08-24 DIAGNOSIS — R2689 Other abnormalities of gait and mobility: Secondary | ICD-10-CM

## 2020-08-24 DIAGNOSIS — M6281 Muscle weakness (generalized): Secondary | ICD-10-CM

## 2020-08-24 DIAGNOSIS — R29818 Other symptoms and signs involving the nervous system: Secondary | ICD-10-CM

## 2020-08-24 DIAGNOSIS — M62838 Other muscle spasm: Secondary | ICD-10-CM

## 2020-08-24 DIAGNOSIS — R262 Difficulty in walking, not elsewhere classified: Secondary | ICD-10-CM

## 2020-08-24 DIAGNOSIS — M546 Pain in thoracic spine: Secondary | ICD-10-CM
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
## 2020-08-26 ENCOUNTER — Other Ambulatory Visit: Payer: Self-pay

## 2020-08-26 ENCOUNTER — Ambulatory Visit: Payer: 59

## 2020-08-26 DIAGNOSIS — M6281 Muscle weakness (generalized): Secondary | ICD-10-CM

## 2020-08-26 DIAGNOSIS — M546 Pain in thoracic spine: Secondary | ICD-10-CM

## 2020-08-26 DIAGNOSIS — R262 Difficulty in walking, not elsewhere classified: Secondary | ICD-10-CM

## 2020-08-26 DIAGNOSIS — M62838 Other muscle spasm: Secondary | ICD-10-CM

## 2020-08-26 DIAGNOSIS — R29818 Other symptoms and signs involving the nervous system: Secondary | ICD-10-CM

## 2020-08-26 DIAGNOSIS — R2689 Other abnormalities of gait and mobility: Secondary | ICD-10-CM
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
## 2020-08-31 ENCOUNTER — Ambulatory Visit: Payer: 59 | Admitting: Physical Therapy

## 2020-09-02 ENCOUNTER — Ambulatory Visit: Payer: 59 | Admitting: Physical Therapy

## 2020-09-03 ENCOUNTER — Other Ambulatory Visit: Payer: Self-pay

## 2020-09-03 ENCOUNTER — Ambulatory Visit (INDEPENDENT_AMBULATORY_CARE_PROVIDER_SITE_OTHER): Payer: BC Managed Care – PPO | Admitting: Physical Therapy

## 2020-09-03 DIAGNOSIS — M546 Pain in thoracic spine: Secondary | ICD-10-CM

## 2020-09-03 DIAGNOSIS — R2689 Other abnormalities of gait and mobility: Secondary | ICD-10-CM

## 2020-09-03 DIAGNOSIS — M6281 Muscle weakness (generalized): Secondary | ICD-10-CM | POA: Diagnosis not present

## 2020-09-03 DIAGNOSIS — R262 Difficulty in walking, not elsewhere classified: Secondary | ICD-10-CM

## 2020-09-03 DIAGNOSIS — R29818 Other symptoms and signs involving the nervous system: Secondary | ICD-10-CM | POA: Diagnosis not present

## 2020-09-03 NOTE — Patient Instructions (Signed)
Access Code: [email or web address] URL: [email or web address]: 01/07/2022Prepared by: [place]  Standing Gastroc Stretch at Counter - 2 x daily - 7 x weekly - 2 sets - 30 hold  Clamshell - 2 x daily - 7 x weekly - 2 sets - 10 reps  Seated Ankle Dorsiflexion with Resistance - 2 x daily - 7 x weekly - 2 sets - 10 reps  Seated Hamstring Curl with Anchored Resistance - 2 x daily - 7 x weekly - 2 sets - 10 reps  Sit to Stand without Arm Support - 2 x daily - 7 x weekly - 2 sets - 10 reps  Standing Toe Taps - 2 x daily - 7 x weekly - 2 sets - 10 reps  Mini Squat with Counter Support - 2 x daily - 7 x weekly - 2 sets - 10 reps  Side Stepping with Resistance at Thighs - 2 x daily - 7 x weekly - 2 sets - 10 reps  Standing Terminal Knee Extension with Resistance - 2 x daily - 7 x weekly - 2 sets - 10 reps - 3 hold   Seated Hamstring Stretch - 2 x daily - 7 x weekly - 2 sets - 30 hold  Sit to Stand with Hands on Knees - 2 x daily - 7 x weekly - 1 sets - 5 reps  Seated March - 2 x daily - 7 x weekly - 1 sets - 10 reps  Small Range Straight Leg Raise - 2 x daily - 7 x weekly - 1 sets - 5 reps  Supine Bridge with Resistance Band - 2 x daily - 7 x weekly - 2 sets - 10 reps
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
## 2020-09-04 ENCOUNTER — Other Ambulatory Visit: Payer: Self-pay | Admitting: Internal Medicine

## 2020-09-07 ENCOUNTER — Ambulatory Visit: Payer: BC Managed Care – PPO | Admitting: Physical Therapy

## 2020-09-07 ENCOUNTER — Other Ambulatory Visit: Payer: Self-pay

## 2020-09-07 DIAGNOSIS — R262 Difficulty in walking, not elsewhere classified: Secondary | ICD-10-CM

## 2020-09-07 DIAGNOSIS — R2689 Other abnormalities of gait and mobility: Secondary | ICD-10-CM

## 2020-09-07 DIAGNOSIS — M6281 Muscle weakness (generalized): Secondary | ICD-10-CM | POA: Diagnosis not present

## 2020-09-07 DIAGNOSIS — R29818 Other symptoms and signs involving the nervous system: Secondary | ICD-10-CM

## 2020-09-07 DIAGNOSIS — M62838 Other muscle spasm: Secondary | ICD-10-CM
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
## 2020-09-10 ENCOUNTER — Encounter: Payer: Self-pay | Admitting: Physical Therapy

## 2020-09-10 ENCOUNTER — Other Ambulatory Visit: Payer: Self-pay

## 2020-09-10 ENCOUNTER — Ambulatory Visit: Payer: BC Managed Care – PPO | Admitting: Physical Therapy

## 2020-09-10 DIAGNOSIS — R29818 Other symptoms and signs involving the nervous system: Secondary | ICD-10-CM | POA: Diagnosis not present

## 2020-09-10 DIAGNOSIS — M6281 Muscle weakness (generalized): Secondary | ICD-10-CM | POA: Diagnosis not present

## 2020-09-10 DIAGNOSIS — R2689 Other abnormalities of gait and mobility: Secondary | ICD-10-CM | POA: Diagnosis not present

## 2020-09-10 DIAGNOSIS — R262 Difficulty in walking, not elsewhere classified: Secondary | ICD-10-CM
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
## 2020-09-14 ENCOUNTER — Encounter: Payer: 59 | Admitting: Physical Therapy

## 2020-09-16 ENCOUNTER — Encounter: Payer: 59 | Admitting: Physical Therapy

## 2020-09-17 ENCOUNTER — Other Ambulatory Visit: Payer: Self-pay

## 2020-09-17 ENCOUNTER — Encounter: Payer: Self-pay | Admitting: Rehabilitative and Restorative Service Providers"

## 2020-09-17 ENCOUNTER — Ambulatory Visit: Payer: BC Managed Care – PPO | Admitting: Rehabilitative and Restorative Service Providers"

## 2020-09-17 DIAGNOSIS — R2689 Other abnormalities of gait and mobility: Secondary | ICD-10-CM | POA: Diagnosis not present

## 2020-09-17 DIAGNOSIS — R262 Difficulty in walking, not elsewhere classified: Secondary | ICD-10-CM | POA: Diagnosis not present

## 2020-09-17 DIAGNOSIS — R29818 Other symptoms and signs involving the nervous system: Secondary | ICD-10-CM | POA: Diagnosis not present

## 2020-09-17 DIAGNOSIS — M62838 Other muscle spasm: Secondary | ICD-10-CM

## 2020-09-17 DIAGNOSIS — M546 Pain in thoracic spine: Secondary | ICD-10-CM

## 2020-09-17 DIAGNOSIS — M6281 Muscle weakness (generalized): Secondary | ICD-10-CM | POA: Diagnosis not present
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
## 2020-09-21 ENCOUNTER — Ambulatory Visit: Payer: BC Managed Care – PPO | Admitting: Physical Therapy

## 2020-09-21 ENCOUNTER — Other Ambulatory Visit: Payer: Self-pay

## 2020-09-21 ENCOUNTER — Encounter: Payer: Self-pay | Admitting: Physical Therapy

## 2020-09-21 DIAGNOSIS — R2689 Other abnormalities of gait and mobility: Secondary | ICD-10-CM | POA: Diagnosis not present

## 2020-09-21 DIAGNOSIS — R262 Difficulty in walking, not elsewhere classified: Secondary | ICD-10-CM | POA: Diagnosis not present

## 2020-09-21 DIAGNOSIS — M6281 Muscle weakness (generalized): Secondary | ICD-10-CM | POA: Diagnosis not present

## 2020-09-21 DIAGNOSIS — R29818 Other symptoms and signs involving the nervous system: Secondary | ICD-10-CM | POA: Diagnosis not present
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
## 2020-09-28 ENCOUNTER — Encounter: Payer: BC Managed Care – PPO | Admitting: Physical Therapy

## 2020-10-01 ENCOUNTER — Ambulatory Visit: Payer: BC Managed Care – PPO | Admitting: Physical Therapy

## 2020-10-01 ENCOUNTER — Other Ambulatory Visit: Payer: Self-pay

## 2020-10-01 DIAGNOSIS — R262 Difficulty in walking, not elsewhere classified: Secondary | ICD-10-CM

## 2020-10-01 DIAGNOSIS — M6281 Muscle weakness (generalized): Secondary | ICD-10-CM

## 2020-10-01 DIAGNOSIS — R2689 Other abnormalities of gait and mobility: Secondary | ICD-10-CM | POA: Diagnosis not present

## 2020-10-01 DIAGNOSIS — R29818 Other symptoms and signs involving the nervous system: Secondary | ICD-10-CM
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
## 2020-10-06 ENCOUNTER — Ambulatory Visit: Payer: BC Managed Care – PPO | Admitting: Physical Therapy

## 2020-10-06 ENCOUNTER — Other Ambulatory Visit: Payer: Self-pay

## 2020-10-06 ENCOUNTER — Other Ambulatory Visit: Payer: Self-pay | Admitting: Internal Medicine

## 2020-10-06 DIAGNOSIS — R29818 Other symptoms and signs involving the nervous system: Secondary | ICD-10-CM

## 2020-10-06 DIAGNOSIS — M6281 Muscle weakness (generalized): Secondary | ICD-10-CM

## 2020-10-06 DIAGNOSIS — R2689 Other abnormalities of gait and mobility: Secondary | ICD-10-CM | POA: Diagnosis not present

## 2020-10-06 DIAGNOSIS — R262 Difficulty in walking, not elsewhere classified: Secondary | ICD-10-CM | POA: Diagnosis not present
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
## 2020-10-15 ENCOUNTER — Ambulatory Visit: Payer: BC Managed Care – PPO | Admitting: Physical Therapy

## 2020-10-15 ENCOUNTER — Other Ambulatory Visit: Payer: Self-pay

## 2020-10-15 DIAGNOSIS — R262 Difficulty in walking, not elsewhere classified: Secondary | ICD-10-CM | POA: Diagnosis not present

## 2020-10-15 DIAGNOSIS — R2689 Other abnormalities of gait and mobility: Secondary | ICD-10-CM | POA: Diagnosis not present

## 2020-10-15 DIAGNOSIS — M6281 Muscle weakness (generalized): Secondary | ICD-10-CM

## 2020-10-15 DIAGNOSIS — R29818 Other symptoms and signs involving the nervous system: Secondary | ICD-10-CM
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
## 2020-10-22 ENCOUNTER — Ambulatory Visit: Payer: BC Managed Care – PPO | Admitting: Physical Therapy

## 2020-10-22 ENCOUNTER — Other Ambulatory Visit: Payer: Self-pay

## 2020-10-22 DIAGNOSIS — R2689 Other abnormalities of gait and mobility: Secondary | ICD-10-CM

## 2020-10-22 DIAGNOSIS — R29818 Other symptoms and signs involving the nervous system: Secondary | ICD-10-CM | POA: Diagnosis not present

## 2020-10-22 DIAGNOSIS — M6281 Muscle weakness (generalized): Secondary | ICD-10-CM | POA: Diagnosis not present

## 2020-10-22 DIAGNOSIS — R262 Difficulty in walking, not elsewhere classified: Secondary | ICD-10-CM
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
## 2020-10-29 ENCOUNTER — Encounter: Payer: Self-pay | Admitting: Physical Therapy

## 2020-10-29 ENCOUNTER — Ambulatory Visit: Payer: BC Managed Care – PPO | Admitting: Physical Therapy

## 2020-10-29 ENCOUNTER — Other Ambulatory Visit: Payer: Self-pay

## 2020-10-29 DIAGNOSIS — R2689 Other abnormalities of gait and mobility: Secondary | ICD-10-CM

## 2020-10-29 DIAGNOSIS — R29818 Other symptoms and signs involving the nervous system: Secondary | ICD-10-CM | POA: Diagnosis not present

## 2020-10-29 DIAGNOSIS — R262 Difficulty in walking, not elsewhere classified: Secondary | ICD-10-CM | POA: Diagnosis not present

## 2020-10-29 DIAGNOSIS — M6281 Muscle weakness (generalized): Secondary | ICD-10-CM
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
## 2020-11-03 ENCOUNTER — Other Ambulatory Visit: Payer: Self-pay | Admitting: Internal Medicine

## 2020-11-05 ENCOUNTER — Other Ambulatory Visit: Payer: Self-pay

## 2020-11-05 ENCOUNTER — Ambulatory Visit: Payer: BC Managed Care – PPO | Admitting: Physical Therapy

## 2020-11-05 DIAGNOSIS — M6281 Muscle weakness (generalized): Secondary | ICD-10-CM

## 2020-11-05 DIAGNOSIS — R2689 Other abnormalities of gait and mobility: Secondary | ICD-10-CM

## 2020-11-05 DIAGNOSIS — R29818 Other symptoms and signs involving the nervous system: Secondary | ICD-10-CM

## 2020-11-05 DIAGNOSIS — R262 Difficulty in walking, not elsewhere classified: Secondary | ICD-10-CM
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
## 2020-11-12 ENCOUNTER — Other Ambulatory Visit: Payer: Self-pay

## 2020-11-12 ENCOUNTER — Ambulatory Visit (INDEPENDENT_AMBULATORY_CARE_PROVIDER_SITE_OTHER): Payer: BC Managed Care – PPO | Admitting: Physical Therapy

## 2020-11-12 ENCOUNTER — Encounter: Payer: Self-pay | Admitting: Physical Therapy

## 2020-11-12 DIAGNOSIS — R262 Difficulty in walking, not elsewhere classified: Secondary | ICD-10-CM

## 2020-11-12 DIAGNOSIS — M6281 Muscle weakness (generalized): Secondary | ICD-10-CM | POA: Diagnosis not present

## 2020-11-12 DIAGNOSIS — R2689 Other abnormalities of gait and mobility: Secondary | ICD-10-CM
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
## 2020-11-19 ENCOUNTER — Encounter: Payer: BC Managed Care – PPO | Admitting: Physical Therapy

## 2020-11-23 ENCOUNTER — Encounter: Payer: Self-pay | Admitting: Internal Medicine

## 2020-11-23 ENCOUNTER — Other Ambulatory Visit: Payer: Self-pay

## 2020-11-23 ENCOUNTER — Ambulatory Visit (INDEPENDENT_AMBULATORY_CARE_PROVIDER_SITE_OTHER): Payer: BC Managed Care – PPO | Admitting: Internal Medicine

## 2020-11-23 DIAGNOSIS — N183 Chronic kidney disease, stage 3 unspecified: Secondary | ICD-10-CM | POA: Insufficient documentation

## 2020-11-23 DIAGNOSIS — L309 Dermatitis, unspecified: Secondary | ICD-10-CM | POA: Diagnosis not present

## 2020-11-23 DIAGNOSIS — M5104 Intervertebral disc disorders with myelopathy, thoracic region: Secondary | ICD-10-CM | POA: Diagnosis not present

## 2020-11-23 DIAGNOSIS — E119 Type 2 diabetes mellitus without complications: Secondary | ICD-10-CM

## 2020-11-23 DIAGNOSIS — Z6839 Body mass index (BMI) 39.0-39.9, adult: Secondary | ICD-10-CM

## 2020-11-23 DIAGNOSIS — E1122 Type 2 diabetes mellitus with diabetic chronic kidney disease: Secondary | ICD-10-CM

## 2020-11-23 LAB — COMPREHENSIVE METABOLIC PANEL WITH GFR
ALT: 15 U/L (ref 0–53)
AST: 21 U/L (ref 0–37)
Albumin: 4.8 g/dL (ref 3.5–5.2)
Alkaline Phosphatase: 50 U/L (ref 39–117)
BUN: 36 mg/dL — ABNORMAL HIGH (ref 6–23)
CO2: 28 meq/L (ref 19–32)
Calcium: 10.6 mg/dL — ABNORMAL HIGH (ref 8.4–10.5)
Chloride: 102 meq/L (ref 96–112)
Creatinine, Ser: 2.02 mg/dL — ABNORMAL HIGH (ref 0.40–1.50)
GFR: 37.05 mL/min — ABNORMAL LOW
Glucose, Bld: 70 mg/dL (ref 70–99)
Potassium: 4.2 meq/L (ref 3.5–5.1)
Sodium: 138 meq/L (ref 135–145)
Total Bilirubin: 1.2 mg/dL (ref 0.2–1.2)
Total Protein: 8.4 g/dL — ABNORMAL HIGH (ref 6.0–8.3)

## 2020-11-23 LAB — HEMOGLOBIN A1C: Hgb A1c MFr Bld: 5.8 % (ref 4.6–6.5)

## 2020-11-23 NOTE — Assessment & Plan Note (Signed)
Doing well on Synjardy Check A1c

## 2020-11-23 NOTE — Progress Notes (Signed)
Subjective:  Patient ID: [name], male    DOB: 1966/05/06  Age: 55 y.o. MRN: [id number]  CC: Follow-up (3 month f/u)   HPI [name] presents for LBP, R leg weakness and DM f/u, gait disorder. In PT. Feeling better  Outpatient Medications Prior to Visit  Medication Sig Dispense Refill  . AMBULATORY NON FORMULARY MEDICATION AFO-Right Lower Extremity 1 Device 0  . aspirin-acetaminophen-caffeine (EXCEDRIN MIGRAINE) 595-638-75 MG tablet Take 2 tablets by mouth every 6 (six) hours as needed for headache.    . Cholecalciferol (VITAMIN D3) 50 MCG (2000 UT) capsule Take 1 capsule (2,000 Units total) by mouth daily. 100 capsule 3  . losartan (COZAAR) 100 MG tablet TAKE 1 TABLET(100 MG) BY MOUTH DAILY 90 tablet 2  . SYNJARDY 12-998 MG TABS TAKE 1 TABLET BY MOUTH TWICE DAILY 180 tablet 1  . vitamin B-12 (CYANOCOBALAMIN) 1000 MCG tablet Take 1 tablet (1,000 mcg total) by mouth daily. 100 tablet 3  . gabapentin (NEURONTIN) 300 MG capsule TAKE ONE CAPSULE BY MOUTH TWICE DAILY AT NIGHT (Patient not taking: Reported on 11/23/2020) 60 capsule 3   No facility-administered medications prior to visit.    ROS: Review of Systems  Constitutional: Negative for appetite change, fatigue and unexpected weight change.  HENT: Negative for congestion, nosebleeds, sneezing, sore throat and trouble swallowing.   Eyes: Negative for itching and visual disturbance.  Respiratory: Negative for cough.   Cardiovascular: Negative for chest pain, palpitations and leg swelling.  Gastrointestinal: Negative for abdominal distention, blood in stool, diarrhea and nausea.  Genitourinary: Negative for frequency and hematuria.  Musculoskeletal: Positive for arthralgias, back pain and gait problem. Negative for joint swelling and neck pain.  Skin: Negative for rash.  Neurological: Negative for dizziness, tremors, speech difficulty and weakness.  Psychiatric/Behavioral: Negative for agitation, dysphoric mood and sleep  disturbance. The patient is not nervous/anxious.     Objective:  BP 138/82 (BP Location: Left Arm)   Pulse 70   Temp 98.1 F (36.7 C) (Oral)   Ht 6\' 1"  (1.854 m)   Wt 233 lb 3.2 oz (105.8 kg)   SpO2 96%   BMI 30.77 kg/m   BP Readings from Last 3 Encounters:  11/23/20 138/82  08/19/20 120/72  10/30/19 130/84    Wt Readings from Last 3 Encounters:  11/23/20 233 lb 3.2 oz (105.8 kg)  08/19/20 238 lb (108 kg)  10/30/19 286 lb (129.7 kg)    Physical Exam Constitutional:      General: He is not in acute distress.    Appearance: He is well-developed.     Comments: NAD  Eyes:     Conjunctiva/sclera: Conjunctivae normal.     Pupils: Pupils are equal, round, and reactive to light.  Neck:     Thyroid: No thyromegaly.     Vascular: No JVD.  Cardiovascular:     Rate and Rhythm: Normal rate and regular rhythm.     Heart sounds: Normal heart sounds. No murmur heard. No friction rub. No gallop.   Pulmonary:     Effort: Pulmonary effort is normal. No respiratory distress.     Breath sounds: Normal breath sounds. No wheezing or rales.  Chest:     Chest wall: No tenderness.  Abdominal:     General: Bowel sounds are normal. There is no distension.     Palpations: Abdomen is soft. There is no mass.     Tenderness: There is no abdominal tenderness. There is no guarding or rebound.  Musculoskeletal:        General: Tenderness present. Normal range of motion.     Cervical back: Normal range of motion.  Lymphadenopathy:     Cervical: No cervical adenopathy.  Skin:    General: Skin is warm and dry.     Findings: No rash.  Neurological:     Mental Status: He is alert and oriented to person, place, and time.     Cranial Nerves: No cranial nerve deficit.     Motor: Weakness present. No abnormal muscle tone.     Coordination: Coordination abnormal.     Gait: Gait abnormal.     Deep Tendon Reflexes: Reflexes are normal and symmetric.  Psychiatric:        Behavior: Behavior normal.         Thought Content: Thought content normal.        Judgment: Judgment normal.    Limping, right leg is weak  Lab Results  Component Value Date   WBC 5.2 08/19/2020   HGB 13.4 08/19/2020   HCT 40.4 08/19/2020   PLT 255.0 08/19/2020   GLUCOSE 99 08/19/2020   CHOL 168 08/19/2020   TRIG 58.0 08/19/2020   HDL 71.80 08/19/2020   LDLCALC 85 08/19/2020   ALT 22 08/19/2020   AST 27 08/19/2020   NA 138 08/19/2020   K 4.4 08/19/2020   CL 102 08/19/2020   CREATININE 1.73 (H) 08/19/2020   BUN 25 (H) 08/19/2020   CO2 30 08/19/2020   TSH 1.00 08/19/2020   PSA 0.93 08/19/2020   HGBA1C 5.7 08/19/2020   MICROALBUR 4.9 (H) 10/01/2017    DG Thoracic Spine 2 View  Result Date: 07/05/2020 CLINICAL DATA:  Status post fusion EXAM: THORACIC SPINE 2 VIEWS COMPARISON:  March 30, 2020 FINDINGS: Frontal and lateral views obtained. There is stable appearing screw and plate fixation from T2-K46. There are bilateral pedicle screws at T8, T11, and T12 with left-sided pedicle screws T9 and T10, unchanged in position. All screw tips are in the respective vertebral bodies. No evident fracture or spondylolisthesis. There is moderate disc space narrowing at several levels, most notably at T7-8. No erosive change or paraspinous lesions. IMPRESSION: Stable posterior fusion from T8-T12. Osteoarthritic change at several levels. No fracture or spondylolisthesis. No erosion. Electronically Signed   By: Lowella Grip III M.D.   On: 07/05/2020 08:21    Assessment & Plan:    Walker Kehr, MD

## 2020-11-23 NOTE — Assessment & Plan Note (Addendum)
Wt Readings from Last 3 Encounters:  11/23/20 233 lb 3.2 oz (105.8 kg)  08/19/20 238 lb (108 kg)  10/30/19 286 lb (129.7 kg)   Time to built muscle mass

## 2020-11-23 NOTE — Assessment & Plan Note (Signed)
S/p  thoracic back surgery T8-12 fusion at [facility] 5/21 In PT now

## 2020-11-23 NOTE — Assessment & Plan Note (Signed)
No relapse 

## 2020-11-23 NOTE — Assessment & Plan Note (Signed)
Continue with good hydration and current losartan and Synjardy.  Avoid NSAIDs.  Follow-up with nephrology. We may need to reduce his Metformin and losartan dose

## 2020-11-24 ENCOUNTER — Encounter: Payer: BC Managed Care – PPO | Admitting: Physical Therapy

## 2020-11-25 ENCOUNTER — Encounter: Payer: BC Managed Care – PPO | Admitting: Physical Therapy

## 2020-11-26 ENCOUNTER — Encounter: Payer: BC Managed Care – PPO | Admitting: Rehabilitative and Restorative Service Providers"

## 2020-11-26 HISTORY — PX: BACK SURGERY: SHX140

## 2020-12-03 ENCOUNTER — Other Ambulatory Visit: Payer: Self-pay

## 2020-12-03 ENCOUNTER — Ambulatory Visit: Payer: BC Managed Care – PPO | Admitting: Physical Therapy

## 2020-12-03 DIAGNOSIS — M6281 Muscle weakness (generalized): Secondary | ICD-10-CM | POA: Diagnosis not present

## 2020-12-03 DIAGNOSIS — R2689 Other abnormalities of gait and mobility: Secondary | ICD-10-CM | POA: Diagnosis not present

## 2020-12-03 DIAGNOSIS — R262 Difficulty in walking, not elsewhere classified: Secondary | ICD-10-CM | POA: Diagnosis not present

## 2020-12-03 DIAGNOSIS — R29818 Other symptoms and signs involving the nervous system: Secondary | ICD-10-CM
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
## 2020-12-08 ENCOUNTER — Other Ambulatory Visit: Payer: Self-pay

## 2020-12-08 ENCOUNTER — Ambulatory Visit (INDEPENDENT_AMBULATORY_CARE_PROVIDER_SITE_OTHER): Payer: BC Managed Care – PPO | Admitting: Physical Therapy

## 2020-12-08 ENCOUNTER — Encounter: Payer: Self-pay | Admitting: Physical Therapy

## 2020-12-08 DIAGNOSIS — R262 Difficulty in walking, not elsewhere classified: Secondary | ICD-10-CM

## 2020-12-08 DIAGNOSIS — R2689 Other abnormalities of gait and mobility: Secondary | ICD-10-CM

## 2020-12-08 DIAGNOSIS — M6281 Muscle weakness (generalized): Secondary | ICD-10-CM | POA: Diagnosis not present
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
## 2020-12-17 ENCOUNTER — Other Ambulatory Visit: Payer: Self-pay

## 2020-12-17 ENCOUNTER — Ambulatory Visit: Payer: BC Managed Care – PPO | Admitting: Physical Therapy

## 2020-12-17 DIAGNOSIS — M6281 Muscle weakness (generalized): Secondary | ICD-10-CM

## 2020-12-17 DIAGNOSIS — R262 Difficulty in walking, not elsewhere classified: Secondary | ICD-10-CM

## 2020-12-17 DIAGNOSIS — R2689 Other abnormalities of gait and mobility: Secondary | ICD-10-CM

## 2020-12-17 DIAGNOSIS — R29818 Other symptoms and signs involving the nervous system: Secondary | ICD-10-CM
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
## 2020-12-24 ENCOUNTER — Other Ambulatory Visit: Payer: Self-pay

## 2020-12-24 ENCOUNTER — Ambulatory Visit: Payer: BC Managed Care – PPO | Admitting: Physical Therapy

## 2020-12-24 DIAGNOSIS — R262 Difficulty in walking, not elsewhere classified: Secondary | ICD-10-CM

## 2020-12-24 DIAGNOSIS — R2689 Other abnormalities of gait and mobility: Secondary | ICD-10-CM | POA: Diagnosis not present

## 2020-12-24 DIAGNOSIS — R29818 Other symptoms and signs involving the nervous system: Secondary | ICD-10-CM

## 2020-12-24 DIAGNOSIS — M6281 Muscle weakness (generalized): Secondary | ICD-10-CM
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
## 2020-12-27 ENCOUNTER — Other Ambulatory Visit (HOSPITAL_COMMUNITY): Payer: Self-pay

## 2020-12-31 ENCOUNTER — Ambulatory Visit: Payer: BC Managed Care – PPO | Admitting: Physical Therapy

## 2020-12-31 ENCOUNTER — Other Ambulatory Visit: Payer: Self-pay

## 2020-12-31 DIAGNOSIS — R29818 Other symptoms and signs involving the nervous system: Secondary | ICD-10-CM | POA: Diagnosis not present

## 2020-12-31 DIAGNOSIS — R2689 Other abnormalities of gait and mobility: Secondary | ICD-10-CM | POA: Diagnosis not present

## 2020-12-31 DIAGNOSIS — R262 Difficulty in walking, not elsewhere classified: Secondary | ICD-10-CM

## 2020-12-31 DIAGNOSIS — M6281 Muscle weakness (generalized): Secondary | ICD-10-CM | POA: Diagnosis not present
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
## 2021-01-14 ENCOUNTER — Other Ambulatory Visit: Payer: Self-pay | Admitting: Internal Medicine

## 2021-01-14 DIAGNOSIS — Z981 Arthrodesis status: Secondary | ICD-10-CM

## 2021-01-14 DIAGNOSIS — M5104 Intervertebral disc disorders with myelopathy, thoracic region: Secondary | ICD-10-CM

## 2021-01-18 ENCOUNTER — Other Ambulatory Visit: Payer: Self-pay

## 2021-01-18 ENCOUNTER — Ambulatory Visit (INDEPENDENT_AMBULATORY_CARE_PROVIDER_SITE_OTHER): Payer: BC Managed Care – PPO

## 2021-01-18 DIAGNOSIS — E1129 Type 2 diabetes mellitus with other diabetic kidney complication: Secondary | ICD-10-CM | POA: Diagnosis not present

## 2021-01-18 DIAGNOSIS — M5104 Intervertebral disc disorders with myelopathy, thoracic region: Secondary | ICD-10-CM | POA: Diagnosis not present

## 2021-01-18 DIAGNOSIS — M4324 Fusion of spine, thoracic region: Secondary | ICD-10-CM | POA: Diagnosis not present

## 2021-01-18 DIAGNOSIS — Z981 Arthrodesis status: Secondary | ICD-10-CM

## 2021-01-18 DIAGNOSIS — D72820 Lymphocytosis (symptomatic): Secondary | ICD-10-CM | POA: Diagnosis not present

## 2021-01-18 DIAGNOSIS — N1831 Chronic kidney disease, stage 3a: Secondary | ICD-10-CM | POA: Diagnosis not present

## 2021-01-18 DIAGNOSIS — I129 Hypertensive chronic kidney disease with stage 1 through stage 4 chronic kidney disease, or unspecified chronic kidney disease: Secondary | ICD-10-CM | POA: Diagnosis not present

## 2021-01-20 ENCOUNTER — Ambulatory Visit (INDEPENDENT_AMBULATORY_CARE_PROVIDER_SITE_OTHER)
Admission: RE | Admit: 2021-01-20 | Discharge: 2021-01-20 | Disposition: A | Discharge status: Home / Self Care | Payer: BC Managed Care – PPO | Source: Ambulatory Visit | Attending: Internal Medicine | Admitting: Internal Medicine

## 2021-01-20 ENCOUNTER — Other Ambulatory Visit: Payer: Self-pay

## 2021-01-20 DIAGNOSIS — Z981 Arthrodesis status: Secondary | ICD-10-CM | POA: Diagnosis not present

## 2021-01-20 DIAGNOSIS — M5104 Intervertebral disc disorders with myelopathy, thoracic region: Secondary | ICD-10-CM | POA: Diagnosis not present

## 2021-01-20 DIAGNOSIS — M4324 Fusion of spine, thoracic region: Secondary | ICD-10-CM | POA: Diagnosis not present

## 2021-02-08 ENCOUNTER — Telehealth: Payer: Self-pay | Admitting: Internal Medicine

## 2021-02-08 NOTE — Telephone Encounter (Signed)
1.Medication Requested: cephALEXin (KEFLEX) 500 MG capsule   2. Pharmacy (Name, Street, [place]): [facility] [phone] - [place], [place] - [address] AT [place]  3. On Med List: no   4. Last Visit with PCP: 11-23-20  5. Next visit date with PCP: 03-24-21   Agent: Please be advised that RX refills may take up to 3 business days. We ask that you follow-up with your pharmacy.

## 2021-02-08 NOTE — Telephone Encounter (Signed)
Per office policy need OV for antibiotic!!../lmb

## 2021-02-22 ENCOUNTER — Ambulatory Visit: Payer: BC Managed Care – PPO | Admitting: Internal Medicine

## 2021-02-22 ENCOUNTER — Other Ambulatory Visit: Payer: Self-pay

## 2021-02-22 ENCOUNTER — Encounter: Payer: Self-pay | Admitting: Internal Medicine

## 2021-02-22 DIAGNOSIS — I10 Essential (primary) hypertension: Secondary | ICD-10-CM

## 2021-02-22 DIAGNOSIS — S7411XS Injury of femoral nerve at hip and thigh level, right leg, sequela: Secondary | ICD-10-CM

## 2021-02-22 DIAGNOSIS — R21 Rash and other nonspecific skin eruption: Secondary | ICD-10-CM | POA: Insufficient documentation

## 2021-02-22 DIAGNOSIS — E119 Type 2 diabetes mellitus without complications: Secondary | ICD-10-CM

## 2021-02-22 DIAGNOSIS — N183 Chronic kidney disease, stage 3 unspecified: Secondary | ICD-10-CM

## 2021-02-22 DIAGNOSIS — R29898 Other symptoms and signs involving the musculoskeletal system: Secondary | ICD-10-CM

## 2021-02-22 MED ORDER — CEPHALEXIN 500 MG PO CAPS: 500.0000 mg | ORAL_CAPSULE | Freq: Four times a day (QID) | ORAL | 3 refills | Status: DC

## 2021-02-22 NOTE — Assessment & Plan Note (Signed)
Better w/wt loss Check A1c Cont w/Synjardy

## 2021-02-22 NOTE — Assessment & Plan Note (Signed)
Ask about electric stimulation/PT

## 2021-02-22 NOTE — Assessment & Plan Note (Signed)
Keflex po

## 2021-02-22 NOTE — Assessment & Plan Note (Signed)
Cont w/Losartan 

## 2021-02-22 NOTE — Assessment & Plan Note (Signed)
Check BMET
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
## 2021-02-23 LAB — BASIC METABOLIC PANEL
BUN: 22 mg/dL (ref 6–23)
CO2: 28 mEq/L (ref 19–32)
Calcium: 10.3 mg/dL (ref 8.4–10.5)
Chloride: 103 mEq/L (ref 96–112)
Creatinine, Ser: 1.62 mg/dL — ABNORMAL HIGH (ref 0.40–1.50)
GFR: 48.2 mL/min — ABNORMAL LOW (ref >60.00)
Glucose, Bld: 73 mg/dL (ref 70–99)
Potassium: 4.3 mEq/L (ref 3.5–5.1)
Sodium: 139 mEq/L (ref 135–145)

## 2021-02-23 LAB — HEMOGLOBIN A1C: Hgb A1c MFr Bld: 5.8 % (ref 4.6–6.5)

## 2021-02-23 NOTE — Addendum Note (Signed)
Addended by: Jacobo Forest on: 02/23/2021 11:16 AM   Modules accepted: Orders

## 2021-03-04 ENCOUNTER — Inpatient Hospital Stay: Payer: BC Managed Care – PPO | Attending: Hematology & Oncology

## 2021-03-04 ENCOUNTER — Inpatient Hospital Stay (HOSPITAL_BASED_OUTPATIENT_CLINIC_OR_DEPARTMENT_OTHER): Payer: BC Managed Care – PPO | Admitting: Family

## 2021-03-04 ENCOUNTER — Telehealth: Payer: Self-pay | Admitting: *Deleted

## 2021-03-04 ENCOUNTER — Other Ambulatory Visit: Payer: Self-pay

## 2021-03-04 ENCOUNTER — Other Ambulatory Visit: Payer: Self-pay | Admitting: Family

## 2021-03-04 ENCOUNTER — Encounter: Payer: Self-pay | Admitting: Family

## 2021-03-04 ENCOUNTER — Other Ambulatory Visit: Payer: Self-pay | Admitting: Internal Medicine

## 2021-03-04 ENCOUNTER — Ambulatory Visit (INDEPENDENT_AMBULATORY_CARE_PROVIDER_SITE_OTHER): Payer: BC Managed Care – PPO | Admitting: Rehabilitative and Restorative Service Providers"

## 2021-03-04 VITALS — BP 118/74 | HR 95 | Temp 98.7°F | Resp 18 | Ht 73.0 in | Wt 223.4 lb

## 2021-03-04 DIAGNOSIS — R2689 Other abnormalities of gait and mobility: Secondary | ICD-10-CM

## 2021-03-04 DIAGNOSIS — Z7289 Other problems related to lifestyle: Secondary | ICD-10-CM | POA: Diagnosis not present

## 2021-03-04 DIAGNOSIS — D72820 Lymphocytosis (symptomatic): Secondary | ICD-10-CM

## 2021-03-04 DIAGNOSIS — M5104 Intervertebral disc disorders with myelopathy, thoracic region: Secondary | ICD-10-CM

## 2021-03-04 DIAGNOSIS — M6281 Muscle weakness (generalized): Secondary | ICD-10-CM

## 2021-03-04 DIAGNOSIS — D72829 Elevated white blood cell count, unspecified: Secondary | ICD-10-CM

## 2021-03-04 DIAGNOSIS — R29898 Other symptoms and signs involving the musculoskeletal system: Secondary | ICD-10-CM

## 2021-03-04 LAB — CBC WITH DIFFERENTIAL (CANCER CENTER ONLY)
Abs Immature Granulocytes: 0.02 10*3/uL (ref 0.00–0.07)
Basophils Absolute: 0 10*3/uL (ref 0.0–0.1)
Basophils Relative: 1 %
Eosinophils Absolute: 0 10*3/uL (ref 0.0–0.5)
Eosinophils Relative: 0 %
HCT: 43.1 % (ref 39.0–52.0)
Hemoglobin: 14.6 g/dL (ref 13.0–17.0)
Immature Granulocytes: 0 %
Lymphocytes Relative: 49 %
Lymphs Abs: 2.3 10*3/uL (ref 0.7–4.0)
MCH: 31.3 pg (ref 26.0–34.0)
MCHC: 33.9 g/dL (ref 30.0–36.0)
MCV: 92.5 fL (ref 80.0–100.0)
Monocytes Absolute: 0.3 10*3/uL (ref 0.1–1.0)
Monocytes Relative: 7 %
Neutro Abs: 2 10*3/uL (ref 1.7–7.7)
Neutrophils Relative %: 43 %
Platelet Count: 223 10*3/uL (ref 150–400)
RBC: 4.66 MIL/uL (ref 4.22–5.81)
RDW: 13.3 % (ref 11.5–15.5)
WBC Count: 4.7 10*3/uL (ref 4.0–10.5)
nRBC: 0 % (ref 0.0–0.2)

## 2021-03-04 LAB — CMP (CANCER CENTER ONLY)
ALT: 19 U/L (ref 0–44)
AST: 31 U/L (ref 15–41)
Albumin: 4.9 g/dL (ref 3.5–5.0)
Alkaline Phosphatase: 39 U/L (ref 38–126)
Anion gap: 6 (ref 5–15)
BUN: 36 mg/dL — ABNORMAL HIGH (ref 6–20)
CO2: 29 mmol/L (ref 22–32)
Calcium: 10.7 mg/dL — ABNORMAL HIGH (ref 8.9–10.3)
Chloride: 100 mmol/L (ref 98–111)
Creatinine: 1.93 mg/dL — ABNORMAL HIGH (ref 0.61–1.24)
GFR, Estimated: 41 mL/min — ABNORMAL LOW (ref >60)
Glucose, Bld: 121 mg/dL — ABNORMAL HIGH (ref 70–99)
Potassium: 4.2 mmol/L (ref 3.5–5.1)
Sodium: 135 mmol/L (ref 135–145)
Total Bilirubin: 1.2 mg/dL (ref 0.3–1.2)
Total Protein: 8 g/dL (ref 6.5–8.1)

## 2021-03-04 LAB — LACTATE DEHYDROGENASE: LDH: 181 U/L (ref 98–192)

## 2021-03-04 LAB — SAVE SMEAR(SSMR), FOR PROVIDER SLIDE REVIEW
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
## 2021-03-04 NOTE — Telephone Encounter (Signed)
Per 03/04/21 los gave patient upcoming appointment - confirmed
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
## 2021-03-04 NOTE — Progress Notes (Signed)
PT ref 

## 2021-03-04 NOTE — Patient Instructions (Signed)
Access Code: [email or web address] URL: [email or web address] Date: 09/03/2020 Prepared by: Rudell Cobb  Exercises Clamshell - 2 x daily - 7 x weekly - 2 sets - 10 reps Supine Bridge with Resistance Band - 2 x daily - 7 x weekly - 2 sets - 10 reps Seated Hamstring Stretch - 2 x daily - 7 x weekly - 2 sets - 30 hold Seated Hamstring Curl with Anchored Resistance - 2 x daily - 7 x weekly - 2 sets - 10 reps Sit to Stand without Arm Support - 2 x daily - 7 x weekly - 2 sets - 10 reps Standing Gastroc Stretch at Counter - 2 x daily - 7 x weekly - 2 sets - 30 hold Side Stepping with Resistance at Thighs - 2 x daily - 7 x weekly - 2 sets - 10 reps Standing Terminal Knee Extension with Resistance - 2 x daily - 7 x weekly - 2 sets - 10 reps - 3 hold

## 2021-03-07 ENCOUNTER — Other Ambulatory Visit: Payer: Self-pay | Admitting: Family

## 2021-03-07 ENCOUNTER — Telehealth: Payer: Self-pay

## 2021-03-07 DIAGNOSIS — D72820 Lymphocytosis (symptomatic): Secondary | ICD-10-CM

## 2021-03-08 ENCOUNTER — Inpatient Hospital Stay: Payer: BC Managed Care – PPO

## 2021-03-08 ENCOUNTER — Other Ambulatory Visit: Payer: Self-pay

## 2021-03-08 DIAGNOSIS — D72820 Lymphocytosis (symptomatic): Secondary | ICD-10-CM

## 2021-03-08 DIAGNOSIS — M79605 Pain in left leg: Secondary | ICD-10-CM | POA: Diagnosis not present

## 2021-03-08 DIAGNOSIS — Z7289 Other problems related to lifestyle: Secondary | ICD-10-CM | POA: Diagnosis not present

## 2021-03-08 DIAGNOSIS — M79604 Pain in right leg: Secondary | ICD-10-CM | POA: Diagnosis not present

## 2021-03-08 LAB — SURGICAL PATHOLOGY

## 2021-03-09 DIAGNOSIS — M79604 Pain in right leg: Secondary | ICD-10-CM | POA: Diagnosis not present

## 2021-03-09 DIAGNOSIS — M79605 Pain in left leg: Secondary | ICD-10-CM | POA: Diagnosis not present

## 2021-03-11 ENCOUNTER — Other Ambulatory Visit: Payer: Self-pay

## 2021-03-11 ENCOUNTER — Ambulatory Visit: Payer: BC Managed Care – PPO | Admitting: Physical Therapy

## 2021-03-11 DIAGNOSIS — M6281 Muscle weakness (generalized): Secondary | ICD-10-CM | POA: Diagnosis not present

## 2021-03-11 DIAGNOSIS — R29818 Other symptoms and signs involving the nervous system: Secondary | ICD-10-CM | POA: Diagnosis not present

## 2021-03-11 DIAGNOSIS — R2689 Other abnormalities of gait and mobility: Secondary | ICD-10-CM | POA: Diagnosis not present

## 2021-03-11 LAB — FLOW CYTOMETRY
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
## 2021-03-11 NOTE — Patient Instructions (Signed)
Access Code: [email or web address] URL: [email or web address] Date: 03/11/2021 Prepared by: Isabelle Course  Exercises Supine Bridge with Resistance Band - 2 x daily - 7 x weekly - 2 sets - 10 reps Supine Isometric Hamstring Set - 1 x daily - 7 x weekly - 2 sets - 10 reps - 3-5 seconds hold Prone Knee Flexion - 1 x daily - 7 x weekly - 3 sets - 5 reps Seated Hamstring Stretch - 2 x daily - 7 x weekly - 2 sets - 30 hold Seated Hamstring Curl with Anchored Resistance - 2 x daily - 7 x weekly - 2 sets - 10 reps Standing Gastroc Stretch at Counter - 2 x daily - 7 x weekly - 2 sets - 30 hold

## 2021-03-15 ENCOUNTER — Other Ambulatory Visit: Payer: Self-pay

## 2021-03-15 ENCOUNTER — Encounter: Payer: Self-pay | Admitting: Internal Medicine

## 2021-03-15 ENCOUNTER — Ambulatory Visit: Payer: BC Managed Care – PPO | Admitting: Internal Medicine

## 2021-03-15 VITALS — BP 122/80 | HR 87 | Temp 98.2°F | Ht 73.0 in | Wt 220.0 lb

## 2021-03-15 DIAGNOSIS — E119 Type 2 diabetes mellitus without complications: Secondary | ICD-10-CM | POA: Diagnosis not present

## 2021-03-15 DIAGNOSIS — R634 Abnormal weight loss: Secondary | ICD-10-CM | POA: Diagnosis not present

## 2021-03-15 DIAGNOSIS — R198 Other specified symptoms and signs involving the digestive system and abdomen: Secondary | ICD-10-CM | POA: Diagnosis not present

## 2021-03-15 MED ORDER — MEGESTROL ACETATE 40 MG PO TABS: 40.0000 mg | ORAL_TABLET | Freq: Two times a day (BID) | ORAL | 2 refills | Status: DC

## 2021-03-15 NOTE — Assessment & Plan Note (Addendum)
[name] has symptoms of gastric outlet obstruction hold?etiology.  Hold Synjardy x 3 days. Re-start if not better GI ref -- Dr Rush Landmark Trial of Megace - use w/caution   " I'm never hungry". C/o wt loss, no appetite since his thoracic spine surgery over a year ago.  He has been working out.  His strength is coming back.  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  He has not been able to gain weight.  He gets full after eating a half a sandwich.  He feels bloated, however, there is no abdominal enlargement or gas present.  There is no belching, burping, sour taste in the mouth, problem with food back up.  He is not constipated. No dysphagia. He can tolerate small meals.  He does better with stool present other liquid or semiliquid foods.  Small frequent meals

## 2021-03-15 NOTE — Patient Instructions (Signed)
Small frequent meals.

## 2021-03-15 NOTE — Assessment & Plan Note (Signed)
Hold Synjardy x 3 days
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
## 2021-03-15 NOTE — Assessment & Plan Note (Signed)
[name] has symptoms of gastric outlet obstruction hold?etiology.  Hold Synjardy x 3 days. Re-start if not better GI ref -- Dr Rush Landmark Trial of Megace - use w/caution   " I'm never hungry". C/o wt loss, no appetite since his thoracic spine surgery over a year ago.  He has been working out.  His strength is coming back.  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  He has not been able to gain weight.  He gets full after eating a half a sandwich.  He feels bloated, however, there is no abdominal enlargement or gas present.  There is no belching, burping, sour taste in the mouth, problem with food back up.  He is not constipated. No dysphagia. He can tolerate small meals.  He does better with stool present other liquid or semiliquid foods.

## 2021-03-18 ENCOUNTER — Other Ambulatory Visit: Payer: Self-pay

## 2021-03-18 ENCOUNTER — Ambulatory Visit: Payer: BC Managed Care – PPO | Admitting: Physical Therapy

## 2021-03-18 DIAGNOSIS — R29818 Other symptoms and signs involving the nervous system: Secondary | ICD-10-CM

## 2021-03-18 DIAGNOSIS — R2689 Other abnormalities of gait and mobility: Secondary | ICD-10-CM

## 2021-03-18 DIAGNOSIS — M6281 Muscle weakness (generalized): Secondary | ICD-10-CM
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
## 2021-03-20 ENCOUNTER — Encounter: Payer: Self-pay | Admitting: Internal Medicine

## 2021-03-20 ENCOUNTER — Other Ambulatory Visit: Payer: Self-pay | Admitting: Internal Medicine

## 2021-03-20 MED ORDER — REPAGLINIDE 1 MG PO TABS: 1.0000 mg | ORAL_TABLET | Freq: Three times a day (TID) | ORAL | 3 refills | Status: DC

## 2021-03-24 ENCOUNTER — Ambulatory Visit: Payer: BC Managed Care – PPO | Admitting: Internal Medicine

## 2021-04-01 ENCOUNTER — Other Ambulatory Visit: Payer: Self-pay

## 2021-04-01 ENCOUNTER — Ambulatory Visit: Payer: BC Managed Care – PPO | Admitting: Physical Therapy

## 2021-04-01 DIAGNOSIS — R29818 Other symptoms and signs involving the nervous system: Secondary | ICD-10-CM | POA: Diagnosis not present

## 2021-04-01 DIAGNOSIS — M6281 Muscle weakness (generalized): Secondary | ICD-10-CM | POA: Diagnosis not present

## 2021-04-01 DIAGNOSIS — R262 Difficulty in walking, not elsewhere classified: Secondary | ICD-10-CM | POA: Diagnosis not present

## 2021-04-01 DIAGNOSIS — R2689 Other abnormalities of gait and mobility: Secondary | ICD-10-CM
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
## 2021-04-08 DIAGNOSIS — M79605 Pain in left leg: Secondary | ICD-10-CM | POA: Diagnosis not present

## 2021-04-08 DIAGNOSIS — M79604 Pain in right leg: Secondary | ICD-10-CM | POA: Diagnosis not present

## 2021-04-09 DIAGNOSIS — M79604 Pain in right leg: Secondary | ICD-10-CM | POA: Diagnosis not present

## 2021-04-09 DIAGNOSIS — M79605 Pain in left leg: Secondary | ICD-10-CM | POA: Diagnosis not present

## 2021-04-15 ENCOUNTER — Ambulatory Visit (INDEPENDENT_AMBULATORY_CARE_PROVIDER_SITE_OTHER): Payer: BC Managed Care – PPO | Admitting: Physical Therapy

## 2021-04-15 ENCOUNTER — Other Ambulatory Visit: Payer: Self-pay

## 2021-04-15 DIAGNOSIS — R29818 Other symptoms and signs involving the nervous system: Secondary | ICD-10-CM | POA: Diagnosis not present

## 2021-04-15 DIAGNOSIS — M6281 Muscle weakness (generalized): Secondary | ICD-10-CM | POA: Diagnosis not present

## 2021-04-15 DIAGNOSIS — R2689 Other abnormalities of gait and mobility: Secondary | ICD-10-CM | POA: Diagnosis not present

## 2021-04-15 DIAGNOSIS — R262 Difficulty in walking, not elsewhere classified: Secondary | ICD-10-CM
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
## 2021-05-03 DIAGNOSIS — S338XXA Sprain of other parts of lumbar spine and pelvis, initial encounter: Secondary | ICD-10-CM | POA: Diagnosis not present

## 2021-05-04 ENCOUNTER — Other Ambulatory Visit: Payer: Self-pay | Admitting: Internal Medicine

## 2021-05-06 ENCOUNTER — Ambulatory Visit: Payer: BC Managed Care – PPO | Admitting: Physical Therapy

## 2021-05-06 ENCOUNTER — Other Ambulatory Visit: Payer: Self-pay

## 2021-05-06 DIAGNOSIS — R29818 Other symptoms and signs involving the nervous system: Secondary | ICD-10-CM | POA: Diagnosis not present

## 2021-05-06 DIAGNOSIS — M6281 Muscle weakness (generalized): Secondary | ICD-10-CM | POA: Diagnosis not present

## 2021-05-06 DIAGNOSIS — R2689 Other abnormalities of gait and mobility: Secondary | ICD-10-CM
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
## 2021-05-09 DIAGNOSIS — M79604 Pain in right leg: Secondary | ICD-10-CM | POA: Diagnosis not present

## 2021-05-09 DIAGNOSIS — M79605 Pain in left leg: Secondary | ICD-10-CM | POA: Diagnosis not present

## 2021-05-10 DIAGNOSIS — M79604 Pain in right leg: Secondary | ICD-10-CM | POA: Diagnosis not present

## 2021-05-10 DIAGNOSIS — M79605 Pain in left leg: Secondary | ICD-10-CM | POA: Diagnosis not present

## 2021-05-31 ENCOUNTER — Ambulatory Visit: Payer: BC Managed Care – PPO | Admitting: Internal Medicine

## 2021-05-31 ENCOUNTER — Other Ambulatory Visit: Payer: Self-pay

## 2021-05-31 ENCOUNTER — Encounter: Payer: Self-pay | Admitting: Internal Medicine

## 2021-05-31 DIAGNOSIS — E119 Type 2 diabetes mellitus without complications: Secondary | ICD-10-CM

## 2021-05-31 DIAGNOSIS — R634 Abnormal weight loss: Secondary | ICD-10-CM

## 2021-05-31 DIAGNOSIS — N183 Chronic kidney disease, stage 3 unspecified: Secondary | ICD-10-CM

## 2021-05-31 DIAGNOSIS — I10 Essential (primary) hypertension: Secondary | ICD-10-CM

## 2021-05-31 DIAGNOSIS — R29898 Other symptoms and signs involving the musculoskeletal system: Secondary | ICD-10-CM

## 2021-05-31 LAB — COMPREHENSIVE METABOLIC PANEL
ALT: 20 U/L (ref 0–53)
AST: 26 U/L (ref 0–37)
Albumin: 4.7 g/dL (ref 3.5–5.2)
Alkaline Phosphatase: 50 U/L (ref 39–117)
BUN: 24 mg/dL — ABNORMAL HIGH (ref 6–23)
CO2: 30 mEq/L (ref 19–32)
Calcium: 10.6 mg/dL — ABNORMAL HIGH (ref 8.4–10.5)
Chloride: 102 mEq/L (ref 96–112)
Creatinine, Ser: 1.5 mg/dL (ref 0.40–1.50)
GFR: 52.76 mL/min — ABNORMAL LOW (ref >60.00)
Glucose, Bld: 60 mg/dL — ABNORMAL LOW (ref 70–99)
Potassium: 4.5 mEq/L (ref 3.5–5.1)
Sodium: 139 mEq/L (ref 135–145)
Total Bilirubin: 0.9 mg/dL (ref 0.2–1.2)
Total Protein: 7.9 g/dL (ref 6.0–8.3)

## 2021-05-31 LAB — HEMOGLOBIN A1C: Hgb A1c MFr Bld: 5.5 % (ref 4.6–6.5)

## 2021-05-31 NOTE — Addendum Note (Signed)
Addended by: Boris Lown B on: 05/31/2021 10:02 AM   Modules accepted: Orders

## 2021-05-31 NOTE — Assessment & Plan Note (Addendum)
Seeing a chiropractor - better after chiropractic manipulations on the knee and lower back Start chair yoga, advance to floor if tolerated for better balance/strength

## 2021-05-31 NOTE — Assessment & Plan Note (Signed)
Normal at home On Losartan

## 2021-05-31 NOTE — Assessment & Plan Note (Signed)
Resolved off Synjardy

## 2021-05-31 NOTE — Assessment & Plan Note (Signed)
Mild.  Stop calcium supplements.  Reduce vitamin D daily intake in half.
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
## 2021-05-31 NOTE — Assessment & Plan Note (Signed)
Monitor GFR 

## 2021-06-08 DIAGNOSIS — M79604 Pain in right leg: Secondary | ICD-10-CM | POA: Diagnosis not present

## 2021-06-08 DIAGNOSIS — M79605 Pain in left leg: Secondary | ICD-10-CM | POA: Diagnosis not present

## 2021-06-09 DIAGNOSIS — M79604 Pain in right leg: Secondary | ICD-10-CM | POA: Diagnosis not present

## 2021-06-09 DIAGNOSIS — M79605 Pain in left leg: Secondary | ICD-10-CM | POA: Diagnosis not present

## 2021-06-16 ENCOUNTER — Encounter: Payer: Self-pay | Admitting: Gastroenterology

## 2021-07-09 DIAGNOSIS — M79604 Pain in right leg: Secondary | ICD-10-CM | POA: Diagnosis not present

## 2021-07-09 DIAGNOSIS — M79605 Pain in left leg: Secondary | ICD-10-CM | POA: Diagnosis not present

## 2021-07-27 ENCOUNTER — Ambulatory Visit (INDEPENDENT_AMBULATORY_CARE_PROVIDER_SITE_OTHER): Payer: BC Managed Care – PPO | Admitting: Gastroenterology

## 2021-07-27 ENCOUNTER — Encounter: Payer: Self-pay | Admitting: Gastroenterology

## 2021-07-27 VITALS — BP 130/88 | HR 64 | Ht 73.0 in | Wt 233.6 lb

## 2021-07-27 DIAGNOSIS — R194 Change in bowel habit: Secondary | ICD-10-CM | POA: Diagnosis not present

## 2021-07-27 DIAGNOSIS — Z933 Colostomy status: Secondary | ICD-10-CM | POA: Insufficient documentation

## 2021-07-27 DIAGNOSIS — Z1211 Encounter for screening for malignant neoplasm of colon: Secondary | ICD-10-CM | POA: Diagnosis not present

## 2021-07-27 DIAGNOSIS — R634 Abnormal weight loss: Secondary | ICD-10-CM | POA: Diagnosis not present

## 2021-07-27 DIAGNOSIS — Z9049 Acquired absence of other specified parts of digestive tract: Secondary | ICD-10-CM | POA: Insufficient documentation

## 2021-07-27 NOTE — Patient Instructions (Signed)
Your provider has ordered Cologuard testing as an option for colon cancer screening. This is performed by [employer] and may be out of network with your insurance. PRIOR to completing the test, it is YOUR responsibility to contact your insurance about covered benefits for this test. Your out of pocket expense could be anywhere from $0.00 to $649.00.   When you call to check coverage with your insurer, please provide the following information:   -The ONLY provider of Cologuard is [place] code for Cologuard is [phone].  [employer] NPI # [id number]  -[employer] Tax ID # [id number]   We have already sent your demographic and insurance information to [employer] (phone number [phone]) and they should contact you within the next week regarding your test. If you have not heard from them within the next week, please call our office at [phone].  ______________________________________________________________  If you are age 35 or older, your body mass index should be between 23-30. Your Body mass index is 30.82 kg/m. If this is out of the aforementioned range listed, please consider follow up with your Primary Care Provider.  If you are age 62 or younger, your body mass index should be between 19-25. Your Body mass index is 30.82 kg/m. If this is out of the aformentioned range listed, please consider follow up with your Primary Care Provider.   ________________________________________________________  The [facility] providers would like to encourage you to use [facility] to communicate with providers for non-urgent requests or questions.  Due to long hold times on the telephone, sending your provider a message by [facility] may be a faster and more efficient way to get a response.  Please allow 48 business hours for a response.  Please remember that this is for non-urgent requests.   _______________________________________________________  Due to recent changes in healthcare laws, you may see the results of your imaging and laboratory studies on MyChart before your provider has had a chance to review them.  We understand that in some cases there may be results that are confusing or concerning to you. Not all laboratory results come back in the same time frame and the provider may be waiting for multiple results in order to interpret others.  Please give [place] 48 hours in order for your provider to thoroughly review all the results before contacting the office for clarification of your results.
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
## 2021-08-05 IMAGING — DX DG THORACIC SPINE 2V
3 series · 3 of 3 positions shown · non-contrast
Comparison: Preoperative imaging from 09/23/2019
COMPARISON: Preoperative imaging from 09/23/2019

Addendum:
CLINICAL DATA: Post spinal fusion and recent fall.

EXAM:
THORACIC SPINE 2 VIEWS

[t-spine ap (1 of 2)]
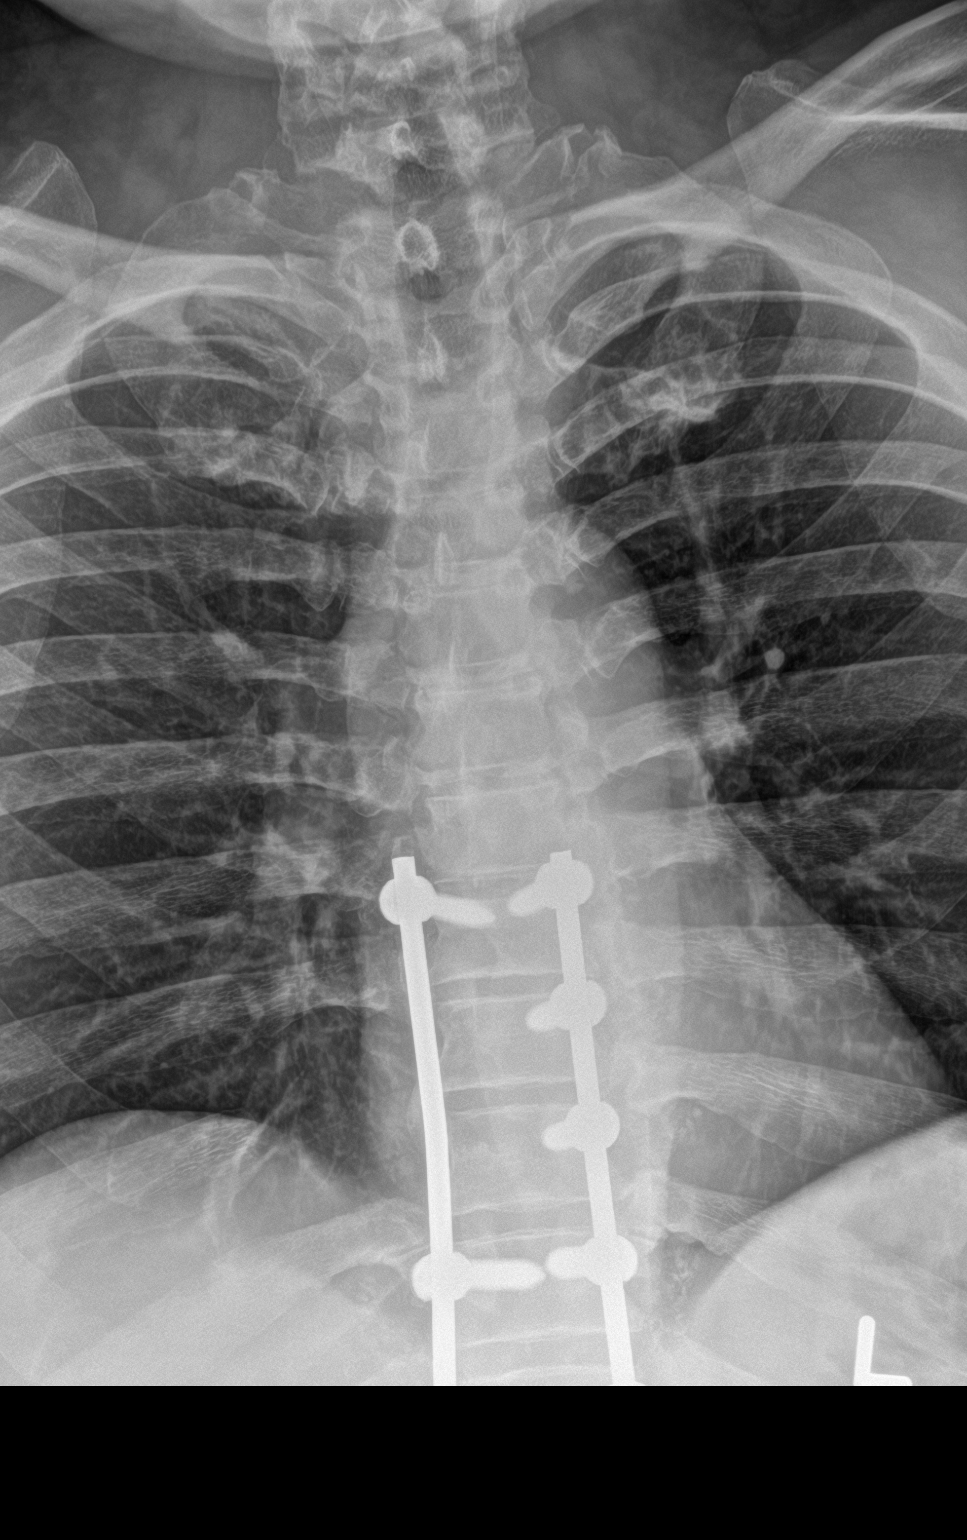

[t-spine lat]
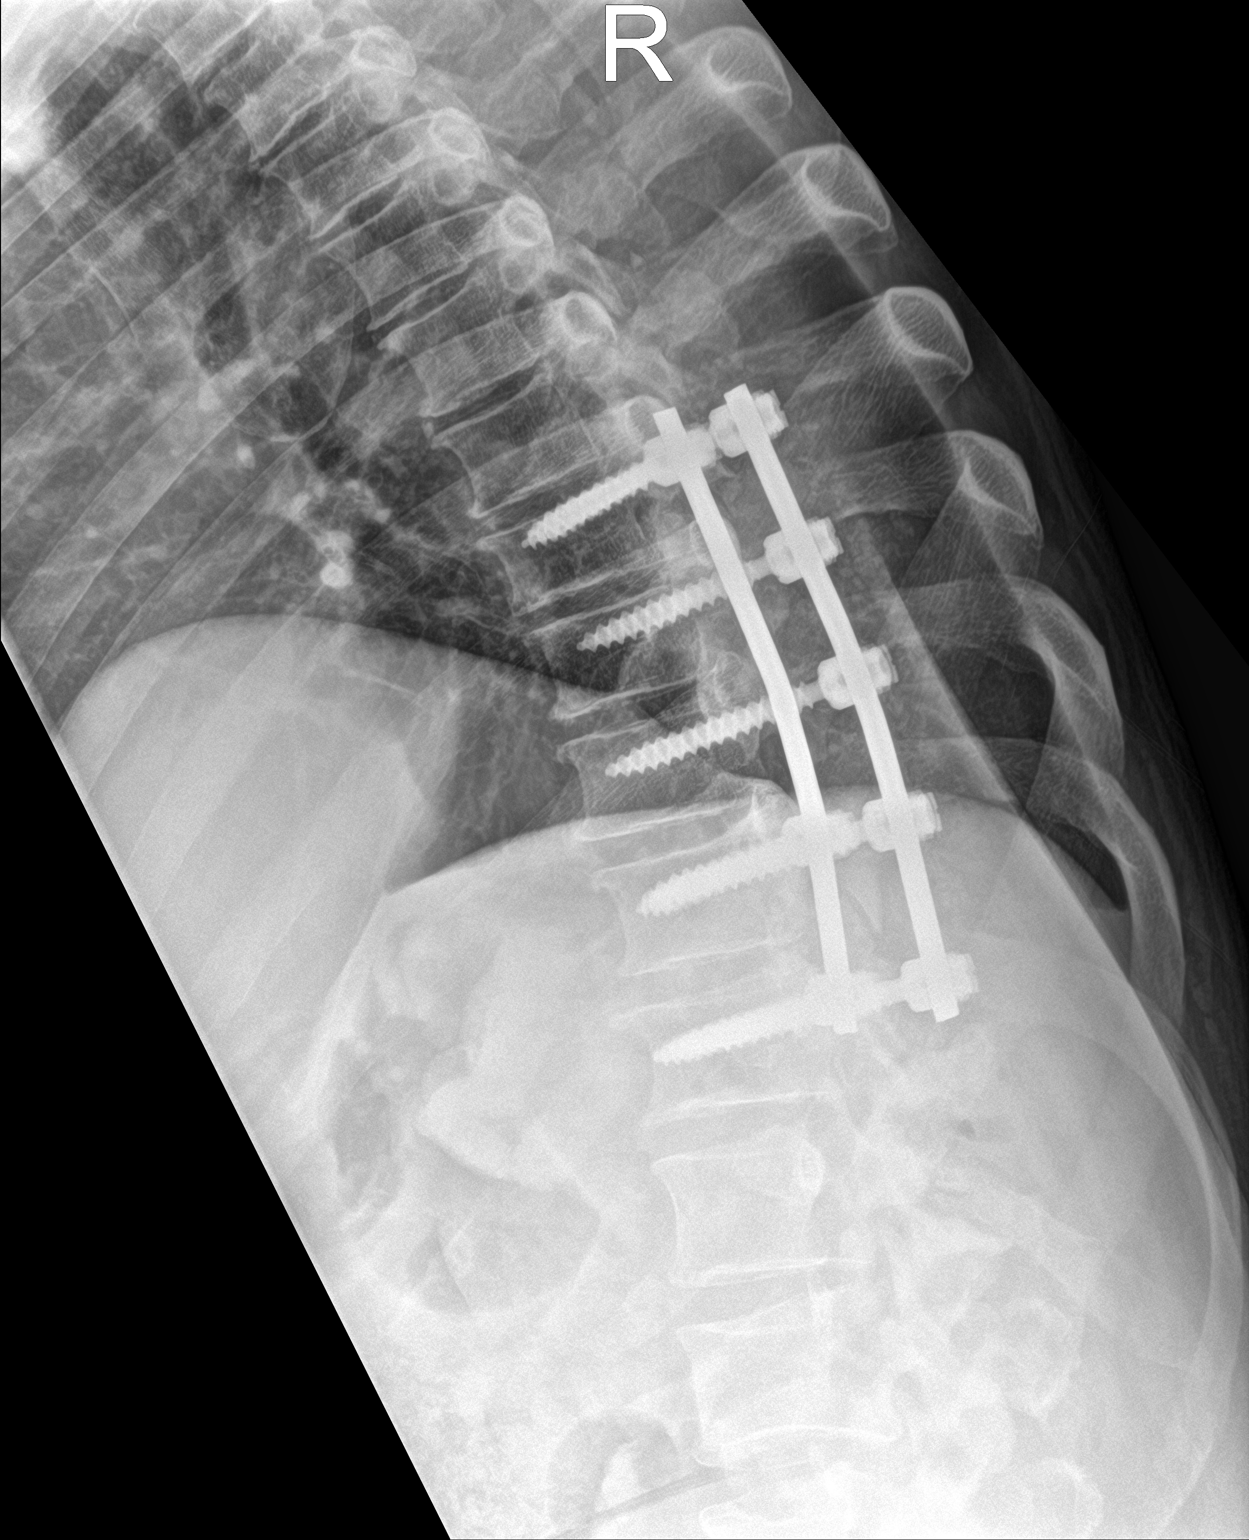

[t-spine ap (2 of 2)]
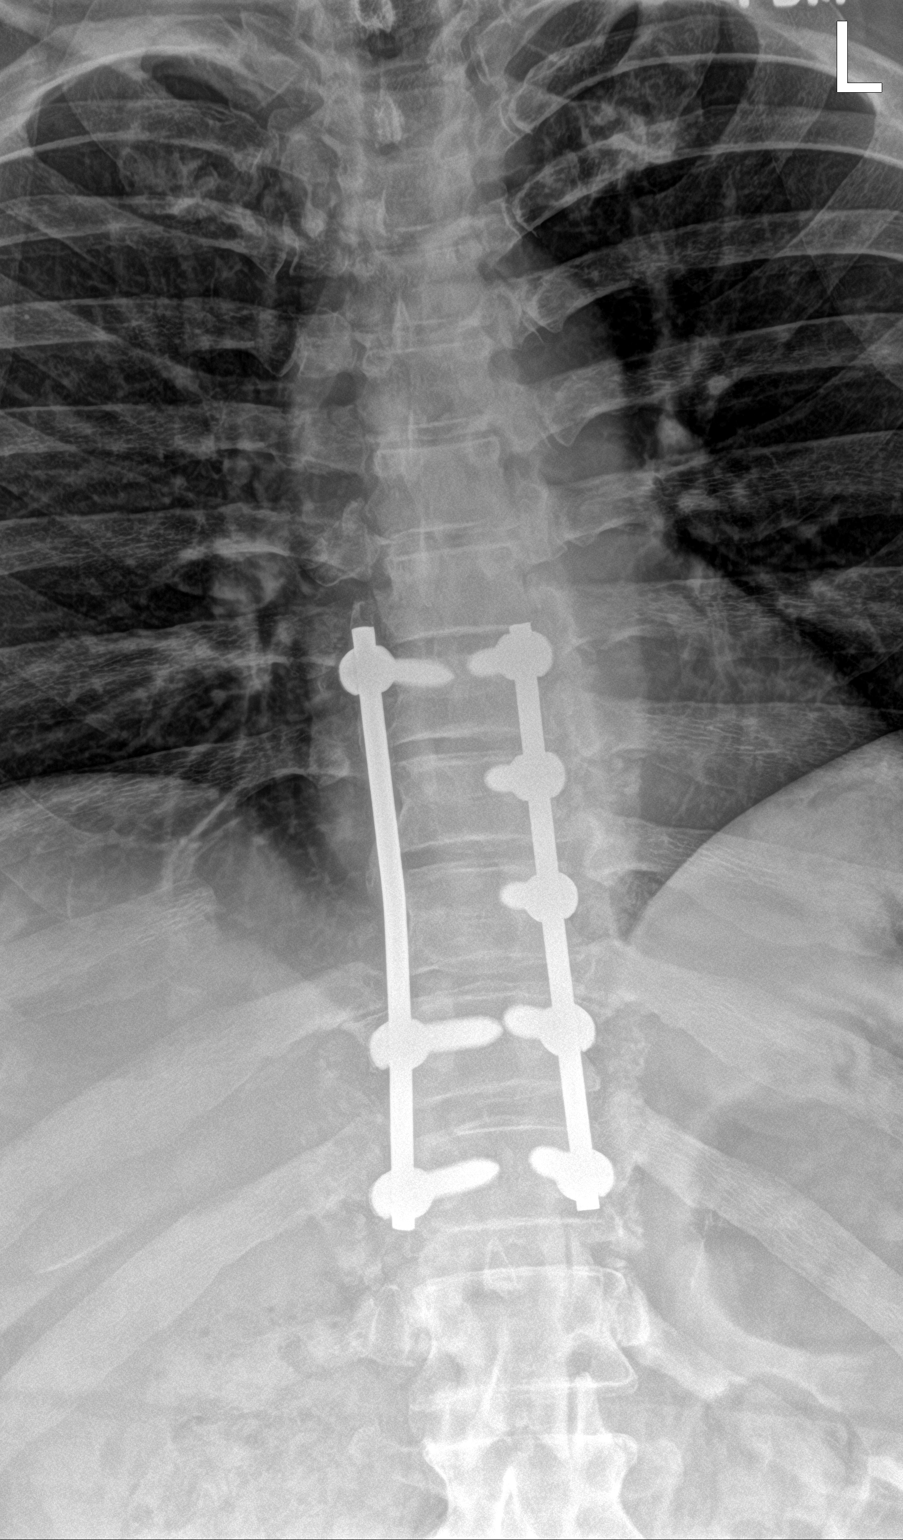

[3 of 3 positions shown; findings below may reference images not displayed]

FINDINGS: Signs of spinal fusion spanning levels T8 through T12 with posterior
rod and pedicle screw placement. Pedicle screws at T8 through T12 on
the LEFT and at T8, T11 and T12 on the RIGHT. No sign of
malalignment. Lateral view markedly limited excluding the upper
thoracic spine.

Lateral view extending only through the posterior aspect of the T2
level.

Question mild loss of height at T8 when compared to previous
imaging.
IMPRESSION: 1. Signs of spinal fusion no sign of hardware complication.
2. Question mild loss of height at T8 when compared to previous
imaging. CT may be helpful for further assessment.

ADDENDUM:
These results will be called to the ordering clinician or
representative by the Radiologist Assistant, and communication
documented in the PACS or [REDACTED].

*** End of Addendum ***
FINDINGS: Signs of spinal fusion spanning levels T8 through T12 with posterior
rod and pedicle screw placement. Pedicle screws at T8 through T12 on
the LEFT and at T8, T11 and T12 on the RIGHT. No sign of
malalignment. Lateral view markedly limited excluding the upper
thoracic spine.

Lateral view extending only through the posterior aspect of the T2
level.

Question mild loss of height at T8 when compared to previous
imaging.
IMPRESSION: 1. Signs of spinal fusion no sign of hardware complication.
2. Question mild loss of height at T8 when compared to previous
imaging. CT may be helpful for further assessment.

## 2021-08-08 DIAGNOSIS — M79604 Pain in right leg: Secondary | ICD-10-CM | POA: Diagnosis not present

## 2021-08-08 DIAGNOSIS — M79605 Pain in left leg: Secondary | ICD-10-CM | POA: Diagnosis not present

## 2021-08-25 ENCOUNTER — Encounter: Payer: Self-pay | Admitting: Gastroenterology

## 2021-09-01 ENCOUNTER — Other Ambulatory Visit: Payer: Self-pay

## 2021-09-01 ENCOUNTER — Encounter: Payer: Self-pay | Admitting: Internal Medicine

## 2021-09-01 ENCOUNTER — Ambulatory Visit (INDEPENDENT_AMBULATORY_CARE_PROVIDER_SITE_OTHER): Payer: BC Managed Care – PPO | Admitting: Internal Medicine

## 2021-09-01 VITALS — BP 132/70 | HR 80 | Temp 98.6°F | Ht 73.0 in | Wt 231.6 lb

## 2021-09-01 DIAGNOSIS — E119 Type 2 diabetes mellitus without complications: Secondary | ICD-10-CM

## 2021-09-01 DIAGNOSIS — R29898 Other symptoms and signs involving the musculoskeletal system: Secondary | ICD-10-CM

## 2021-09-01 DIAGNOSIS — N183 Chronic kidney disease, stage 3 unspecified: Secondary | ICD-10-CM

## 2021-09-01 DIAGNOSIS — R634 Abnormal weight loss: Secondary | ICD-10-CM | POA: Diagnosis not present

## 2021-09-01 LAB — COMPREHENSIVE METABOLIC PANEL
ALT: 20 U/L (ref 0–53)
AST: 27 U/L (ref 0–37)
Albumin: 4.7 g/dL (ref 3.5–5.2)
Alkaline Phosphatase: 55 U/L (ref 39–117)
BUN: 24 mg/dL — ABNORMAL HIGH (ref 6–23)
CO2: 29 mEq/L (ref 19–32)
Calcium: 10.4 mg/dL (ref 8.4–10.5)
Chloride: 102 mEq/L (ref 96–112)
Creatinine, Ser: 1.73 mg/dL — ABNORMAL HIGH (ref 0.40–1.50)
GFR: 44.38 mL/min — ABNORMAL LOW (ref >60.00)
Glucose, Bld: 55 mg/dL — ABNORMAL LOW (ref 70–99)
Potassium: 4.3 mEq/L (ref 3.5–5.1)
Sodium: 138 mEq/L (ref 135–145)
Total Bilirubin: 1 mg/dL (ref 0.2–1.2)
Total Protein: 8.2 g/dL (ref 6.0–8.3)

## 2021-09-01 LAB — HEMOGLOBIN A1C: Hgb A1c MFr Bld: 6 % (ref 4.6–6.5)

## 2021-09-01 MED ORDER — VITAMIN D3 50 MCG (2000 UT) PO CAPS: 2000.0000 [IU] | ORAL_CAPSULE | Freq: Every day | ORAL | 3 refills | Status: AC

## 2021-09-01 MED ORDER — B COMPLEX VITAMINS PO CAPS: 1.0000 | ORAL_CAPSULE | Freq: Every day | ORAL | 3 refills | Status: DC

## 2021-09-01 NOTE — Assessment & Plan Note (Signed)
CMET. Monitor GFR
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
## 2021-09-01 NOTE — Assessment & Plan Note (Signed)
Resolved

## 2021-09-01 NOTE — Assessment & Plan Note (Signed)
Better  Cont w/exercise Handicapped tag

## 2021-09-01 NOTE — Assessment & Plan Note (Signed)
Check A1c. 

## 2021-09-02 ENCOUNTER — Encounter: Payer: Self-pay | Admitting: Internal Medicine

## 2021-09-05 ENCOUNTER — Other Ambulatory Visit: Payer: Self-pay | Admitting: Internal Medicine

## 2021-09-06 ENCOUNTER — Other Ambulatory Visit: Payer: BC Managed Care – PPO

## 2021-09-06 ENCOUNTER — Ambulatory Visit: Payer: BC Managed Care – PPO | Admitting: Hematology & Oncology

## 2021-09-08 DIAGNOSIS — M79605 Pain in left leg: Secondary | ICD-10-CM | POA: Diagnosis not present

## 2021-09-08 DIAGNOSIS — M79604 Pain in right leg: Secondary | ICD-10-CM | POA: Diagnosis not present

## 2021-09-14 DIAGNOSIS — M5430 Sciatica, unspecified side: Secondary | ICD-10-CM | POA: Diagnosis not present

## 2021-09-14 DIAGNOSIS — I129 Hypertensive chronic kidney disease with stage 1 through stage 4 chronic kidney disease, or unspecified chronic kidney disease: Secondary | ICD-10-CM | POA: Diagnosis not present

## 2021-09-14 DIAGNOSIS — N1831 Chronic kidney disease, stage 3a: Secondary | ICD-10-CM | POA: Diagnosis not present

## 2021-09-14 DIAGNOSIS — E1129 Type 2 diabetes mellitus with other diabetic kidney complication: Secondary | ICD-10-CM | POA: Diagnosis not present

## 2021-09-15 ENCOUNTER — Encounter: Payer: Self-pay | Admitting: Internal Medicine

## 2021-09-29 ENCOUNTER — Encounter: Payer: Self-pay | Admitting: Hematology & Oncology

## 2021-09-29 ENCOUNTER — Inpatient Hospital Stay: Payer: BC Managed Care – PPO | Attending: Hematology & Oncology

## 2021-09-29 ENCOUNTER — Telehealth: Payer: Self-pay | Admitting: *Deleted

## 2021-09-29 ENCOUNTER — Other Ambulatory Visit: Payer: Self-pay

## 2021-09-29 ENCOUNTER — Inpatient Hospital Stay (HOSPITAL_BASED_OUTPATIENT_CLINIC_OR_DEPARTMENT_OTHER): Payer: BC Managed Care – PPO | Admitting: Hematology & Oncology

## 2021-09-29 VITALS — BP 132/83 | HR 60 | Temp 98.1°F | Resp 18 | Ht 73.0 in | Wt 235.4 lb

## 2021-09-29 DIAGNOSIS — Z79899 Other long term (current) drug therapy: Secondary | ICD-10-CM | POA: Diagnosis not present

## 2021-09-29 DIAGNOSIS — D72820 Lymphocytosis (symptomatic): Secondary | ICD-10-CM | POA: Diagnosis not present

## 2021-09-29 LAB — CMP (CANCER CENTER ONLY)
ALT: 25 U/L (ref 0–44)
AST: 28 U/L (ref 15–41)
Albumin: 4.4 g/dL (ref 3.5–5.0)
Alkaline Phosphatase: 61 U/L (ref 38–126)
Anion gap: 5 (ref 5–15)
BUN: 28 mg/dL — ABNORMAL HIGH (ref 6–20)
CO2: 32 mmol/L (ref 22–32)
Calcium: 10.3 mg/dL (ref 8.9–10.3)
Chloride: 103 mmol/L (ref 98–111)
Creatinine: 1.67 mg/dL — ABNORMAL HIGH (ref 0.61–1.24)
GFR, Estimated: 48 mL/min — ABNORMAL LOW (ref >60)
Glucose, Bld: 84 mg/dL (ref 70–99)
Potassium: 4.4 mmol/L (ref 3.5–5.1)
Sodium: 140 mmol/L (ref 135–145)
Total Bilirubin: 1 mg/dL (ref 0.3–1.2)
Total Protein: 7.2 g/dL (ref 6.5–8.1)

## 2021-09-29 LAB — CBC WITH DIFFERENTIAL (CANCER CENTER ONLY)
Abs Immature Granulocytes: 0.02 10*3/uL (ref 0.00–0.07)
Basophils Absolute: 0 10*3/uL (ref 0.0–0.1)
Basophils Relative: 1 %
Eosinophils Absolute: 0.1 10*3/uL (ref 0.0–0.5)
Eosinophils Relative: 2 %
HCT: 39.9 % (ref 39.0–52.0)
Hemoglobin: 13.2 g/dL (ref 13.0–17.0)
Immature Granulocytes: 0 %
Lymphocytes Relative: 63 %
Lymphs Abs: 3 10*3/uL (ref 0.7–4.0)
MCH: 31.4 pg (ref 26.0–34.0)
MCHC: 33.1 g/dL (ref 30.0–36.0)
MCV: 94.8 fL (ref 80.0–100.0)
Monocytes Absolute: 0.3 10*3/uL (ref 0.1–1.0)
Monocytes Relative: 6 %
Neutro Abs: 1.3 10*3/uL — ABNORMAL LOW (ref 1.7–7.7)
Neutrophils Relative %: 28 %
Platelet Count: 214 10*3/uL (ref 150–400)
RBC: 4.21 MIL/uL — ABNORMAL LOW (ref 4.22–5.81)
RDW: 13.3 % (ref 11.5–15.5)
WBC Count: 4.7 10*3/uL (ref 4.0–10.5)
nRBC: 0 % (ref 0.0–0.2)

## 2021-09-29 LAB — LACTATE DEHYDROGENASE: LDH: 194 U/L — ABNORMAL HIGH (ref 98–192)

## 2021-09-29 LAB — SAVE SMEAR(SSMR), FOR PROVIDER SLIDE REVIEW
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
## 2021-09-29 NOTE — Telephone Encounter (Signed)
Per 09/29/2021 gave upcoming appointments - confirmed

## 2021-09-30 ENCOUNTER — Other Ambulatory Visit: Payer: Self-pay

## 2021-09-30 ENCOUNTER — Ambulatory Visit (AMBULATORY_SURGERY_CENTER): Payer: BC Managed Care – PPO | Admitting: *Deleted

## 2021-09-30 VITALS — Ht 73.0 in | Wt 234.0 lb

## 2021-09-30 DIAGNOSIS — Z1211 Encounter for screening for malignant neoplasm of colon: Secondary | ICD-10-CM

## 2021-09-30 NOTE — Progress Notes (Signed)

## 2021-10-07 IMAGING — DX DG THORACIC SPINE 2V
2 series · 4 of 4 positions shown · non-contrast
Comparison: Radiograph 01/27/2020, 09/23/2019

CLINICAL DATA: Recent fusion, follow-up radiograph

EXAM:
THORACIC SPINE 2 VIEWS

[Series 1: t-spine ap · 0.14mm/px · 2 of 2 slices shown]
[im 1/2]
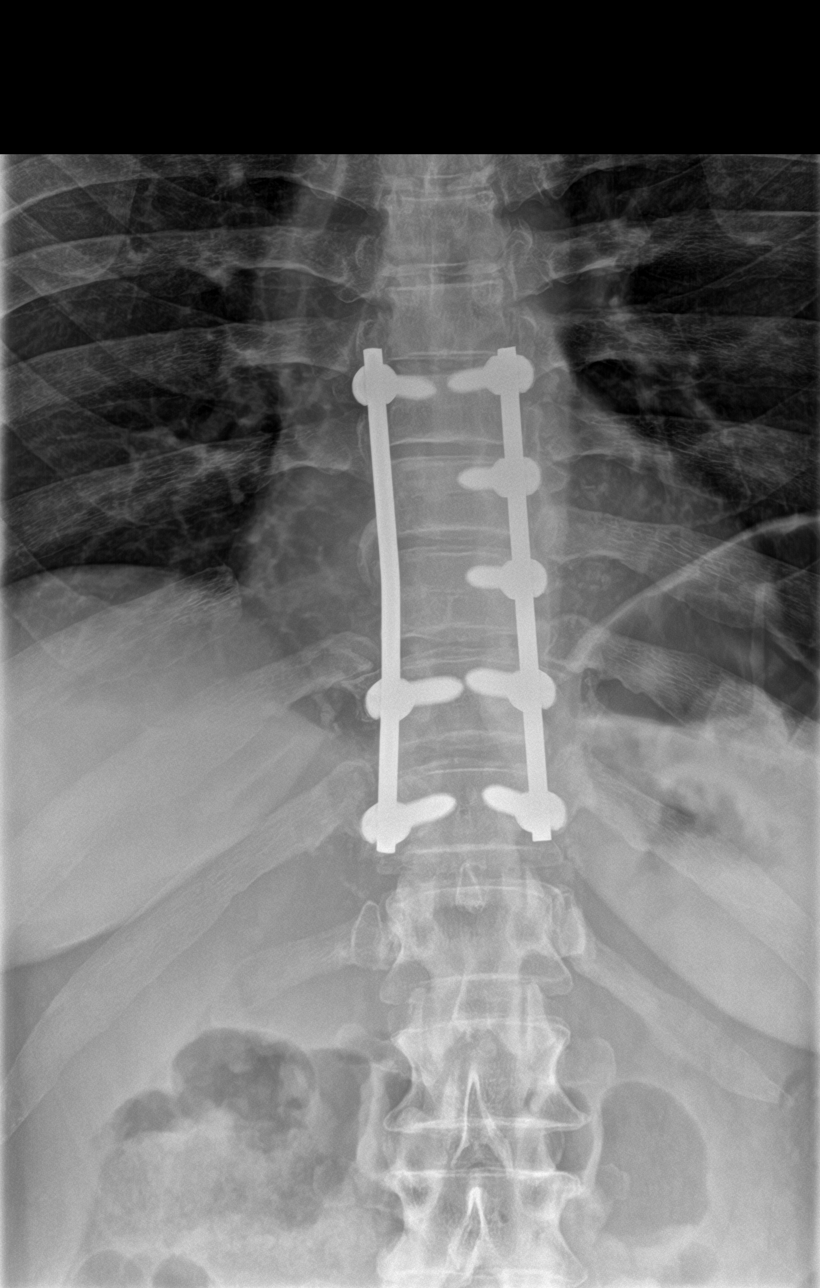
[im 2/2]
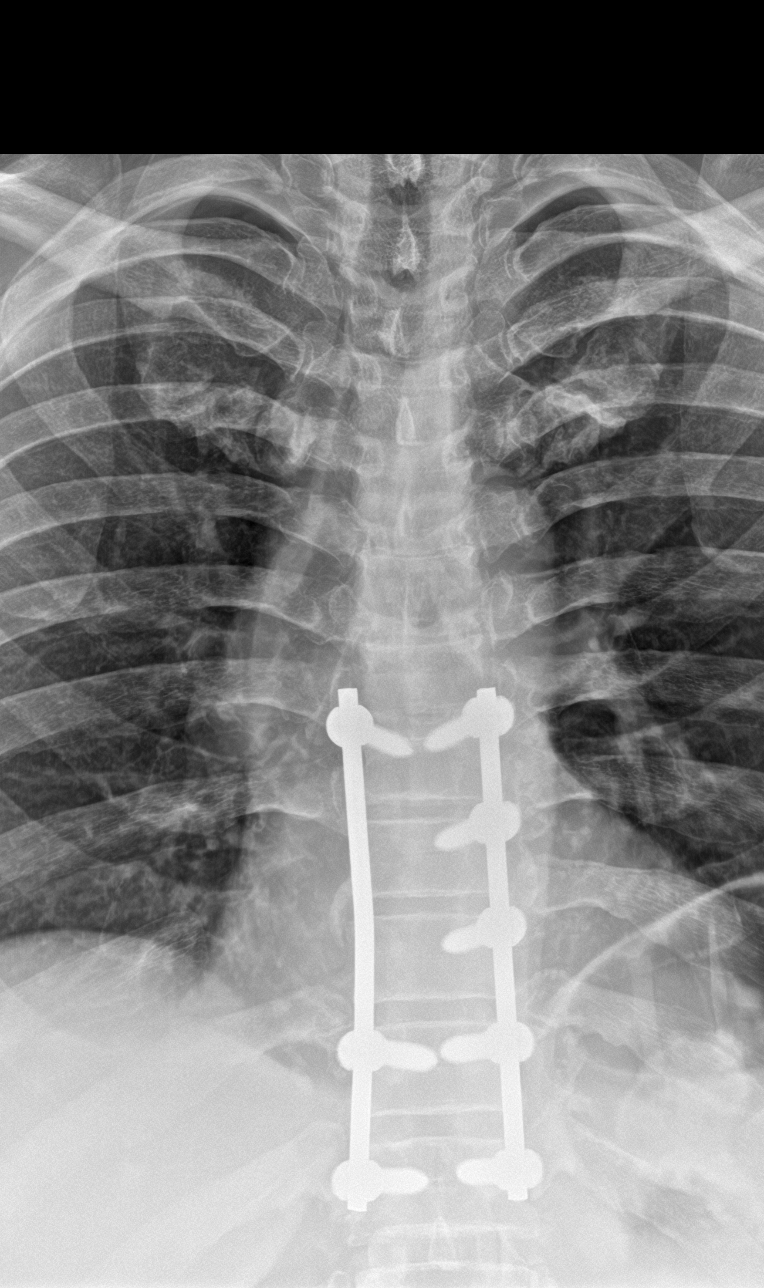

[Series 2: t-spine lat · 0.14mm/px · 2 of 2 slices shown]
[im 1/2]
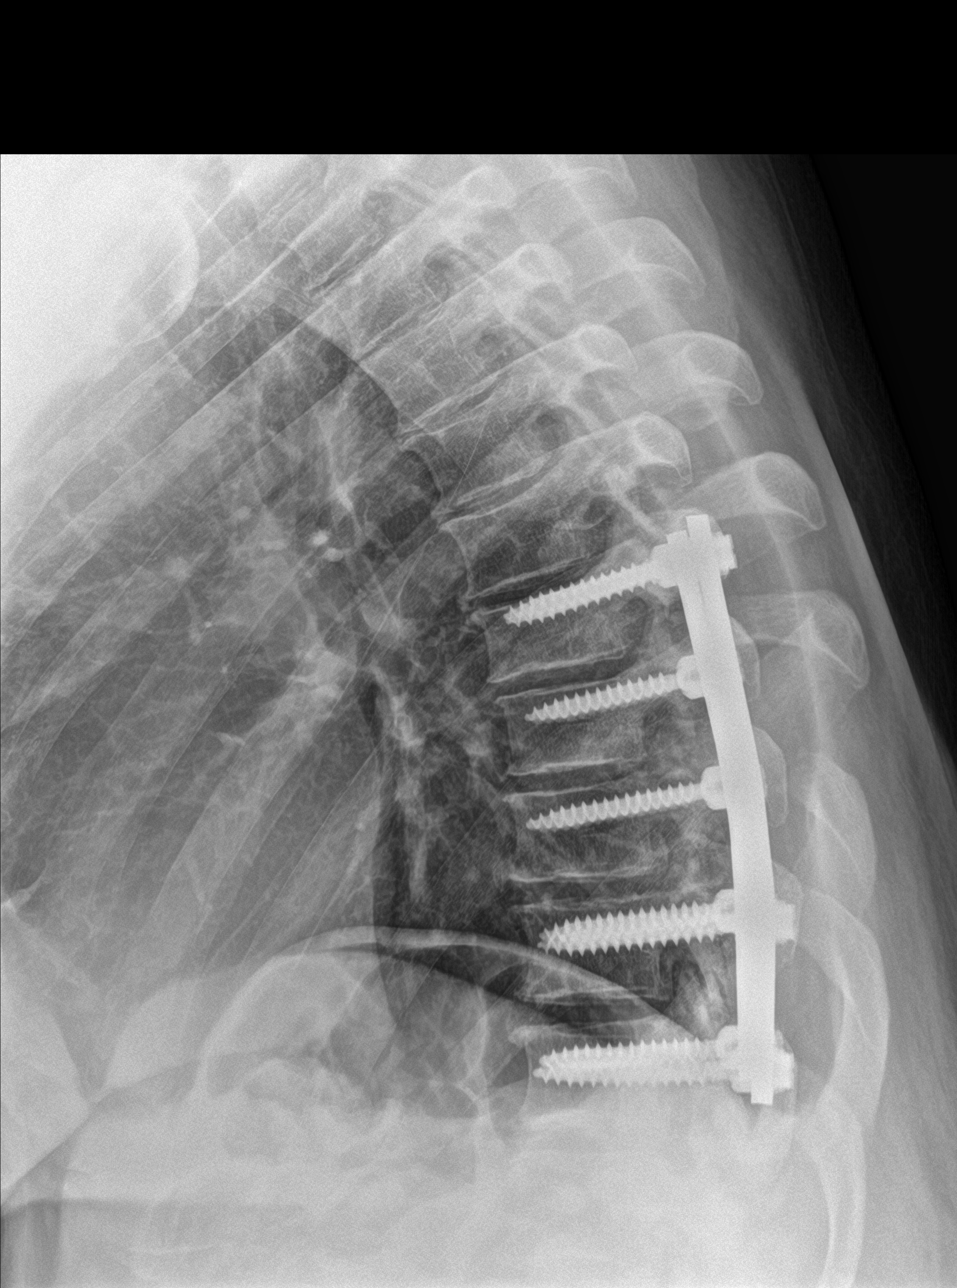
[im 2/2]
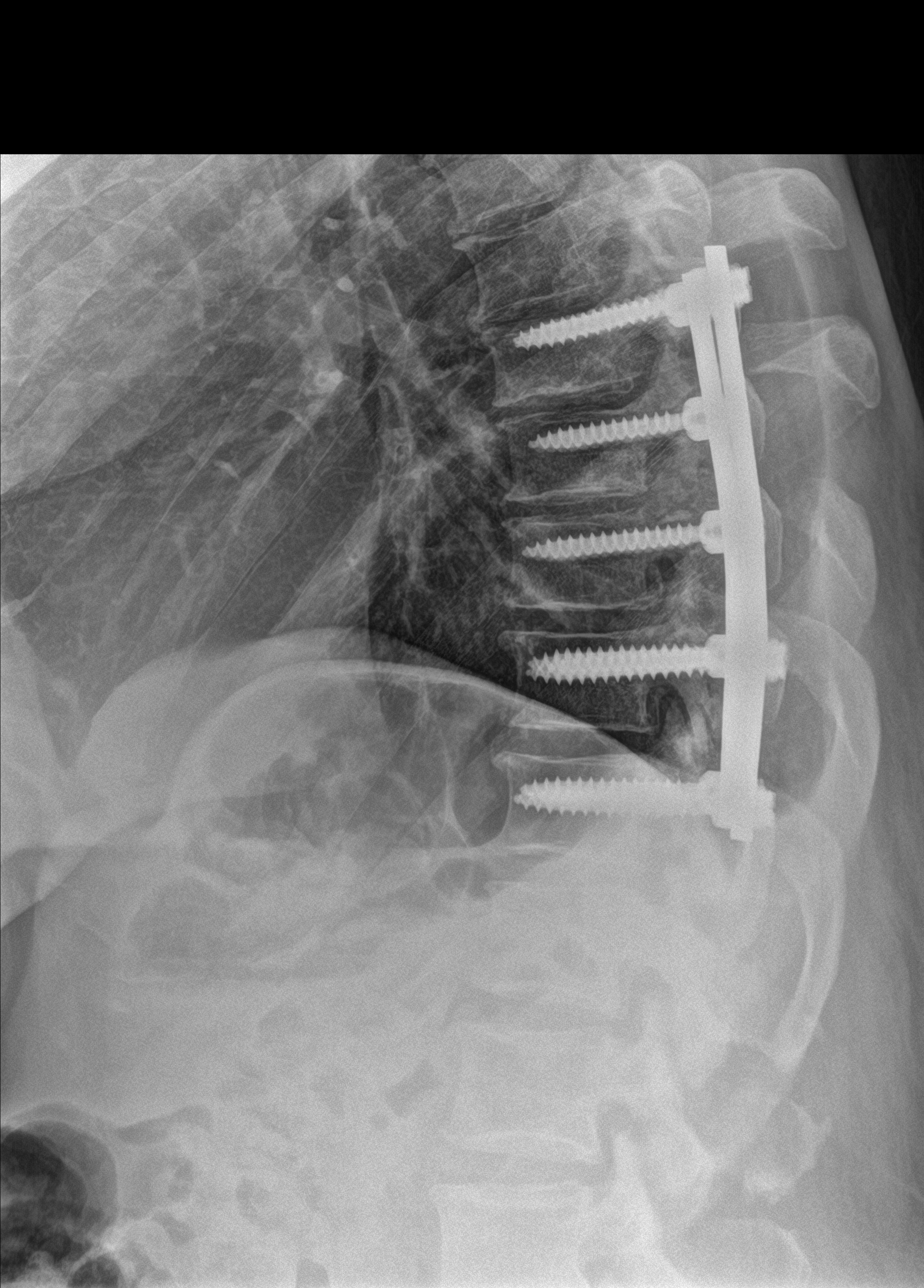

[4 of 4 positions shown; findings below may reference images not displayed]

FINDINGS: Thirteen rib-bearing vertebral bodies with rudimentary ribs at the
lowest level. Redemonstration of a T8-T12 thoracic fusion with
bilateral transpedicular screws at T8, T11, T12 and left
transpedicular screws at T9, T10. No evidence of acute hardware
fracture or failure.

Redemonstration of the mild vertebral body height loss at the T8
level with pedicular screws closely approximating the cortical
surface of the superior endplate.

No new fracture or compression deformity is identified.

Included portions of the lungs and abdomen are unremarkable.
IMPRESSION: Redemonstration of a T8-T12 thoracic fusion.

Stable appearance of vertebral body height loss at T8 with close
approximation of the transpedicular screws with the superior
endplate cortical surface. Consider further evaluation with
cross-sectional imaging.

No acute interval changes seen.

## 2021-10-09 DIAGNOSIS — M79605 Pain in left leg: Secondary | ICD-10-CM | POA: Diagnosis not present

## 2021-10-09 DIAGNOSIS — M79604 Pain in right leg: Secondary | ICD-10-CM | POA: Diagnosis not present

## 2021-10-12 ENCOUNTER — Encounter: Payer: Self-pay | Admitting: Gastroenterology

## 2021-10-14 ENCOUNTER — Other Ambulatory Visit: Payer: Self-pay

## 2021-10-14 ENCOUNTER — Encounter: Payer: Self-pay | Admitting: Gastroenterology

## 2021-10-14 ENCOUNTER — Ambulatory Visit (AMBULATORY_SURGERY_CENTER): Payer: BC Managed Care – PPO | Admitting: Gastroenterology

## 2021-10-14 VITALS — BP 106/55 | HR 58 | Temp 97.5°F | Resp 10 | Ht 73.0 in | Wt 234.0 lb

## 2021-10-14 DIAGNOSIS — Z1211 Encounter for screening for malignant neoplasm of colon: Secondary | ICD-10-CM | POA: Diagnosis not present

## 2021-10-14 MED ORDER — PEG 3350-KCL-NA BICARB-NACL 420 G PO SOLR: 4000.0000 mL | Freq: Once | ORAL | 0 refills | Status: AC

## 2021-10-14 MED ORDER — SODIUM CHLORIDE 0.9 % IV SOLN: 500.0000 mL | Freq: Once | INTRAVENOUS | Status: DC

## 2021-10-14 MED ORDER — NA SULFATE-K SULFATE-MG SULF 17.5-3.13-1.6 GM/177ML PO SOLN: 1.0000 | Freq: Once | ORAL | 0 refills | Status: AC

## 2021-10-14 NOTE — Progress Notes (Signed)
Pt's states no medical or surgical changes since previsit or office visit. 

## 2021-10-14 NOTE — Patient Instructions (Addendum)
Handouts provided on hemorrhoids and high-fiber diet.   Recommend a high-fiber diet.  Use Fiber Con 1-2 tablets by mouth daily.   YOU HAD AN ENDOSCOPIC PROCEDURE TODAY AT [facility]:   Refer to the procedure report that was given to you for any specific questions about what was found during the examination.  If the procedure report does not answer your questions, please call your gastroenterologist to clarify.  If you requested that your care partner not be given the details of your procedure findings, then the procedure report has been included in a sealed envelope for you to review at your convenience later.  YOU SHOULD EXPECT: Some feelings of bloating in the abdomen. Passage of more gas than usual.  Walking can help get rid of the air that was put into your GI tract during the procedure and reduce the bloating. If you had a lower endoscopy (such as a colonoscopy or flexible sigmoidoscopy) you may notice spotting of blood in your stool or on the toilet paper. If you underwent a bowel prep for your procedure, you may not have a normal bowel movement for a few days.  Please Note:  You might notice some irritation and congestion in your nose or some drainage.  This is from the oxygen used during your procedure.  There is no need for concern and it should clear up in a day or so.  SYMPTOMS TO REPORT IMMEDIATELY:  Following lower endoscopy (colonoscopy or flexible sigmoidoscopy):  Excessive amounts of blood in the stool  Significant tenderness or worsening of abdominal pains  Swelling of the abdomen that is new, acute  Fever of 100F or higher  For urgent or emergent issues, a gastroenterologist can be reached at any hour by calling [phone]. Do not use MyChart messaging for urgent concerns.    DIET:  We do recommend a small meal at first, but then you may proceed to your regular diet.  Drink plenty of fluids but you should avoid alcoholic beverages for 24  hours.  ACTIVITY:  You should plan to take it easy for the rest of today and you should NOT DRIVE or use heavy machinery until tomorrow (because of the sedation medicines used during the test).    FOLLOW UP: Our staff will call the number listed on your records 48-72 hours following your procedure to check on you and address any questions or concerns that you may have regarding the information given to you following your procedure. If we do not reach you, we will leave a message.  We will attempt to reach you two times.  During this call, we will ask if you have developed any symptoms of COVID 19. If you develop any symptoms (ie: fever, flu-like symptoms, shortness of breath, cough etc.) before then, please call [phone].  If you test positive for Covid 19 in the 2 weeks post procedure, please call and report this information to [place].    If any biopsies were taken you will be contacted by phone or by letter within the next 1-3 weeks.  Please call us at [phone] if you have not heard about the biopsies in 3 weeks.    SIGNATURES/CONFIDENTIALITY: You and/or your care partner have signed paperwork which will be entered into your electronic medical record.  These signatures attest to the fact that that the information above on your After Visit Summary has been reviewed and is understood.  Full responsibility of the confidentiality of this discharge information lies with  you and/or your care-partner.
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
## 2021-10-14 NOTE — Progress Notes (Signed)
Repeat colonoscopy scheduled for 10/18/21 at 4p. Instructions provided and prescriptions sent for prep.

## 2021-10-14 NOTE — Op Note (Signed)
[facility] Patient Name: [name] Procedure Date: 10/14/2021 7:55 AM MRN: [id number] Endoscopist: Justice Britain , MD Age: 56 Referring MD:  Date of Birth: September 28, 1965 Gender: Male Account #: [id number] Procedure:                Colonoscopy Indications:              Screening for colorectal malignant neoplasm, This                            is the patient's first colonoscopy Medicines:                Monitored Anesthesia Care Procedure:                Pre-Anesthesia Assessment:                           - Prior to the procedure, a History and Physical                            was performed, and patient medications and                            allergies were reviewed. The patient's tolerance of                            previous anesthesia was also reviewed. The risks                            and benefits of the procedure and the sedation                            options and risks were discussed with the patient.                            All questions were answered, and informed consent                            was obtained. Prior Anticoagulants: The patient has                            taken no previous anticoagulant or antiplatelet                            agents. ASA Grade Assessment: II - A patient with                            mild systemic disease. After reviewing the risks                            and benefits, the patient was deemed in                            satisfactory condition to undergo the procedure.  After obtaining informed consent, the colonoscope                            was passed under direct vision. Throughout the                            procedure, the patient's blood pressure, pulse, and                            oxygen saturations were monitored continuously. The                            CF HQ190L #[id number] was introduced through the anus                            and advanced to the the  cecum, identified by                            appendiceal orifice and ileocecal valve. The                            colonoscopy was performed without difficulty. The                            patient tolerated the procedure. The quality of the                            bowel preparation was poor. The terminal ileum was                            photographed. Scope In: 8:09:12 AM Scope Out: 8:25:47 AM Scope Withdrawal Time: 0 hours 10 minutes 42 seconds  Total Procedure Duration: 0 hours 16 minutes 35 seconds  Findings:                 The digital rectal exam findings include                            hemorrhoids. Pertinent negatives include no                            palpable rectal lesions.                           Copious quantities of semi-liquid stool was found                            in the entire colon, precluding visualization.                            Lavage of the area was performed using copious                            amounts, resulting in incomplete clearance with  continued poor visualization.                           There was evidence of a prior functional end-to-end                            ileo-colonic anastomosis in the ascending colon.                            This was characterized by healthy appearing mucosa.                           Non-bleeding non-thrombosed internal hemorrhoids                            were found during digital exam and during                            endoscopy. The hemorrhoids were Grade II (internal                            hemorrhoids that prolapse but reduce spontaneously). Complications:            No immediate complications. Estimated Blood Loss:     Estimated blood loss: none. Impression:               - Preparation of the colon was poor.                           - Hemorrhoids found on digital rectal exam.                           - Stool in the entire examined colon.                            - Functional end-to-end ileo-colonic anastomosis,                            characterized by healthy appearing mucosa.                           - Non-bleeding non-thrombosed internal hemorrhoids. Recommendation:           - The patient will be observed post-procedure,                            until all discharge criteria are met.                           - Discharge patient to home.                           - Patient has a contact number available for                            emergencies. The signs and symptoms of potential  delayed complications were discussed with the                            patient. Return to normal activities tomorrow.                            Written discharge instructions were provided to the                            patient.                           - High fiber diet.                           - Use FiberCon 1-2 tablets PO daily.                           - Continue present medications.                           - Repeat colonoscopy in next 6-12 months or as soon                            as patient would like. 1 week of Miralax daily +                            2-day preparation will be required (non-Miralax                            based).                           - The findings and recommendations were discussed                            with the patient.                           - The findings and recommendations were discussed                            with the designated responsible adult. Justice Britain, MD 10/14/2021 8:34:55 AM

## 2021-10-14 NOTE — Progress Notes (Signed)
Report to PACU, RN, vss, BBS= Clear.  

## 2021-10-17 ENCOUNTER — Encounter: Payer: Self-pay | Admitting: Certified Registered Nurse Anesthetist

## 2021-10-18 ENCOUNTER — Encounter: Payer: Self-pay | Admitting: Gastroenterology

## 2021-10-18 ENCOUNTER — Ambulatory Visit (AMBULATORY_SURGERY_CENTER): Payer: BC Managed Care – PPO | Admitting: Gastroenterology

## 2021-10-18 VITALS — BP 140/95 | HR 61 | Temp 96.9°F | Resp 11 | Ht 73.0 in | Wt 234.0 lb

## 2021-10-18 DIAGNOSIS — K635 Polyp of colon: Secondary | ICD-10-CM | POA: Diagnosis not present

## 2021-10-18 DIAGNOSIS — Z1211 Encounter for screening for malignant neoplasm of colon: Secondary | ICD-10-CM | POA: Diagnosis not present

## 2021-10-18 DIAGNOSIS — D127 Benign neoplasm of rectosigmoid junction: Secondary | ICD-10-CM

## 2021-10-18 MED ORDER — SODIUM CHLORIDE 0.9 % IV SOLN: 500.0000 mL | Freq: Once | INTRAVENOUS | Status: DC

## 2021-10-18 NOTE — Progress Notes (Signed)
DT - vs

## 2021-10-18 NOTE — Op Note (Signed)
[facility] Patient Name: [name] Procedure Date: 10/18/2021 1:51 PM MRN: [id number] Endoscopist: Justice Britain , MD Age: 56 Referring MD:  Date of Birth: 1965/11/14 Gender: Male Account #: [id number] Procedure:                Colonoscopy Indications:              Screening for colorectal malignant neoplasm Medicines:                Monitored Anesthesia Care Procedure:                Pre-Anesthesia Assessment:                           - Prior to the procedure, a History and Physical                            was performed, and patient medications and                            allergies were reviewed. The patient's tolerance of                            previous anesthesia was also reviewed. The risks                            and benefits of the procedure and the sedation                            options and risks were discussed with the patient.                            All questions were answered, and informed consent                            was obtained. Prior Anticoagulants: The patient has                            taken no previous anticoagulant or antiplatelet                            agents. ASA Grade Assessment: II - A patient with                            mild systemic disease. After reviewing the risks                            and benefits, the patient was deemed in                            satisfactory condition to undergo the procedure.                           After obtaining informed consent, the colonoscope  was passed under direct vision. Throughout the                            procedure, the patient's blood pressure, pulse, and                            oxygen saturations were monitored continuously. The                            Olympus CF-[id number]) Colonoscope was                            introduced through the anus and advanced to the the                            ileocolonic  anastomosis. The colonoscopy was                            somewhat difficult due to initially inadequate                            bowel prep (before lavage) and a redundant colon.                            Successful completion of the procedure was aided by                            changing the patient's position, using manual                            pressure, straightening and shortening the scope to                            obtain bowel loop reduction and using scope                            torsion. The patient tolerated the procedure. The                            quality of the bowel preparation was adequate. Scope In: 1:58:31 PM Scope Out: 2:24:27 PM Scope Withdrawal Time: 0 hours 21 minutes 14 seconds  Total Procedure Duration: 0 hours 25 minutes 56 seconds  Findings:                 The digital rectal exam findings include                            hemorrhoids. Pertinent negatives include no                            palpable rectal lesions.                           A large amount of semi-liquid stool was found in  the entire colon, interfering with visualization.                            Lavage of the area was performed using copious                            amounts, resulting in clearance with adequate                            visualization.                           A 3 mm polyp was found in the recto-sigmoid colon.                            The polyp was sessile. The polyp was removed with a                            cold snare. Resection and retrieval were complete.                           There was evidence of a prior functional end-to-end                            ileo-colonic anastomosis in the ascending colon.                            This was patent and was characterized by healthy                            appearing mucosa.                           There was evidence of a prior end-to-end                             colo-colonic anastomosis in the recto-sigmoid                            colon. This was patent and was characterized by                            healthy appearing mucosa. The anastomosis was                            traversed.                           Non-bleeding non-thrombosed external and internal                            hemorrhoids were found during retroflexion, during                            perianal exam and during digital exam. The  hemorrhoids were Grade II (internal hemorrhoids                            that prolapse but reduce spontaneously). Complications:            No immediate complications. Estimated Blood Loss:     Estimated blood loss was minimal. Impression:               - Hemorrhoids found on digital rectal exam.                           - Stool in the entire examined colon.                           - One 3 mm polyp at the recto-sigmoid colon,                            removed with a cold snare. Resected and retrieved.                           - Patent functional end-to-end ileo-colonic                            anastomosis, characterized by healthy appearing                            mucosa.                           - Patent end-to-end colo-colonic anastomosis,                            characterized by healthy appearing mucosa.                           - Non-bleeding non-thrombosed external and internal                            hemorrhoids. Recommendation:           - The patient will be observed post-procedure,                            until all discharge criteria are met.                           - Discharge patient to home.                           - Patient has a contact number available for                            emergencies. The signs and symptoms of potential                            delayed complications were discussed with the  patient. Return to normal activities  tomorrow.                            Written discharge instructions were provided to the                            patient.                           - High fiber diet.                           - Use FiberCon 1-2 tablets PO daily.                           - Continue present medications.                           - Await pathology results.                           - Repeat colonoscopy in 5 years for screening                            purposes no matter pathology due to the adequate                            visualization that required a 2-day preparation and                            >3L of lavage to be made.                           - If patient desires can discuss constipation                            treatment in the future.                           - The findings and recommendations were discussed                            with the patient.                           - The findings and recommendations were discussed                            with the patient's family. Justice Britain, MD 10/18/2021 2:32:11 PM
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
## 2021-10-18 NOTE — Progress Notes (Signed)
Report given to PACU, vss 

## 2021-10-18 NOTE — Progress Notes (Signed)
Called to room to assist during endoscopic procedure.  Patient ID and intended procedure confirmed with present staff. Received instructions for my participation in the procedure from the performing physician.  

## 2021-10-18 NOTE — Progress Notes (Signed)
Fall Risk sign hanging on IV pole. maw

## 2021-10-18 NOTE — Patient Instructions (Signed)
Handout for high fiber diet.  Use fiber Con 1-2 tablets oral daily.  Continue present medications.  YOU HAD AN ENDOSCOPIC PROCEDURE TODAY AT [facility]:   Refer to the procedure report that was given to you for any specific questions about what was found during the examination.  If the procedure report does not answer your questions, please call your gastroenterologist to clarify.  If you requested that your care partner not be given the details of your procedure findings, then the procedure report has been included in a sealed envelope for you to review at your convenience later.  YOU SHOULD EXPECT: Some feelings of bloating in the abdomen. Passage of more gas than usual.  Walking can help get rid of the air that was put into your GI tract during the procedure and reduce the bloating. If you had a lower endoscopy (such as a colonoscopy or flexible sigmoidoscopy) you may notice spotting of blood in your stool or on the toilet paper. If you underwent a bowel prep for your procedure, you may not have a normal bowel movement for a few days.  Please Note:  You might notice some irritation and congestion in your nose or some drainage.  This is from the oxygen used during your procedure.  There is no need for concern and it should clear up in a day or so.  SYMPTOMS TO REPORT IMMEDIATELY:  Following lower endoscopy (colonoscopy or flexible sigmoidoscopy):  Excessive amounts of blood in the stool  Significant tenderness or worsening of abdominal pains  Swelling of the abdomen that is new, acute  Fever of 100F or higher  For urgent or emergent issues, a gastroenterologist can be reached at any hour by calling [phone]. Do not use MyChart messaging for urgent concerns.    DIET:  We do recommend a small meal at first, but then you may proceed to your regular diet.  Drink plenty of fluids but you should avoid alcoholic beverages for 24 hours.  ACTIVITY:  You should plan to take it  easy for the rest of today and you should NOT DRIVE or use heavy machinery until tomorrow (because of the sedation medicines used during the test).    FOLLOW UP: Our staff will call the number listed on your records 48-72 hours following your procedure to check on you and address any questions or concerns that you may have regarding the information given to you following your procedure. If we do not reach you, we will leave a message.  We will attempt to reach you two times.  During this call, we will ask if you have developed any symptoms of COVID 19. If you develop any symptoms (ie: fever, flu-like symptoms, shortness of breath, cough etc.) before then, please call [phone].  If you test positive for Covid 19 in the 2 weeks post procedure, please call and report this information to [place].    If any biopsies were taken you will be contacted by phone or by letter within the next 1-3 weeks.  Please call us at [phone] if you have not heard about the biopsies in 3 weeks.    SIGNATURES/CONFIDENTIALITY: You and/or your care partner have signed paperwork which will be entered into your electronic medical record.  These signatures attest to the fact that that the information above on your After Visit Summary has been reviewed and is understood.  Full responsibility of the confidentiality of this discharge information lies with you and/or your care-partner.

## 2021-10-20 ENCOUNTER — Telehealth: Payer: Self-pay

## 2021-10-20 ENCOUNTER — Encounter: Payer: Self-pay | Admitting: Internal Medicine

## 2021-10-20 DIAGNOSIS — R29898 Other symptoms and signs involving the musculoskeletal system: Secondary | ICD-10-CM

## 2021-10-20 DIAGNOSIS — M5104 Intervertebral disc disorders with myelopathy, thoracic region: Secondary | ICD-10-CM

## 2021-10-20 NOTE — Telephone Encounter (Signed)
Pt is requesting a new PT referral. Current one is outdated.  Please advise [phone]

## 2021-10-20 NOTE — Telephone Encounter (Signed)
°  Follow up Call-  Call back number 10/18/2021 10/14/2021  Post procedure Call Back phone  # [phone] cell [phone]  Permission to leave phone message Yes Yes  Some recent data might be hidden     Patient questions:  Do you have a fever, pain , or abdominal swelling? No. Pain Score  0 *  Have you tolerated food without any problems? Yes.    Have you been able to return to your normal activities? Yes.    Do you have any questions about your discharge instructions: Diet   No. Medications  No. Follow up visit  No.  Do you have questions or concerns about your Care? No.  Actions: * If pain score is 4 or above: No action needed, pain <4.

## 2021-10-21 NOTE — Telephone Encounter (Signed)
It is done.  Thanks 

## 2021-10-25 ENCOUNTER — Encounter: Payer: Self-pay | Admitting: Gastroenterology

## 2021-11-01 ENCOUNTER — Other Ambulatory Visit: Payer: Self-pay

## 2021-11-01 ENCOUNTER — Ambulatory Visit: Payer: BC Managed Care – PPO | Attending: Internal Medicine | Admitting: Physical Therapy

## 2021-11-01 ENCOUNTER — Encounter: Payer: Self-pay | Admitting: Physical Therapy

## 2021-11-01 DIAGNOSIS — R262 Difficulty in walking, not elsewhere classified: Secondary | ICD-10-CM | POA: Diagnosis not present

## 2021-11-01 DIAGNOSIS — R29898 Other symptoms and signs involving the musculoskeletal system: Secondary | ICD-10-CM | POA: Insufficient documentation

## 2021-11-01 DIAGNOSIS — M546 Pain in thoracic spine: Secondary | ICD-10-CM | POA: Insufficient documentation

## 2021-11-01 DIAGNOSIS — M5104 Intervertebral disc disorders with myelopathy, thoracic region: Secondary | ICD-10-CM | POA: Diagnosis not present

## 2021-11-01 DIAGNOSIS — R2689 Other abnormalities of gait and mobility: Secondary | ICD-10-CM | POA: Insufficient documentation

## 2021-11-01 DIAGNOSIS — M62838 Other muscle spasm: Secondary | ICD-10-CM | POA: Diagnosis not present

## 2021-11-01 DIAGNOSIS — R29818 Other symptoms and signs involving the nervous system: Secondary | ICD-10-CM | POA: Diagnosis not present

## 2021-11-01 DIAGNOSIS — M6281 Muscle weakness (generalized): Secondary | ICD-10-CM | POA: Diagnosis not present
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
## 2021-11-01 NOTE — Patient Instructions (Signed)
Access Code: [email or web address] ?URL: [email or web address] ?Date: 11/01/2021 ?Prepared by: Isabelle Course ? ?Exercises ?Supine Bridge - 1 x daily - 7 x weekly - 3 sets - 10 reps ?Marching Bridge - 1 x daily - 7 x weekly - 3 sets - 10 reps ?Single Leg Bridge - 1 x daily - 7 x weekly - 3 sets - 10 reps ?Hip and Knee Flexion with Anchored Resistance - 1 x daily - 7 x weekly - 3 sets - 10 reps ? ?

## 2021-11-04 ENCOUNTER — Ambulatory Visit: Payer: BC Managed Care – PPO | Admitting: Physical Therapy

## 2021-11-04 ENCOUNTER — Other Ambulatory Visit: Payer: Self-pay

## 2021-11-04 DIAGNOSIS — R262 Difficulty in walking, not elsewhere classified: Secondary | ICD-10-CM | POA: Diagnosis not present

## 2021-11-04 DIAGNOSIS — M5104 Intervertebral disc disorders with myelopathy, thoracic region: Secondary | ICD-10-CM | POA: Diagnosis not present

## 2021-11-04 DIAGNOSIS — M62838 Other muscle spasm: Secondary | ICD-10-CM | POA: Diagnosis not present

## 2021-11-04 DIAGNOSIS — R2689 Other abnormalities of gait and mobility: Secondary | ICD-10-CM | POA: Diagnosis not present

## 2021-11-04 DIAGNOSIS — R29818 Other symptoms and signs involving the nervous system: Secondary | ICD-10-CM | POA: Diagnosis not present

## 2021-11-04 DIAGNOSIS — M546 Pain in thoracic spine: Secondary | ICD-10-CM | POA: Diagnosis not present

## 2021-11-04 DIAGNOSIS — M6281 Muscle weakness (generalized): Secondary | ICD-10-CM | POA: Diagnosis not present

## 2021-11-04 DIAGNOSIS — R29898 Other symptoms and signs involving the musculoskeletal system: Secondary | ICD-10-CM | POA: Diagnosis not present
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
## 2021-11-11 ENCOUNTER — Other Ambulatory Visit: Payer: Self-pay

## 2021-11-11 ENCOUNTER — Ambulatory Visit: Payer: BC Managed Care – PPO | Admitting: Rehabilitative and Restorative Service Providers"

## 2021-11-11 DIAGNOSIS — R262 Difficulty in walking, not elsewhere classified: Secondary | ICD-10-CM

## 2021-11-11 DIAGNOSIS — M5104 Intervertebral disc disorders with myelopathy, thoracic region: Secondary | ICD-10-CM | POA: Diagnosis not present

## 2021-11-11 DIAGNOSIS — R29818 Other symptoms and signs involving the nervous system: Secondary | ICD-10-CM | POA: Diagnosis not present

## 2021-11-11 DIAGNOSIS — M546 Pain in thoracic spine: Secondary | ICD-10-CM

## 2021-11-11 DIAGNOSIS — M6281 Muscle weakness (generalized): Secondary | ICD-10-CM

## 2021-11-11 DIAGNOSIS — R29898 Other symptoms and signs involving the musculoskeletal system: Secondary | ICD-10-CM | POA: Diagnosis not present

## 2021-11-11 DIAGNOSIS — M62838 Other muscle spasm: Secondary | ICD-10-CM | POA: Diagnosis not present

## 2021-11-11 DIAGNOSIS — R2689 Other abnormalities of gait and mobility: Secondary | ICD-10-CM
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
## 2021-11-11 NOTE — Patient Instructions (Signed)
Access Code: [email or web address] ?URL: [email or web address] ?Date: 11/11/2021 ?Prepared by: Rudell Cobb ? ?Exercises ?Supine Bridge - 1 x daily - 7 x weekly - 3 sets - 10 reps ?Marching Bridge - 1 x daily - 7 x weekly - 3 sets - 10 reps ?Single Leg Bridge - 1 x daily - 7 x weekly - 3 sets - 10 reps ?Hip and Knee Flexion with Anchored Resistance - 1 x daily - 7 x weekly - 3 sets - 10 reps ?Staggered Stance Step Throughs - 2 x daily - 7 x weekly - 1 sets - 10 reps ?Standing Toe Dorsiflexion Stretch - 2 x daily - 7 x weekly - 1 sets - 5 reps - 20 seconds hold ? ?

## 2021-11-17 ENCOUNTER — Ambulatory Visit: Payer: BC Managed Care – PPO | Admitting: Physical Therapy

## 2021-11-17 ENCOUNTER — Other Ambulatory Visit: Payer: Self-pay

## 2021-11-17 DIAGNOSIS — M546 Pain in thoracic spine: Secondary | ICD-10-CM | POA: Diagnosis not present

## 2021-11-17 DIAGNOSIS — R2689 Other abnormalities of gait and mobility: Secondary | ICD-10-CM | POA: Diagnosis not present

## 2021-11-17 DIAGNOSIS — M6281 Muscle weakness (generalized): Secondary | ICD-10-CM

## 2021-11-17 DIAGNOSIS — R262 Difficulty in walking, not elsewhere classified: Secondary | ICD-10-CM

## 2021-11-17 DIAGNOSIS — R29898 Other symptoms and signs involving the musculoskeletal system: Secondary | ICD-10-CM | POA: Diagnosis not present

## 2021-11-17 DIAGNOSIS — M62838 Other muscle spasm: Secondary | ICD-10-CM

## 2021-11-17 DIAGNOSIS — R29818 Other symptoms and signs involving the nervous system: Secondary | ICD-10-CM | POA: Diagnosis not present

## 2021-11-17 DIAGNOSIS — M5104 Intervertebral disc disorders with myelopathy, thoracic region: Secondary | ICD-10-CM | POA: Diagnosis not present
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
## 2021-11-18 ENCOUNTER — Encounter: Payer: Self-pay | Admitting: Physical Therapy

## 2021-11-25 ENCOUNTER — Ambulatory Visit: Payer: BC Managed Care – PPO | Admitting: Physical Therapy

## 2021-11-25 DIAGNOSIS — R29898 Other symptoms and signs involving the musculoskeletal system: Secondary | ICD-10-CM | POA: Diagnosis not present

## 2021-11-25 DIAGNOSIS — M62838 Other muscle spasm: Secondary | ICD-10-CM | POA: Diagnosis not present

## 2021-11-25 DIAGNOSIS — M5104 Intervertebral disc disorders with myelopathy, thoracic region: Secondary | ICD-10-CM | POA: Diagnosis not present

## 2021-11-25 DIAGNOSIS — M6281 Muscle weakness (generalized): Secondary | ICD-10-CM | POA: Diagnosis not present

## 2021-11-25 DIAGNOSIS — R262 Difficulty in walking, not elsewhere classified: Secondary | ICD-10-CM

## 2021-11-25 DIAGNOSIS — M546 Pain in thoracic spine: Secondary | ICD-10-CM | POA: Diagnosis not present

## 2021-11-25 DIAGNOSIS — R2689 Other abnormalities of gait and mobility: Secondary | ICD-10-CM

## 2021-11-25 DIAGNOSIS — R29818 Other symptoms and signs involving the nervous system: Secondary | ICD-10-CM | POA: Diagnosis not present
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
## 2021-11-29 ENCOUNTER — Ambulatory Visit: Payer: BC Managed Care – PPO | Attending: Internal Medicine | Admitting: Physical Therapy

## 2021-11-29 DIAGNOSIS — R262 Difficulty in walking, not elsewhere classified: Secondary | ICD-10-CM | POA: Diagnosis not present

## 2021-11-29 DIAGNOSIS — R2689 Other abnormalities of gait and mobility: Secondary | ICD-10-CM | POA: Insufficient documentation

## 2021-11-29 DIAGNOSIS — M6281 Muscle weakness (generalized): Secondary | ICD-10-CM | POA: Diagnosis not present
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
## 2021-12-09 ENCOUNTER — Encounter: Payer: BC Managed Care – PPO | Admitting: Physical Therapy

## 2021-12-16 ENCOUNTER — Encounter: Payer: Self-pay | Admitting: Gastroenterology

## 2021-12-16 ENCOUNTER — Ambulatory Visit: Payer: BC Managed Care – PPO | Admitting: Physical Therapy

## 2021-12-16 ENCOUNTER — Ambulatory Visit: Payer: BC Managed Care – PPO | Admitting: Gastroenterology

## 2021-12-16 ENCOUNTER — Other Ambulatory Visit: Payer: Self-pay | Admitting: Internal Medicine

## 2021-12-16 VITALS — BP 124/74 | HR 77 | Ht 73.0 in | Wt 238.2 lb

## 2021-12-16 DIAGNOSIS — R262 Difficulty in walking, not elsewhere classified: Secondary | ICD-10-CM

## 2021-12-16 DIAGNOSIS — K5904 Chronic idiopathic constipation: Secondary | ICD-10-CM | POA: Diagnosis not present

## 2021-12-16 DIAGNOSIS — K581 Irritable bowel syndrome with constipation: Secondary | ICD-10-CM

## 2021-12-16 DIAGNOSIS — M6281 Muscle weakness (generalized): Secondary | ICD-10-CM | POA: Diagnosis not present

## 2021-12-16 DIAGNOSIS — R2689 Other abnormalities of gait and mobility: Secondary | ICD-10-CM

## 2021-12-16 DIAGNOSIS — K6289 Other specified diseases of anus and rectum: Secondary | ICD-10-CM

## 2021-12-16 DIAGNOSIS — R152 Fecal urgency: Secondary | ICD-10-CM | POA: Diagnosis not present

## 2021-12-16 DIAGNOSIS — R1084 Generalized abdominal pain: Secondary | ICD-10-CM | POA: Diagnosis not present

## 2021-12-16 NOTE — Patient Instructions (Addendum)
Please follow up with Dr Rush Landmark in 3 months. ? ?Please purchase the following medications over the counter and take as directed: ?Miralax 17 grams (1 capful) dissolved in at least 8 ounces water/juice 1-2 times daily ? ?We have given you samples of the following medication to take: ?Linzess 145 mcg daily ? ?Please send [place] a mychart message in 3-4 weeks with an update on your bowels following the above regimen. ? ?Please consider using a "squatty potty" or single step stool to improve toileting ergonomics. ? ?Toileting tips to help with your constipation ?- Drink at least 64-80 ounces of water/liquid per day. ?- Establish a time to try to move your bowels every day.  For many people, this is after a cup of coffee or after a meal such as breakfast. ?- Sit all of the way back on the toilet keeping your back fairly straight and while sitting up, try to rest the tops of your forearms on your upper thighs.   ?- Raising your feet with a step stool/squatty potty can be helpful to improve the angle that allows your stool to pass through the rectum. ?- Relax the rectum feeling it bulge toward the toilet water.  If you feel your rectum raising toward your body, you are contracting rather than relaxing. ?- Breathe in and slowly exhale. "Belly breath" by expanding your belly towards your belly button. Keep belly expanded as you gently direct pressure down and back to the anus.  A low pitched GRRR sound can assist with increasing intra-abdominal pressure.  ?- Repeat 3-4 times. If unsuccessful, contract the pelvic floor to restore normal tone and get off the toilet.  Avoid excessive straining. ?- To reduce excessive wiping by teaching your anus to normally contract, place hands on outer aspect of knees and resist knee movement outward.  Hold 5-10 second then place hands just inside of knees and resist inward movement of knees.  Hold 5 seconds.  Repeat a few times each way. ? ?If you are age 54 or older, your body mass index  should be between 23-30. Your Body mass index is 31.43 kg/m?Marland Kitchen If this is out of the aforementioned range listed, please consider follow up with your Primary Care Provider. ? ?If you are age 54 or younger, your body mass index should be between 19-25. Your Body mass index is 31.43 kg/m?Marland Kitchen If this is out of the aformentioned range listed, please consider follow up with your Primary Care Provider.  ? ?________________________________________________________ ? ?The [facility] providers would like to encourage you to use [facility] to communicate with providers for non-urgent requests or questions.  Due to long hold times on the telephone, sending your provider a message by [facility] may be a faster and more efficient way to get a response.  Please allow 48 business hours for a response.  Please remember that this is for non-urgent requests.  ?_______________________________________________________ ? ?Due to recent changes in healthcare laws, you may see the results of your imaging and laboratory studies on MyChart before your provider has had a chance to review them.  We understand that in some cases there may be results that are confusing or concerning to you. Not all laboratory results come back in the same time frame and the provider may be waiting for multiple results in order to interpret others.  Please give [place] 48 hours in order for your provider to thoroughly review all the results before contacting the office for clarification of your results.  ? ?
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
## 2021-12-19 ENCOUNTER — Encounter: Payer: Self-pay | Admitting: Gastroenterology

## 2021-12-19 DIAGNOSIS — K6289 Other specified diseases of anus and rectum: Secondary | ICD-10-CM | POA: Insufficient documentation

## 2021-12-19 DIAGNOSIS — K5904 Chronic idiopathic constipation: Secondary | ICD-10-CM | POA: Insufficient documentation

## 2021-12-19 DIAGNOSIS — K581 Irritable bowel syndrome with constipation: Secondary | ICD-10-CM | POA: Insufficient documentation

## 2021-12-19 DIAGNOSIS — R152 Fecal urgency: Secondary | ICD-10-CM | POA: Insufficient documentation

## 2021-12-23 ENCOUNTER — Ambulatory Visit: Payer: BC Managed Care – PPO | Admitting: Physical Therapy

## 2021-12-23 DIAGNOSIS — R262 Difficulty in walking, not elsewhere classified: Secondary | ICD-10-CM

## 2021-12-23 DIAGNOSIS — R2689 Other abnormalities of gait and mobility: Secondary | ICD-10-CM | POA: Diagnosis not present

## 2021-12-23 DIAGNOSIS — M6281 Muscle weakness (generalized): Secondary | ICD-10-CM | POA: Diagnosis not present
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
## 2022-01-03 ENCOUNTER — Ambulatory Visit: Payer: BC Managed Care – PPO | Admitting: Internal Medicine

## 2022-01-03 ENCOUNTER — Telehealth: Payer: Self-pay | Admitting: *Deleted

## 2022-01-03 ENCOUNTER — Encounter: Payer: Self-pay | Admitting: Internal Medicine

## 2022-01-03 VITALS — BP 130/90 | HR 80 | Temp 98.2°F | Wt 232.0 lb

## 2022-01-03 DIAGNOSIS — R29898 Other symptoms and signs involving the musculoskeletal system: Secondary | ICD-10-CM

## 2022-01-03 DIAGNOSIS — E119 Type 2 diabetes mellitus without complications: Secondary | ICD-10-CM | POA: Diagnosis not present

## 2022-01-03 DIAGNOSIS — N183 Chronic kidney disease, stage 3 unspecified: Secondary | ICD-10-CM

## 2022-01-03 DIAGNOSIS — K649 Unspecified hemorrhoids: Secondary | ICD-10-CM

## 2022-01-03 DIAGNOSIS — I10 Essential (primary) hypertension: Secondary | ICD-10-CM | POA: Diagnosis not present

## 2022-01-03 DIAGNOSIS — R634 Abnormal weight loss: Secondary | ICD-10-CM

## 2022-01-03 LAB — CBC WITH DIFFERENTIAL/PLATELET
Basophils Absolute: 0 10*3/uL (ref 0.0–0.1)
Basophils Relative: 0.5 % (ref 0.0–3.0)
Eosinophils Absolute: 0 10*3/uL (ref 0.0–0.7)
Eosinophils Relative: 0.9 % (ref 0.0–5.0)
HCT: 44 % (ref 39.0–52.0)
Hemoglobin: 14.7 g/dL (ref 13.0–17.0)
Lymphocytes Relative: 51.7 % — ABNORMAL HIGH (ref 12.0–46.0)
Lymphs Abs: 2.5 10*3/uL (ref 0.7–4.0)
MCHC: 33.4 g/dL (ref 30.0–36.0)
MCV: 95 fl (ref 78.0–100.0)
Monocytes Absolute: 0.3 10*3/uL (ref 0.1–1.0)
Monocytes Relative: 6.5 % (ref 3.0–12.0)
Neutro Abs: 1.9 10*3/uL (ref 1.4–7.7)
Neutrophils Relative %: 40.4 % — ABNORMAL LOW (ref 43.0–77.0)
Platelets: 192 10*3/uL (ref 150.0–400.0)
RBC: 4.63 Mil/uL (ref 4.22–5.81)
RDW: 13.6 % (ref 11.5–15.5)
WBC: 4.8 10*3/uL (ref 4.0–10.5)

## 2022-01-03 LAB — COMPREHENSIVE METABOLIC PANEL
ALT: 17 U/L (ref 0–53)
AST: 33 U/L (ref 0–37)
Albumin: 4.8 g/dL (ref 3.5–5.2)
Alkaline Phosphatase: 50 U/L (ref 39–117)
BUN: 28 mg/dL — ABNORMAL HIGH (ref 6–23)
CO2: 28 mEq/L (ref 19–32)
Calcium: 10.5 mg/dL (ref 8.4–10.5)
Chloride: 101 mEq/L (ref 96–112)
Creatinine, Ser: 1.74 mg/dL — ABNORMAL HIGH (ref 0.40–1.50)
GFR: 43.97 mL/min — ABNORMAL LOW (ref >60.00)
Glucose, Bld: 82 mg/dL (ref 70–99)
Potassium: 4.5 mEq/L (ref 3.5–5.1)
Sodium: 138 mEq/L (ref 135–145)
Total Bilirubin: 1 mg/dL (ref 0.2–1.2)
Total Protein: 8.3 g/dL (ref 6.0–8.3)

## 2022-01-03 LAB — HEMOGLOBIN A1C: Hgb A1c MFr Bld: 5.8 % (ref 4.6–6.5)

## 2022-01-03 MED ORDER — TRIAMCINOLONE ACETONIDE 0.1 % EX OINT: 1.0000 "application " | TOPICAL_OINTMENT | Freq: Three times a day (TID) | CUTANEOUS | 2 refills | Status: DC | PRN

## 2022-01-03 NOTE — Assessment & Plan Note (Signed)
F/u Dr Hollie Salk ?Continue with good hydration and current losartan and Synjardy.  Avoid NSAIDs.  Follow-up with nephrology. ?We may need to reduce his Metformin and losartan dose ?
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
## 2022-01-03 NOTE — Assessment & Plan Note (Signed)
Cont on Losartan °

## 2022-01-03 NOTE — Assessment & Plan Note (Signed)
Better  ?OK to drive - form for DMV ?

## 2022-01-03 NOTE — Assessment & Plan Note (Signed)
Doing well 

## 2022-01-03 NOTE — Telephone Encounter (Signed)
Called pt to see if he wanted original of the [facility] forms that was faxed to [facility]. Pt states yes he will pick-up tomorrow morning. Left envelope up front for pick-up.Marland Kitchen/l,mb ?

## 2022-01-03 NOTE — Assessment & Plan Note (Addendum)
New ?Start Anusol [facility] ? ?

## 2022-01-03 NOTE — Assessment & Plan Note (Signed)
On diet Check A1c 

## 2022-01-20 ENCOUNTER — Ambulatory Visit: Payer: BC Managed Care – PPO | Admitting: Physical Therapy

## 2022-02-08 ENCOUNTER — Encounter: Payer: Self-pay | Admitting: Internal Medicine

## 2022-02-20 ENCOUNTER — Other Ambulatory Visit (HOSPITAL_COMMUNITY): Payer: Self-pay

## 2022-02-21 ENCOUNTER — Encounter: Payer: Self-pay | Admitting: Gastroenterology

## 2022-02-27 ENCOUNTER — Encounter: Payer: Self-pay | Admitting: Family Medicine

## 2022-02-27 ENCOUNTER — Ambulatory Visit: Payer: BC Managed Care – PPO | Admitting: Family Medicine

## 2022-02-27 VITALS — BP 136/80 | HR 71 | Temp 97.8°F | Ht 73.0 in | Wt 237.0 lb

## 2022-02-27 DIAGNOSIS — L02211 Cutaneous abscess of abdominal wall: Secondary | ICD-10-CM

## 2022-02-27 DIAGNOSIS — L03311 Cellulitis of abdominal wall: Secondary | ICD-10-CM

## 2022-02-27 LAB — CBC WITH DIFFERENTIAL/PLATELET
Basophils Absolute: 0 10*3/uL (ref 0.0–0.1)
Basophils Relative: 0.6 % (ref 0.0–3.0)
Eosinophils Absolute: 0 10*3/uL (ref 0.0–0.7)
Eosinophils Relative: 0.3 % (ref 0.0–5.0)
HCT: 40.9 % (ref 39.0–52.0)
Hemoglobin: 13.7 g/dL (ref 13.0–17.0)
Lymphocytes Relative: 31.4 % (ref 12.0–46.0)
Lymphs Abs: 2.6 10*3/uL (ref 0.7–4.0)
MCHC: 33.5 g/dL (ref 30.0–36.0)
MCV: 93.9 fl (ref 78.0–100.0)
Monocytes Absolute: 0.7 10*3/uL (ref 0.1–1.0)
Monocytes Relative: 7.9 % (ref 3.0–12.0)
Neutro Abs: 5 10*3/uL (ref 1.4–7.7)
Neutrophils Relative %: 59.8 % (ref 43.0–77.0)
Platelets: 191 10*3/uL (ref 150.0–400.0)
RBC: 4.36 Mil/uL (ref 4.22–5.81)
RDW: 13.6 % (ref 11.5–15.5)
WBC: 8.4 10*3/uL (ref 4.0–10.5)

## 2022-02-27 MED ORDER — SULFAMETHOXAZOLE-TRIMETHOPRIM 800-160 MG PO TABS: 1.0000 | ORAL_TABLET | Freq: Two times a day (BID) | ORAL | 0 refills | Status: DC
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
## 2022-02-27 NOTE — Patient Instructions (Signed)
Take the antibiotic as prescribed.   Continue keeping the area packed and covered.   I have referred you to the wound care center and general surgeon. They will call you to schedule.

## 2022-03-02 DIAGNOSIS — L02211 Cutaneous abscess of abdominal wall: Secondary | ICD-10-CM | POA: Insufficient documentation

## 2022-03-02 NOTE — Assessment & Plan Note (Addendum)
Recurrent issue. Bactrim prescribed. Referral to wound care and back to surgeon per patient request.

## 2022-03-02 NOTE — Assessment & Plan Note (Signed)
Open and draining. Tunneling present. Bactrim prescribed. Referred to wound care and to general surgery for further evaluation.

## 2022-03-17 ENCOUNTER — Ambulatory Visit (HOSPITAL_BASED_OUTPATIENT_CLINIC_OR_DEPARTMENT_OTHER): Payer: BC Managed Care – PPO | Admitting: General Surgery

## 2022-03-20 ENCOUNTER — Encounter (HOSPITAL_BASED_OUTPATIENT_CLINIC_OR_DEPARTMENT_OTHER): Payer: BC Managed Care – PPO | Attending: Internal Medicine | Admitting: General Surgery

## 2022-03-20 DIAGNOSIS — E1169 Type 2 diabetes mellitus with other specified complication: Secondary | ICD-10-CM | POA: Insufficient documentation

## 2022-03-20 DIAGNOSIS — N183 Chronic kidney disease, stage 3 unspecified: Secondary | ICD-10-CM | POA: Diagnosis not present

## 2022-03-20 DIAGNOSIS — L905 Scar conditions and fibrosis of skin: Secondary | ICD-10-CM | POA: Diagnosis not present

## 2022-03-20 DIAGNOSIS — E1122 Type 2 diabetes mellitus with diabetic chronic kidney disease: Secondary | ICD-10-CM | POA: Diagnosis not present

## 2022-03-20 DIAGNOSIS — I129 Hypertensive chronic kidney disease with stage 1 through stage 4 chronic kidney disease, or unspecified chronic kidney disease: Secondary | ICD-10-CM | POA: Insufficient documentation

## 2022-03-20 NOTE — Progress Notes (Signed)
[name], [name] ([id number]) Visit Report for 03/20/2022 Allergy List Details Patient Name: Date of Service: [name], [name] 03/20/2022 8:00 A M Medical Record Number: [id number] Patient Account Number: [id number] Date of Birth/Sex: Treating RN: 08-24-1966 (56 y.o. [name] Primary Care [name]: [name] Other Clinician: Referring [name]: Treating [name]/Extender: [name]: 0 Allergies Active Allergies shellfish derived Severity: Severe clams Severity: Severe ACE Inhibitors Severity: Severe hydrochlorothiazide Severity: Severe Valsatan Severity: Severe Type: Medication Allergy Notes Electronic Signature(s) Signed: 03/20/2022 12:55:32 PM By: [name] Catholic RN Entered By: [name] on 03/20/2022 08:11:16 -------------------------------------------------------------------------------- Arrival Information Details Patient Name: Date of Service: [name], [name]. 03/20/2022 8:00 A M Medical Record Number: [id number] Patient Account Number: [id number] Date of Birth/Sex: Treating RN: [birth year] (56 y.o. [name] Primary Care [name]: [name] Other Clinician: Referring Bryonna Sundby: Treating [name]/Extender: [name] in [name]: 0 Visit Information Patient Arrived: Ambulatory Arrival Time: 08:00 Accompanied By: mother Transfer Assistance: None Patient Identification Verified: Yes Electronic Signature(s) Signed: 03/20/2022 4:24:29 PM By: [name] Entered By: [name] on 03/20/2022 08:13:40 -------------------------------------------------------------------------------- Clinic Level of Care Assessment Details Patient Name: Date of Service: [name], [name]. 03/20/2022 8:00 A M Medical Record Number: [id number] Patient Account Number: [id number] Date of Birth/Sex: Treating RN: 30-Sep-1965 (56 y.o. [name] Primary  Care [name]: [name] Other Clinician: Referring Maragret Vanacker: Treating [name]/Extender: [name] in [name]: 0 Clinic Level of Care Assessment Items TOOL 2 Quantity Score X- 1 0 Use when only an EandM is performed on the INITIAL visit ASSESSMENTS - Nursing Assessment / Reassessment X- 1 20 General Physical Exam (combine w/ comprehensive assessment (listed just below) when performed on new pt. evals) X- 1 25 Comprehensive Assessment (HX, ROS, Risk Assessments, Wounds Hx, etc.) ASSESSMENTS - Wound and Skin A ssessment / Reassessment X - Simple Wound Assessment / Reassessment - one wound 1 5 '[]'$  - 0 Complex Wound Assessment / Reassessment - multiple wounds '[]'$  - 0 Dermatologic / Skin Assessment (not related to wound area) ASSESSMENTS - Ostomy and/or Continence Assessment and Care '[]'$  - 0 Incontinence Assessment and Management '[]'$  - 0 Ostomy Care Assessment and Management (repouching, etc.) PROCESS - Coordination of Care X - Simple Patient / Family Education for ongoing care 1 15 '[]'$  - 0 Complex (extensive) Patient / Family Education for ongoing care X- 1 10 Staff obtains Programmer, systems, Records, T Results / Process Orders est X- 1 10 Staff telephones HHA, Nursing Homes / Clarify orders / etc '[]'$  - 0 Routine Transfer to another Facility (non-emergent condition) '[]'$  - 0 Routine Hospital Admission (non-emergent condition) X- 1 15 New Admissions / Biomedical engineer / Ordering NPWT Apligraf, etc. , '[]'$  - 0 Emergency Hospital Admission (emergent condition) X- 1 10 Simple Discharge Coordination '[]'$  - 0 Complex (extensive) Discharge Coordination PROCESS - Special Needs '[]'$  - 0 Pediatric / Minor Patient Management '[]'$  - 0 Isolation Patient Management '[]'$  - 0 Hearing / Language / Visual special needs '[]'$  - 0 Assessment of Community assistance (transportation, D/C planning, etc.) '[]'$  - 0 Additional assistance / Altered mentation '[]'$  -  0 Support Surface(s) Assessment (bed, cushion, seat, etc.) INTERVENTIONS - Wound Cleansing / Measurement X- 1 5 Wound Imaging (photographs - any number of wounds) '[]'$  - 0 Wound Tracing (instead of photographs) X- 1 5 Simple Wound Measurement - one wound '[]'$  - 0 Complex Wound Measurement - multiple wounds X- 1  5 Simple Wound Cleansing - one wound '[]'$  - 0 Complex Wound Cleansing - multiple wounds INTERVENTIONS - Wound Dressings X - Small Wound Dressing one or multiple wounds 1 10 '[]'$  - 0 Medium Wound Dressing one or multiple wounds '[]'$  - 0 Large Wound Dressing one or multiple wounds '[]'$  - 0 Application of Medications - injection INTERVENTIONS - Miscellaneous '[]'$  - 0 External ear exam '[]'$  - 0 Specimen Collection (cultures, biopsies, blood, body fluids, etc.) '[]'$  - 0 Specimen(s) / Culture(s) sent or taken to Lab for analysis '[]'$  - 0 Patient Transfer (multiple staff / Civil Service fast streamer / Similar devices) X- 1 5 Simple Staple / Suture removal (25 or less) '[]'$  - 0 Complex Staple / Suture removal (26 or more) '[]'$  - 0 Hypo / Hyperglycemic Management (close monitor of Blood Glucose) '[]'$  - 0 Ankle / Brachial Index (ABI) - do not check if billed separately Has the patient been seen at the hospital within the last three years: Yes Total Score: 140 Level Of Care: New/Established - Level 4 Notes 1 suture was removed from the abdomen Electronic Signature(s) Signed: 03/20/2022 12:55:32 PM By: [name] Catholic RN Entered By: [name] on 03/20/2022 09:01:31 -------------------------------------------------------------------------------- Encounter Discharge Information Details Patient Name: Date of Service: [name], [name] 03/20/2022 8:00 A M Medical Record Number: [id number] Patient Account Number: [id number] Date of Birth/Sex: Treating RN: [birth year] (56 y.o. [name] Primary Care Zaylei Mullane: [name] Other Clinician: Referring Daruis Swaim: Treating Josean Lycan/Extender: [name] in [name]: 0 Encounter Discharge Information Items Discharge Condition: Stable Ambulatory Status: Ambulatory Discharge Destination: Home Transportation: Private Auto Accompanied By: mother Schedule Follow-up Appointment: Yes Clinical Summary of Care: Patient Declined Electronic Signature(s) Signed: 03/20/2022 12:55:32 PM By: [name] Catholic RN Entered By: [name] on 03/20/2022 09:02:23 -------------------------------------------------------------------------------- Lower Extremity Assessment Details Patient Name: Date of Service: [name], [name] [place]. 03/20/2022 8:00 A M Medical Record Number: [id number] Patient Account Number: [id number] Date of Birth/Sex: Treating RN: 1965-11-13 (56 y.o. [name] Primary Care Bogdan Vivona: [name] Other Clinician: Referring Jet Traynham: Treating Nannie Starzyk/Extender: [name] in [name]: 0 Electronic Signature(s) Signed: 03/20/2022 12:55:32 PM By: [name] Catholic RN Entered By: [name] on 03/20/2022 08:23:10 -------------------------------------------------------------------------------- Multi Wound Chart Details Patient Name: Date of Service: [name], [place]. 03/20/2022 8:00 A M Medical Record Number: [id number] Patient Account Number: [id number] Date of Birth/Sex: Treating RN: May 28, 1966 (56 y.o. M) Primary Care Deara Bober: [name] Other Clinician: Referring Antoinette Haskett: Treating Luccas Towell/Extender: [name] in [name]: 0 Vital Signs Height(in): 73 Pulse(bpm): 61 Weight(lbs): 233 Blood Pressure(mmHg): 159/94 Body Mass Index(BMI): 30.7 Temperature(F): 98.3 Respiratory Rate(breaths/min): 16 Photos: [N/A:N/A] Abdomen - midline N/A N/A Wound Location: Gradually Appeared N/A N/A Wounding Event: Abscess N/A N/A Primary Etiology: Hypertension, Type II Diabetes N/A N/A Comorbid  History: 02/08/2022 N/A N/A Date Acquired: 0 N/A N/A Weeks of [name]: Open N/A N/A Wound Status: No N/A N/A Wound Recurrence: Yes N/A N/A Clustered Wound: 0.4x0.4x0.2 N/A N/A Measurements L x W x D (cm) 0.126 N/A N/A A (cm) : rea 0.025 N/A N/A Volume (cm) : 0.00% N/A N/A % Reduction in A rea: 0.00% N/A N/A % Reduction in Volume: Partial Thickness N/A N/A Classification: Medium N/A N/A Exudate A mount: Serous N/A N/A Exudate Type: amber N/A N/A Exudate Color: Small (1-33%) N/A N/A Granulation A mount: Red N/A N/A Granulation Quality: Large (67-100%) N/A N/A Necrotic A mount: Eschar, Adherent Slough N/A N/A Necrotic Tissue: Fat  Layer (Subcutaneous Tissue): Yes N/A N/A Exposed Structures: Fascia: No Tendon: No Muscle: No Joint: No Bone: No Small (1-33%) N/A N/A Epithelialization: [name] Notes Electronic Signature(s) Signed: 03/20/2022 8:54:27 AM By: Fredirick Maudlin MD FACS Entered By: Fredirick Maudlin on 03/20/2022 08:54:26 -------------------------------------------------------------------------------- Multi-Disciplinary Care Plan Details Patient Name: Date of Service: [name], [place]. 03/20/2022 8:00 A M Medical Record Number: [id number] Patient Account Number: [id number] Date of Birth/Sex: Treating RN: December 19, 1965 (56 y.o. [name] Primary Care Briseida Gittings: [name] Other Clinician: Referring Kendyn Zaman: Treating Dasia Guerrier/Extender: [name] in [name]: 0 Active Inactive Wound/Skin Impairment Nursing Diagnoses: Knowledge deficit related to ulceration/compromised skin integrity Goals: Patient/caregiver will verbalize understanding of skin care regimen Date Initiated: 03/20/2022 Target Resolution Date: 04/27/2022 Goal Status: Active Interventions: Assess patient/caregiver ability to perform ulcer/skin care regimen upon admission and as needed [name] Activities: Skin care regimen  initiated : 03/20/2022 Notes: Electronic Signature(s) Signed: 03/20/2022 12:55:32 PM By: [name] Catholic RN Entered By: [name] on 03/20/2022 08:57:47 -------------------------------------------------------------------------------- Pain Assessment Details Patient Name: Date of Service: [name], [name]. 03/20/2022 8:00 A M Medical Record Number: [id number] Patient Account Number: [id number] Date of Birth/Sex: Treating RN: May 18, 1966 (56 y.o. [name] Primary Care Jelesa Mangini: [name] Other Clinician: Referring Kylo Gavin: Treating Chapman Matteucci/Extender: [name] in [name]: 0 Active Problems Location of Pain Severity and Description of Pain Patient Has Paino No Site Locations Pain Management and Medication Current Pain Management: Electronic Signature(s) Signed: 03/20/2022 12:55:32 PM By: [name] Catholic RN Entered By: [name] on 03/20/2022 08:34:12 -------------------------------------------------------------------------------- Patient/Caregiver Education Details Patient Name: Date of Service: [name], [name] RRY J. 7/24/2023andnbsp8:00 A M Medical Record Number: [id number] Patient Account Number: [id number] Date of Birth/Gender: Treating RN: 05-Mar-1966 (56 y.o. [name] Primary Care Physician: [name] Other Clinician: Referring Physician: Treating Physician/Extender: [name] in [name]: 0 Education Assessment Education Provided To: Patient Education Topics Provided Wound/Skin Impairment: Methods: Explain/Verbal Responses: Return demonstration correctly Electronic Signature(s) Signed: 03/20/2022 12:55:32 PM By: [name] Catholic RN Entered By: [name] on 03/20/2022 08:58:01 -------------------------------------------------------------------------------- Wound Assessment Details Patient Name: Date of Service: [name], [place]. 03/20/2022 8:00  A M Medical Record Number: [id number] Patient Account Number: [id number] Date of Birth/Sex: Treating RN: 1965-11-22 (55 y.o. [name] Primary Care Terrius Gentile: [name] Other Clinician: Referring Onna Nodal: Treating Tamirra Sienkiewicz/Extender: [name] in [name]: 0 Wound Status Wound Number: 1 Primary Etiology: Abscess Wound Location: Abdomen - midline Wound Status: Open Wounding Event: Gradually Appeared Comorbid History: Hypertension, Type II Diabetes Date Acquired: 02/08/2022 Weeks Of [name]: 0 Clustered Wound: Yes Photos Wound Measurements Length: (cm) 0.4 Width: (cm) 0.4 Depth: (cm) 0.2 Area: (cm) 0.126 Volume: (cm) 0.025 % Reduction in Area: 0% % Reduction in Volume: 0% Epithelialization: Small (1-33%) Wound Description Classification: Partial Thickness Exudate Amount: Medium Exudate Type: Serous Exudate Color: amber Wound Bed Granulation Amount: Small (1-33%) Exposed Structure Granulation Quality: Red Fascia Exposed: No Necrotic Amount: Large (67-100%) Fat Layer (Subcutaneous Tissue) Exposed: Yes Necrotic Quality: Eschar, Adherent Slough Tendon Exposed: No Muscle Exposed: No Joint Exposed: No Bone Exposed: No [name] Notes Wound #1 (Abdomen - midline) Cleanser Soap and Water Discharge Instruction: May shower and wash wound with dial antibacterial soap and water prior to dressing change. Peri-Wound Care Topical Mupirocin Ointment Discharge Instruction: Apply Mupirocin (Bactroban) as instructed Primary Dressing Secondary Dressing Bordered Gauze, 4x4 in Discharge Instruction: Apply over primary dressing as directed. Secured With Compression  Wrap Compression Stockings Add-Ons Electronic Signature(s) Signed: 03/20/2022 12:55:32 PM By: [name] Catholic RN Entered By: [name] on 03/20/2022 08:31:52 -------------------------------------------------------------------------------- Vitals  Details Patient Name: Date of Service: [name], [name] [place]. 03/20/2022 8:00 A M Medical Record Number: [id number] Patient Account Number: [id number] Date of Birth/Sex: Treating RN: [birth year] (56 y.o. [name] Primary Care Carlissa Pesola: [name] Other Clinician: Referring Devarius Nelles: Treating Sherisse Fullilove/Extender: Greig Right in [name]: 0 Vital Signs Time Taken: 08:01 Temperature (F): 98.3 Height (in): 73 Pulse (bpm): 64 Source: Stated Respiratory Rate (breaths/min): 16 Weight (lbs): 233 Blood Pressure (mmHg): 159/94 Source: Stated Reference Range: 80 - 120 mg / dl Body Mass Index (BMI): 30.7 Electronic Signature(s) Signed: 03/20/2022 12:55:32 PM By: [name] Catholic RN Entered By: [name] on 03/20/2022 08:09:49
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
## 2022-03-21 ENCOUNTER — Telehealth: Payer: Self-pay

## 2022-03-21 DIAGNOSIS — R29898 Other symptoms and signs involving the musculoskeletal system: Secondary | ICD-10-CM

## 2022-03-21 DIAGNOSIS — S7411XS Injury of femoral nerve at hip and thigh level, right leg, sequela: Secondary | ICD-10-CM

## 2022-03-21 NOTE — Telephone Encounter (Signed)
Quillian Quince is calling on behalf of pt requesting a referral for PT.  Quillian Quince states a Demographic sheet, script, insurance info, and recent medication note.  Please fax [phone] [facility]

## 2022-03-21 NOTE — Telephone Encounter (Signed)
Called pt inform him MD is out of the office this week. Will be back in office on Monday 03/27/22. We will send once he ok.Marland KitchenJohny [name]

## 2022-03-27 ENCOUNTER — Encounter (HOSPITAL_BASED_OUTPATIENT_CLINIC_OR_DEPARTMENT_OTHER): Payer: BC Managed Care – PPO | Admitting: General Surgery

## 2022-03-27 DIAGNOSIS — E1169 Type 2 diabetes mellitus with other specified complication: Secondary | ICD-10-CM | POA: Diagnosis not present

## 2022-03-27 DIAGNOSIS — E1122 Type 2 diabetes mellitus with diabetic chronic kidney disease: Secondary | ICD-10-CM | POA: Diagnosis not present

## 2022-03-27 DIAGNOSIS — N183 Chronic kidney disease, stage 3 unspecified: Secondary | ICD-10-CM | POA: Diagnosis not present

## 2022-03-27 DIAGNOSIS — L905 Scar conditions and fibrosis of skin: Secondary | ICD-10-CM | POA: Diagnosis not present

## 2022-03-27 DIAGNOSIS — I129 Hypertensive chronic kidney disease with stage 1 through stage 4 chronic kidney disease, or unspecified chronic kidney disease: Secondary | ICD-10-CM | POA: Diagnosis not present

## 2022-03-27 NOTE — Progress Notes (Signed)
VU, LIEBMAN ([id number]) Visit Report for 03/27/2022 Arrival Information Details Patient Name: Date of Service: [name], [name] 03/27/2022 8:00 A M Medical Record Number: [id number] Patient Account Number: [id number] Date of Birth/Sex: Treating RN: 1966-06-28 (56 y.o. [name] Primary Care [name]: [name] Other Clinician: Referring [name]: Treating [name] in Treatment: 1 Visit Information History Since Last Visit Added or deleted any medications: No Patient Arrived: Ambulatory Any new allergies or adverse reactions: No Arrival Time: 07:58 Had a fall or experienced change in No Accompanied By: self activities of daily living that may affect Transfer Assistance: None risk of falls: Patient Identification Verified: Yes Signs or symptoms of abuse/neglect since last visito No Hospitalized since last visit: No Implantable device outside of the clinic excluding No cellular tissue based products placed in the center since last visit: Has Dressing in Place as Prescribed: Yes Pain Present Now: No Electronic Signature(s) Signed: 03/27/2022 12:03:05 PM By: [name] Catholic RN Entered By: [name] on 03/27/2022 08:03:15 -------------------------------------------------------------------------------- Encounter Discharge Information Details Patient Name: Date of Service: [name], [name] 03/27/2022 8:00 A M Medical Record Number: [id number] Patient Account Number: [id number] Date of Birth/Sex: Treating RN: 1966/02/01 (56 y.o. [name] Primary Care Legrande Hao: [name] Other Clinician: Referring Kaelen Brennan: Treating Shamarra Warda/Extender: [name] in Treatment: 1 Encounter Discharge Information Items Discharge Condition: Stable Ambulatory Status: Ambulatory Discharge Destination: Home Transportation: Private Auto Accompanied By: self Schedule  Follow-up Appointment: Yes Clinical Summary of Care: Patient Declined Electronic Signature(s) Signed: 03/27/2022 12:03:05 PM By: [name] Catholic RN Entered By: [name] on 03/27/2022 12:02:43 -------------------------------------------------------------------------------- Lower Extremity Assessment Details Patient Name: Date of Service: [name], [name] 03/27/2022 8:00 A M Medical Record Number: [id number] Patient Account Number: [id number] Date of Birth/Sex: Treating RN: 10-31-1965 (56 y.o. [name] Primary Care Ralpheal Zappone: [name] Other Clinician: Referring Waunetta Riggle: Treating Ilene Witcher/Extender: [name] in Treatment: 1 Electronic Signature(s) Signed: 03/27/2022 12:03:05 PM By: [name] Catholic RN Entered By: [name] on 03/27/2022 08:04:17 -------------------------------------------------------------------------------- Multi Wound Chart Details Patient Name: Date of Service: [name], [name] 03/27/2022 8:00 A M Medical Record Number: [id number] Patient Account Number: [id number] Date of Birth/Sex: Treating RN: 14-Aug-1966 (56 y.o. M) Primary Care Khalil Belote: [name] Other Clinician: Referring Nashaun Hillmer: Treating Neven Fina/Extender: [name] in Treatment: 1 Vital Signs Height(in): 73 Pulse(bpm): 3 Weight(lbs): 233 Blood Pressure(mmHg): 138/82 Body Mass Index(BMI): 30.7 Temperature(F): 97.6 Respiratory Rate(breaths/min): 16 Photos: [N/A:N/A] Abdomen - midline Left Abdomen - midline N/A Wound Location: Gradually Appeared Gradually Appeared N/A Wounding Event: Abscess T be determined o N/A Primary Etiology: Hypertension, Type II Diabetes Hypertension, Type II Diabetes N/A Comorbid History: 02/08/2022 03/27/2022 N/A Date Acquired: 1 0 N/A Weeks of Treatment: Healed - Epithelialized Open N/A Wound Status: No No N/A Wound Recurrence: Yes No  N/A Clustered Wound: 0x0x0 0.1x0.1x0.1 N/A Measurements L x W x D (cm) 0 0.008 N/A A (cm) : rea 0 0.001 N/A Volume (cm) : 100.00% 0.00% N/A % Reduction in A rea: 100.00% 0.00% N/A % Reduction in Volume: Partial Thickness Partial Thickness N/A Classification: Medium Medium N/A Exudate A mount: Serous Serosanguineous N/A Exudate Type: amber red, brown N/A Exudate Color: None Present (0%) Medium (34-66%) N/A Granulation A mount: N/A Pink N/A Granulation Quality: None Present (0%) Medium (34-66%) N/A Necrotic A mount: Fascia: No Fat Layer (Subcutaneous Tissue): Yes N/A Exposed Structures: Fat Layer (Subcutaneous Tissue): No  Fascia: No Tendon: No Tendon: No Muscle: No Muscle: No Joint: No Joint: No Bone: No Bone: No Large (67-100%) Large (67-100%) N/A Epithelialization: N/A Chemical Cauterization N/A Procedures Performed: Treatment Notes Electronic Signature(s) Signed: 03/27/2022 8:19:19 AM By: [name] Maudlin MD FACS Entered By: [name] on 03/27/2022 08:19:19 -------------------------------------------------------------------------------- Multi-Disciplinary Care Plan Details Patient Name: Date of Service: [name], [name] 03/27/2022 8:00 A M Medical Record Number: [id number] Patient Account Number: [id number] Date of Birth/Sex: Treating RN: 1966-01-14 (56 y.o. [name] Primary Care Divonte Senger: [name] Other Clinician: Referring Sheriff Rodenberg: Treating Berdell Nevitt/Extender: [name] in Treatment: 1 Active Inactive Wound/Skin Impairment Nursing Diagnoses: Knowledge deficit related to ulceration/compromised skin integrity Goals: Patient/caregiver will verbalize understanding of skin care regimen Date Initiated: 03/20/2022 Target Resolution Date: 04/27/2022 Goal Status: Active Interventions: Assess patient/caregiver ability to perform ulcer/skin care regimen upon admission and as needed Treatment  Activities: Skin care regimen initiated : 03/20/2022 Notes: Electronic Signature(s) Signed: 03/27/2022 12:03:05 PM By: [name] Catholic RN Entered By: [name] on 03/27/2022 12:01:21 -------------------------------------------------------------------------------- Pain Assessment Details Patient Name: Date of Service: [name], [name] 03/27/2022 8:00 A M Medical Record Number: [id number] Patient Account Number: [id number] Date of Birth/Sex: Treating RN: 1966/03/11 (56 y.o. [name] Primary Care Anadelia Kintz: [name] Other Clinician: Referring Hisao Doo: Treating Celestial Barnfield/Extender: [name] in Treatment: 1 Active Problems Location of Pain Severity and Description of Pain Patient Has Paino No Site Locations Pain Management and Medication Current Pain Management: Electronic Signature(s) Signed: 03/27/2022 12:03:05 PM By: [name] Catholic RN Entered By: [name] on 03/27/2022 08:04:11 -------------------------------------------------------------------------------- Patient/Caregiver Education Details Patient Name: Date of Service: [name], [name] RRY J. 7/31/2023andnbsp8:00 [place] Record Number: [id number] Patient Account Number: [id number] Date of Birth/Gender: Treating RN: September 25, 1965 (56 y.o. [name] Primary Care Physician: [name] Other Clinician: Referring Physician: Treating Physician/Extender: [name] in Treatment: 1 Education Assessment Education Provided To: Patient Education Topics Provided Wound/Skin Impairment: Methods: Explain/Verbal Responses: Return demonstration correctly Electronic Signature(s) Signed: 03/27/2022 12:03:05 PM By: [name] Catholic RN Entered By: [name] on 03/27/2022 12:01:33 -------------------------------------------------------------------------------- Wound Assessment Details Patient Name: Date of  Service: [name], [place]. 03/27/2022 8:00 A M Medical Record Number: [id number] Patient Account Number: [id number] Date of Birth/Sex: Treating RN: 04/03/1966 (56 y.o. [name] Primary Care Sherrita Riederer: [name] Other Clinician: Referring Alecea Trego: Treating Cj Edgell/Extender: [name] in Treatment: 1 Wound Status Wound Number: 1 Primary Etiology: Abscess Wound Location: Abdomen - midline Wound Status: Healed - Epithelialized Wounding Event: Gradually Appeared Comorbid History: Hypertension, Type II Diabetes Date Acquired: 02/08/2022 Weeks Of Treatment: 1 Clustered Wound: Yes Photos Wound Measurements Length: (cm) Width: (cm) Depth: (cm) Area: (cm) Volume: (cm) 0 % Reduction in Area: 100% 0 % Reduction in Volume: 100% 0 Epithelialization: Large (67-100%) 0 Tunneling: No 0 Undermining: No Wound Description Classification: Partial Thickness Exudate Amount: Medium Exudate Type: Serous Exudate Color: amber Foul Odor After Cleansing: No Slough/Fibrino No Wound Bed Granulation Amount: None Present (0%) Exposed Structure Necrotic Amount: None Present (0%) Fascia Exposed: No Fat Layer (Subcutaneous Tissue) Exposed: No Tendon Exposed: No Muscle Exposed: No Joint Exposed: No Bone Exposed: No Treatment Notes Wound #1 (Abdomen - midline) Cleanser Peri-Wound Care Topical Primary Dressing Secondary Dressing Secured With Compression Wrap Compression Stockings Add-Ons Electronic Signature(s) Signed: 03/27/2022 12:03:05 PM By: [name] Catholic RN Entered By: [name] on 03/27/2022 08:18:55 -------------------------------------------------------------------------------- Wound Assessment Details Patient Name:  Date of Service: [name], [name] 03/27/2022 8:00 A M Medical Record Number: [id number] Patient Account Number: [id number] Date of Birth/Sex: Treating RN: 01-Aug-1966 (56 y.o. [name] Primary  Care [place]: [name] Other Clinician: Referring Adonijah Baena: Treating Juliette Standre/Extender: [name] in Treatment: 1 Wound Status Wound Number: 2 Primary Etiology: T be determined o Wound Location: Left Abdomen - midline Wound Status: Open Wounding Event: Gradually Appeared Comorbid History: Hypertension, Type II Diabetes Date Acquired: 03/27/2022 Weeks Of Treatment: 0 Clustered Wound: No Photos Wound Measurements Length: (cm) 0.1 Width: (cm) 0.1 Depth: (cm) 0.1 Area: (cm) 0.008 Volume: (cm) 0.001 % Reduction in Area: 0% % Reduction in Volume: 0% Epithelialization: Large (67-100%) Tunneling: No Undermining: No Wound Description Classification: Partial Thickness Exudate Amount: Medium Exudate Type: Serosanguineous Exudate Color: red, brown Foul Odor After Cleansing: No Slough/Fibrino Yes Wound Bed Granulation Amount: Medium (34-66%) Exposed Structure Granulation Quality: Pink Fascia Exposed: No Necrotic Amount: Medium (34-66%) Fat Layer (Subcutaneous Tissue) Exposed: Yes Necrotic Quality: Adherent Slough Tendon Exposed: No Muscle Exposed: No Joint Exposed: No Bone Exposed: No Treatment Notes Wound #2 (Abdomen - midline) Wound Laterality: Left Cleanser Soap and Water Discharge Instruction: May shower and wash wound with dial antibacterial soap and water prior to dressing change. Wound Cleanser Discharge Instruction: Cleanse the wound with wound cleanser prior to applying a clean dressing using gauze sponges, not tissue or cotton balls. Peri-Wound Care Topical Mupirocin Ointment Discharge Instruction: Apply Mupirocin (Bactroban) as instructed Primary Dressing Secondary Dressing Bordered Gauze, 2x2 in Discharge Instruction: Apply over primary dressing as directed. Secured With Compression Wrap Compression Stockings Environmental education officer) Signed: 03/27/2022 12:03:05 PM By: [name] Catholic RN Entered  By: [name] on 03/27/2022 08:14:22 -------------------------------------------------------------------------------- Vitals Details Patient Name: Date of Service: [name], [name] 03/27/2022 8:00 A M Medical Record Number: [id number] Patient Account Number: [id number] Date of Birth/Sex: Treating RN: [birth year] (56 y.o. [name] Primary Care Caydyn Sprung: [name] Other Clinician: Referring Selvin Yun: Treating Shuronda Santino/Extender: [name] in Treatment: 1 Vital Signs Time Taken: 08:00 Temperature (F): 97.6 Height (in): 73 Pulse (bpm): 57 Weight (lbs): 233 Respiratory Rate (breaths/min): 16 Body Mass Index (BMI): 30.7 Blood Pressure (mmHg): 138/82 Reference Range: 80 - 120 mg / dl Electronic Signature(s) Signed: 03/27/2022 12:03:05 PM By: [name] Catholic RN Entered By: [name] on 03/27/2022 08:04:04

## 2022-03-27 NOTE — Telephone Encounter (Signed)
Okay.  Thanks.
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
## 2022-03-29 NOTE — Telephone Encounter (Signed)
MD placed referral on 03/27/22.Marland Kitchen Pt informed it could take up to 5-7 days on referral../lmb

## 2022-03-29 NOTE — Telephone Encounter (Signed)
[facility] has called back regarding referral - he is asking when this will be sent back to them. Fax is [phone]

## 2022-03-31 ENCOUNTER — Telehealth: Payer: Self-pay | Admitting: Internal Medicine

## 2022-03-31 DIAGNOSIS — S7410XA Injury of femoral nerve at hip and thigh level, unspecified leg, initial encounter: Secondary | ICD-10-CM | POA: Diagnosis not present

## 2022-03-31 DIAGNOSIS — M6281 Muscle weakness (generalized): Secondary | ICD-10-CM | POA: Diagnosis not present

## 2022-03-31 NOTE — Telephone Encounter (Signed)
[name] from [name] is requesting written orders be faxed to [phone]. She is requesting orders for Occupational Therapy. Twice a week for maybe 8-12 weeks.

## 2022-04-02 NOTE — Telephone Encounter (Signed)
Please do.  Thanks.

## 2022-04-03 ENCOUNTER — Encounter (HOSPITAL_BASED_OUTPATIENT_CLINIC_OR_DEPARTMENT_OTHER): Payer: BC Managed Care – PPO | Attending: General Surgery | Admitting: General Surgery

## 2022-04-03 DIAGNOSIS — W3400XS Accidental discharge from unspecified firearms or gun, sequela: Secondary | ICD-10-CM | POA: Diagnosis not present

## 2022-04-03 DIAGNOSIS — N183 Chronic kidney disease, stage 3 unspecified: Secondary | ICD-10-CM | POA: Insufficient documentation

## 2022-04-03 DIAGNOSIS — S31609S Unspecified open wound of abdominal wall, unspecified quadrant with penetration into peritoneal cavity, sequela: Secondary | ICD-10-CM | POA: Insufficient documentation

## 2022-04-03 DIAGNOSIS — I129 Hypertensive chronic kidney disease with stage 1 through stage 4 chronic kidney disease, or unspecified chronic kidney disease: Secondary | ICD-10-CM | POA: Diagnosis not present

## 2022-04-03 DIAGNOSIS — E1169 Type 2 diabetes mellitus with other specified complication: Secondary | ICD-10-CM | POA: Diagnosis not present

## 2022-04-03 DIAGNOSIS — E1122 Type 2 diabetes mellitus with diabetic chronic kidney disease: Secondary | ICD-10-CM | POA: Diagnosis not present

## 2022-04-03 NOTE — Progress Notes (Signed)
[name], [name] ([id number]) Visit Report for 04/03/2022 Arrival Information Details Patient Name: Date of Service: [name], [name] 04/03/2022 1:15 PM Medical Record Number: [id number] Patient Account Number: [id number] Date of Birth/Sex: Treating RN: 1965/10/09 (56 y.o. [name] Primary Care [name]: [name] Other Clinician: Referring [name]: Treating [name] in Treatment: 2 Visit Information History Since Last Visit All ordered tests and consults were completed: Yes Patient Arrived: Ambulatory Added or deleted any medications: No Arrival Time: 13:43 Any new allergies or adverse reactions: No Accompanied By: self Had a fall or experienced change in No Transfer Assistance: None activities of daily living that may affect Patient Identification Verified: Yes risk of falls: Secondary Verification Process Completed: Yes Signs or symptoms of abuse/neglect since last visito No Patient Requires Transmission-Based Precautions: No Hospitalized since last visit: No Patient Has Alerts: No Implantable device outside of the clinic excluding No cellular tissue based products placed in the center since last visit: Has Dressing in Place as Prescribed: Yes Pain Present Now: No Electronic Signature(s) Signed: 04/03/2022 4:42:33 PM By: [name] East RN Entered By: [name] on 04/03/2022 13:44:14 -------------------------------------------------------------------------------- Clinic Level of Care Assessment Details Patient Name: Date of Service: [name], [name] 04/03/2022 1:15 PM Medical Record Number: [id number] Patient Account Number: [id number] Date of Birth/Sex: Treating RN: November 05, 1965 (56 y.o. [name] Primary Care [name]: [name] Other Clinician: Referring [name]: Treating [name]/Extender: [name] in Treatment: 2 Clinic Level of Care  Assessment Items TOOL 4 Quantity Score X- 1 0 Use when only an EandM is performed on FOLLOW-UP visit ASSESSMENTS - Nursing Assessment / Reassessment X- 1 10 Reassessment of Co-morbidities (includes updates in patient status) X- 1 5 Reassessment of Adherence to Treatment Plan ASSESSMENTS - Wound and Skin A ssessment / Reassessment X - Simple Wound Assessment / Reassessment - one wound 1 5 '[]'$  - 0 Complex Wound Assessment / Reassessment - multiple wounds '[]'$  - 0 Dermatologic / Skin Assessment (not related to wound area) ASSESSMENTS - Focused Assessment '[]'$  - 0 Circumferential Edema Measurements - multi extremities '[]'$  - 0 Nutritional Assessment / Counseling / Intervention '[]'$  - 0 Lower Extremity Assessment (monofilament, tuning fork, pulses) '[]'$  - 0 Peripheral Arterial Disease Assessment (using hand held doppler) ASSESSMENTS - Ostomy and/or Continence Assessment and Care '[]'$  - 0 Incontinence Assessment and Management '[]'$  - 0 Ostomy Care Assessment and Management (repouching, etc.) PROCESS - Coordination of Care X - Simple Patient / Family Education for ongoing care 1 15 '[]'$  - 0 Complex (extensive) Patient / Family Education for ongoing care X- 1 10 Staff obtains Programmer, systems, Records, T Results / Process Orders est X- 1 10 Staff telephones HHA, Nursing Homes / Clarify orders / etc '[]'$  - 0 Routine Transfer to another Facility (non-emergent condition) '[]'$  - 0 Routine Hospital Admission (non-emergent condition) '[]'$  - 0 New Admissions / Biomedical engineer / Ordering NPWT Apligraf, etc. , '[]'$  - 0 Emergency Hospital Admission (emergent condition) X- 1 10 Simple Discharge Coordination '[]'$  - 0 Complex (extensive) Discharge Coordination PROCESS - Special Needs '[]'$  - 0 Pediatric / Minor Patient Management '[]'$  - 0 Isolation Patient Management '[]'$  - 0 Hearing / Language / Visual special needs '[]'$  - 0 Assessment of Community assistance (transportation, D/C planning, etc.) '[]'$  -  0 Additional assistance / Altered mentation '[]'$  - 0 Support Surface(s) Assessment (bed, cushion, seat, etc.) INTERVENTIONS - Wound Cleansing / Measurement X - Simple Wound Cleansing - one wound  1 5 '[]'$  - 0 Complex Wound Cleansing - multiple wounds X- 1 5 Wound Imaging (photographs - any number of wounds) '[]'$  - 0 Wound Tracing (instead of photographs) X- 1 5 Simple Wound Measurement - one wound '[]'$  - 0 Complex Wound Measurement - multiple wounds INTERVENTIONS - Wound Dressings '[]'$  - 0 Small Wound Dressing one or multiple wounds '[]'$  - 0 Medium Wound Dressing one or multiple wounds '[]'$  - 0 Large Wound Dressing one or multiple wounds '[]'$  - 0 Application of Medications - topical '[]'$  - 0 Application of Medications - injection INTERVENTIONS - Miscellaneous '[]'$  - 0 External ear exam '[]'$  - 0 Specimen Collection (cultures, biopsies, blood, body fluids, etc.) '[]'$  - 0 Specimen(s) / Culture(s) sent or taken to Lab for analysis '[]'$  - 0 Patient Transfer (multiple staff / Civil Service fast streamer / Similar devices) '[]'$  - 0 Simple Staple / Suture removal (25 or less) '[]'$  - 0 Complex Staple / Suture removal (26 or more) '[]'$  - 0 Hypo / Hyperglycemic Management (close monitor of Blood Glucose) '[]'$  - 0 Ankle / Brachial Index (ABI) - do not check if billed separately X- 1 5 Vital Signs Has the patient been seen at the hospital within the last three years: Yes Total Score: 85 Level Of Care: New/Established - Level 3 Electronic Signature(s) Signed: 04/03/2022 5:43:41 PM By: [name] Catholic RN Entered By: [name] on 04/03/2022 17:42:45 -------------------------------------------------------------------------------- Encounter Discharge Information Details Patient Name: Date of Service: [name], [name] RRY J. 04/03/2022 1:15 PM Medical Record Number: [id number] Patient Account Number: [id number] Date of Birth/Sex: Treating RN: [birth year] (56 y.o. [name] Primary Care [name]: [name] Other  Clinician: Referring [name]: Treating [name]/Extender: [name] in Treatment: 2 Encounter Discharge Information Items Discharge Condition: Stable Ambulatory Status: Ambulatory Discharge Destination: Home Transportation: Private Auto Accompanied By: self Schedule Follow-up Appointment: Yes Clinical Summary of Care: Patient Declined Electronic Signature(s) Signed: 04/03/2022 5:43:41 PM By: [name] Catholic RN Entered By: [name] on 04/03/2022 17:43:19 -------------------------------------------------------------------------------- Lower Extremity Assessment Details Patient Name: Date of Service: [name], [name] 04/03/2022 1:15 PM Medical Record Number: [id number] Patient Account Number: [id number] Date of Birth/Sex: Treating RN: 1966-06-10 (56 y.o. [name] Primary Care Laron Boorman: [name] Other Clinician: Referring Rahkim Rabalais: Treating Moo Gravley/Extender: [name] in Treatment: 2 Electronic Signature(s) Signed: 04/03/2022 4:42:33 PM By: [name] East RN Signed: 04/03/2022 5:43:41 PM By: [name] Catholic RN Entered By: [name] on 04/03/2022 13:46:10 -------------------------------------------------------------------------------- Multi Wound Chart Details Patient Name: Date of Service: [name], [place]. 04/03/2022 1:15 PM Medical Record Number: [id number] Patient Account Number: [id number] Date of Birth/Sex: Treating RN: 1965-11-02 (56 y.o. [name] Primary Care Alveria Mcglaughlin: [name] Other Clinician: Referring Clarrisa Kaylor: Treating Gurinder Toral/Extender: [name] in Treatment: 2 Vital Signs Height(in): 73 Pulse(bpm): 60 Weight(lbs): 233 Blood Pressure(mmHg): 161/93 Body Mass Index(BMI): 30.7 Temperature(F): 98.2 Respiratory Rate(breaths/min): 18 Photos: [2:No Photos Left Abdomen - midline] [N/A:N/A N/A] Wound Location:  [2:Gradually Appeared] [N/A:N/A] Wounding Event: [2:T be determined o] [N/A:N/A] Primary Etiology: [2:Hypertension, Type II Diabetes] [N/A:N/A] Comorbid History: [2:03/27/2022] [N/A:N/A] Date Acquired: [2:1] [N/A:N/A] Weeks of Treatment: [2:Healed - Epithelialized] [N/A:N/A] Wound Status: [2:No] [N/A:N/A] Wound Recurrence: [2:0x0x0] [N/A:N/A] Measurements L x W x D (cm) [2:0] [N/A:N/A] A (cm) : rea [2:0] [N/A:N/A] Volume (cm) : [2:100.00%] [N/A:N/A] % Reduction in A rea: [2:100.00%] [N/A:N/A] % Reduction in Volume: [2:Partial Thickness] [N/A:N/A] Classification: [2:None Present] [N/A:N/A] Exudate A mount: [2:Flat and Intact] [N/A:N/A] Wound  Margin: [2:None Present (0%)] [N/A:N/A] Granulation A mount: [2:None Present (0%)] [N/A:N/A] Necrotic A mount: [2:Fascia: No] [N/A:N/A] Exposed Structures: [2:Fat Layer (Subcutaneous Tissue): No Tendon: No Muscle: No Joint: No Bone: No Large (67-100%)] [N/A:N/A] Treatment Notes Electronic Signature(s) Signed: 04/03/2022 2:03:16 PM By: [name] Maudlin MD FACS Signed: 04/03/2022 5:43:41 PM By: [name] Catholic RN Entered By: [name] on 04/03/2022 14:03:15 -------------------------------------------------------------------------------- Pain Assessment Details Patient Name: Date of Service: [name], [place]. 04/03/2022 1:15 PM Medical Record Number: [id number] Patient Account Number: [id number] Date of Birth/Sex: Treating RN: September 05, 1965 (56 y.o. [name] Primary Care Jarian Longoria: [name] Other Clinician: Referring Jaynia Fendley: Treating Horice Carrero/Extender: [name] in Treatment: 2 Active Problems Location of Pain Severity and Description of Pain Patient Has Paino No Site Locations Pain Management and Medication Current Pain Management: Electronic Signature(s) Signed: 04/03/2022 4:42:33 PM By: [name] East RN Signed: 04/03/2022 5:43:41 PM By: [name] Catholic RN Entered By: [name]  on 04/03/2022 13:46:05 -------------------------------------------------------------------------------- Patient/Caregiver Education Details Patient Name: Date of Service: [name], [name] 8/7/2023andnbsp1:15 PM Medical Record Number: [id number] Patient Account Number: [id number] Date of Birth/Gender: Treating RN: 07-16-1966 (56 y.o. [name] Primary Care Physician: [name] Other Clinician: Referring Physician: Treating Physician/Extender: [name] in Treatment: 2 Education Assessment Education Provided To: Patient Education Topics Provided Wound/Skin Impairment: Methods: Explain/Verbal Responses: Return demonstration correctly Electronic Signature(s) Signed: 04/03/2022 5:43:41 PM By: [name] Catholic RN Entered By: [name] on 04/03/2022 17:39:42 -------------------------------------------------------------------------------- Wound Assessment Details Patient Name: Date of Service: [name], [place]. 04/03/2022 1:15 PM Medical Record Number: [id number] Patient Account Number: [id number] Date of Birth/Sex: Treating RN: 1966/01/06 (56 y.o. [name] Primary Care Mirza Fessel: [name] Other Clinician: Referring Letricia Krinsky: Treating Jedidiah Demartini/Extender: [name] in Treatment: 2 Wound Status Wound Number: 2 Primary Etiology: T be determined o Wound Location: Left Abdomen - midline Wound Status: Healed - Epithelialized Wounding Event: Gradually Appeared Comorbid History: Hypertension, Type II Diabetes Date Acquired: 03/27/2022 Weeks Of Treatment: 1 Clustered Wound: No Wound Measurements Length: (cm) Width: (cm) Depth: (cm) Area: (cm) Volume: (cm) 0 % Reduction in Area: 100% 0 % Reduction in Volume: 100% 0 Epithelialization: Large (67-100%) 0 Tunneling: No 0 Undermining: No Wound Description Classification: Partial Thickness Wound Margin: Flat and  Intact Exudate Amount: None Present Foul Odor After Cleansing: No Slough/Fibrino No Wound Bed Granulation Amount: None Present (0%) Exposed Structure Necrotic Amount: None Present (0%) Fascia Exposed: No Fat Layer (Subcutaneous Tissue) Exposed: No Tendon Exposed: No Muscle Exposed: No Joint Exposed: No Bone Exposed: No Treatment Notes Wound #2 (Abdomen - midline) Wound Laterality: Left Cleanser Peri-Wound Care Topical Primary Dressing Secondary Dressing Secured With Compression Wrap Compression Stockings Add-Ons Electronic Signature(s) Signed: 04/03/2022 4:42:33 PM By: [name] East RN Signed: 04/03/2022 5:43:41 PM By: [name] Catholic RN Entered By: [name] on 04/03/2022 14:01:21 -------------------------------------------------------------------------------- [place] Details Patient Name: Date of Service: Gholson, [name] RRY J. 04/03/2022 1:15 PM Medical Record Number: [id number] Patient Account Number: [id number] Date of Birth/Sex: Treating RN: Oct 13, 1965 (56 y.o. [name] Primary Care Jamaya Sleeth: [name] Other Clinician: Referring Andranik Jeune: Treating Tynisa Vohs/Extender: [name] in Treatment: 2 Vital Signs Time Taken: 13:44 Temperature (F): 98.2 Height (in): 73 Pulse (bpm): 60 Weight (lbs): 233 Respiratory Rate (breaths/min): 18 Body Mass Index (BMI): 30.7 Blood Pressure (mmHg): 161/93 Reference Range: 80 - 120 mg / dl Electronic Signature(s) Signed: 04/03/2022 4:42:33 PM By: [name] East RN  Entered By: [name] on 04/03/2022 13:45:57
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
## 2022-04-04 NOTE — Telephone Encounter (Signed)
Have order been trying to fax back. No answer @ [phone].Marland KitchenJohny [name]

## 2022-04-04 NOTE — Telephone Encounter (Signed)
Order finally went through received confirmation from [phone].Marland KitchenJohny Chess

## 2022-04-05 ENCOUNTER — Telehealth: Payer: Self-pay | Admitting: Internal Medicine

## 2022-04-05 NOTE — Telephone Encounter (Signed)
[name] from [facility] called and states the pt is requesting orders for OT as well as the PT.   For any questions please call Quillian Quince:  Phone: [phone] Fax: [phone]

## 2022-04-07 NOTE — Telephone Encounter (Signed)
Okay.  Thanks.

## 2022-04-11 DIAGNOSIS — M6281 Muscle weakness (generalized): Secondary | ICD-10-CM | POA: Diagnosis not present

## 2022-04-11 DIAGNOSIS — S7410XA Injury of femoral nerve at hip and thigh level, unspecified leg, initial encounter: Secondary | ICD-10-CM | POA: Diagnosis not present

## 2022-04-14 DIAGNOSIS — S7410XA Injury of femoral nerve at hip and thigh level, unspecified leg, initial encounter: Secondary | ICD-10-CM | POA: Diagnosis not present

## 2022-04-14 DIAGNOSIS — M6281 Muscle weakness (generalized): Secondary | ICD-10-CM | POA: Diagnosis not present

## 2022-04-19 DIAGNOSIS — S7410XA Injury of femoral nerve at hip and thigh level, unspecified leg, initial encounter: Secondary | ICD-10-CM | POA: Diagnosis not present

## 2022-04-19 DIAGNOSIS — M6281 Muscle weakness (generalized): Secondary | ICD-10-CM | POA: Diagnosis not present

## 2022-04-25 ENCOUNTER — Ambulatory Visit (INDEPENDENT_AMBULATORY_CARE_PROVIDER_SITE_OTHER): Payer: BC Managed Care – PPO | Admitting: Gastroenterology

## 2022-04-25 ENCOUNTER — Encounter: Payer: Self-pay | Admitting: Gastroenterology

## 2022-04-25 VITALS — BP 130/80 | HR 70 | Ht 73.0 in | Wt 237.6 lb

## 2022-04-25 DIAGNOSIS — M6281 Muscle weakness (generalized): Secondary | ICD-10-CM | POA: Diagnosis not present

## 2022-04-25 DIAGNOSIS — R194 Change in bowel habit: Secondary | ICD-10-CM

## 2022-04-25 DIAGNOSIS — Z9049 Acquired absence of other specified parts of digestive tract: Secondary | ICD-10-CM

## 2022-04-25 DIAGNOSIS — S7410XA Injury of femoral nerve at hip and thigh level, unspecified leg, initial encounter: Secondary | ICD-10-CM | POA: Diagnosis not present

## 2022-04-25 DIAGNOSIS — K5904 Chronic idiopathic constipation: Secondary | ICD-10-CM

## 2022-04-25 NOTE — Patient Instructions (Signed)
_______________________________________________________  If you are age 54 or older, your body mass index should be between 23-30. Your Body mass index is 31.35 kg/m. If this is out of the aforementioned range listed, please consider follow up with your Primary Care Provider.  If you are age 32 or younger, your body mass index should be between 19-25. Your Body mass index is 31.35 kg/m. If this is out of the aformentioned range listed, please consider follow up with your Primary Care Provider.   ________________________________________________________  The [facility] providers would like to encourage you to use [facility] to communicate with providers for non-urgent requests or questions.  Due to long hold times on the telephone, sending your provider a message by [facility] may be a faster and more efficient way to get a response.  Please allow 48 business hours for a response.  Please remember that this is for non-urgent requests.  _______________________________________________________  May continue Miralax 1-2 times a day.  Continue Fibercon 1-2 times a day.  Follow up as needed.  You will be due for a recall colonoscopy in 2028. We will send you a reminder in the mail when it gets closer to that time.   It was a pleasure to see you today!  Thank you for trusting me with your gastrointestinal care!

## 2022-04-28 ENCOUNTER — Encounter: Payer: Self-pay | Admitting: Gastroenterology

## 2022-04-28 DIAGNOSIS — M6281 Muscle weakness (generalized): Secondary | ICD-10-CM | POA: Diagnosis not present

## 2022-04-28 DIAGNOSIS — S7410XA Injury of femoral nerve at hip and thigh level, unspecified leg, initial encounter: Secondary | ICD-10-CM | POA: Diagnosis not present
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
## 2022-05-02 DIAGNOSIS — M6281 Muscle weakness (generalized): Secondary | ICD-10-CM | POA: Diagnosis not present

## 2022-05-02 DIAGNOSIS — S7410XA Injury of femoral nerve at hip and thigh level, unspecified leg, initial encounter: Secondary | ICD-10-CM | POA: Diagnosis not present

## 2022-05-05 ENCOUNTER — Encounter: Payer: Self-pay | Admitting: Hematology & Oncology

## 2022-05-05 ENCOUNTER — Inpatient Hospital Stay (HOSPITAL_BASED_OUTPATIENT_CLINIC_OR_DEPARTMENT_OTHER): Payer: BC Managed Care – PPO | Admitting: Hematology & Oncology

## 2022-05-05 ENCOUNTER — Inpatient Hospital Stay: Payer: BC Managed Care – PPO | Attending: Hematology & Oncology

## 2022-05-05 ENCOUNTER — Other Ambulatory Visit: Payer: Self-pay

## 2022-05-05 VITALS — BP 139/78 | HR 63 | Temp 98.4°F | Resp 18 | Ht 73.0 in | Wt 237.4 lb

## 2022-05-05 DIAGNOSIS — D72820 Lymphocytosis (symptomatic): Secondary | ICD-10-CM | POA: Diagnosis not present

## 2022-05-05 LAB — CMP (CANCER CENTER ONLY)
ALT: 13 U/L (ref 0–44)
AST: 22 U/L (ref 15–41)
Albumin: 4.7 g/dL (ref 3.5–5.0)
Alkaline Phosphatase: 45 U/L (ref 38–126)
Anion gap: 6 (ref 5–15)
BUN: 24 mg/dL — ABNORMAL HIGH (ref 6–20)
CO2: 30 mmol/L (ref 22–32)
Calcium: 10.3 mg/dL (ref 8.9–10.3)
Chloride: 102 mmol/L (ref 98–111)
Creatinine: 1.74 mg/dL — ABNORMAL HIGH (ref 0.61–1.24)
GFR, Estimated: 46 mL/min — ABNORMAL LOW (ref >60)
Glucose, Bld: 83 mg/dL (ref 70–99)
Potassium: 4 mmol/L (ref 3.5–5.1)
Sodium: 138 mmol/L (ref 135–145)
Total Bilirubin: 1.7 mg/dL — ABNORMAL HIGH (ref 0.3–1.2)
Total Protein: 7.9 g/dL (ref 6.5–8.1)

## 2022-05-05 LAB — CBC WITH DIFFERENTIAL (CANCER CENTER ONLY)
Abs Immature Granulocytes: 0.02 10*3/uL (ref 0.00–0.07)
Basophils Absolute: 0 10*3/uL (ref 0.0–0.1)
Basophils Relative: 0 %
Eosinophils Absolute: 0.1 10*3/uL (ref 0.0–0.5)
Eosinophils Relative: 1 %
HCT: 41.5 % (ref 39.0–52.0)
Hemoglobin: 14.1 g/dL (ref 13.0–17.0)
Immature Granulocytes: 0 %
Lymphocytes Relative: 54 %
Lymphs Abs: 2.7 10*3/uL (ref 0.7–4.0)
MCH: 31.8 pg (ref 26.0–34.0)
MCHC: 34 g/dL (ref 30.0–36.0)
MCV: 93.5 fL (ref 80.0–100.0)
Monocytes Absolute: 0.4 10*3/uL (ref 0.1–1.0)
Monocytes Relative: 8 %
Neutro Abs: 1.8 10*3/uL (ref 1.7–7.7)
Neutrophils Relative %: 37 %
Platelet Count: 187 10*3/uL (ref 150–400)
RBC: 4.44 MIL/uL (ref 4.22–5.81)
RDW: 13.2 % (ref 11.5–15.5)
WBC Count: 5 10*3/uL (ref 4.0–10.5)
nRBC: 0 % (ref 0.0–0.2)

## 2022-05-05 LAB — SAVE SMEAR(SSMR), FOR PROVIDER SLIDE REVIEW

## 2022-05-05 LAB — LACTATE DEHYDROGENASE: LDH: 155 U/L (ref 98–192)

## 2022-05-05 NOTE — Progress Notes (Signed)
Hematology and Oncology Follow Up Visit  [name] [id number] 1966/06/13 56 y.o. 05/05/2022   Principle Diagnosis:  Chronic lymphocytosis  Current Therapy:   Observation     Interim History:  [name] is back for follow-up.  He is doing pretty well.  He is still getting over surgery that he had.  He is walking a bit better.  He has had no issues since we last saw him.  We actually last saw him back in February.  He has had no problems with COVID.  He has had no problems with nausea or vomiting.  He has had no rashes.  He has had no change in bowel or bladder habits.  He has had no cough or shortness of breath.  Overall, I was his performance status is probably ECOG 1.    Medications:  Current Outpatient Medications:    b complex vitamins capsule, Take 1 capsule by mouth daily., Disp: 100 capsule, Rfl: 3   Cholecalciferol (VITAMIN D3) 50 MCG (2000 UT) capsule, Take 1 capsule (2,000 Units total) by mouth daily., Disp: 100 capsule, Rfl: 3   losartan (COZAAR) 100 MG tablet, TAKE 1 TABLET(100 MG) BY MOUTH DAILY, Disp: 90 tablet, Rfl: 2   polycarbophil (FIBERCON) 625 MG tablet, Take 625 mg by mouth daily., Disp: , Rfl:    polyethylene glycol (MIRALAX / GLYCOLAX) 17 g packet, Take 17 g by mouth 2 (two) times daily., Disp: , Rfl:   Allergies:  Allergies  Allergen Reactions   Iodine Swelling   [place] Clam (M. Mercenaria) Skin Test Hives and Swelling         Shellfish Allergy Swelling   Hctz [Hydrochlorothiazide] Photosensitivity   Ace Inhibitors Other (See Comments)    Unknown Reaction   Valsartan Other (See Comments)    Past Medical History, Surgical history, Social history, and Family History were reviewed and updated.  Review of Systems: Review of Systems  Constitutional: Negative.   HENT:  Negative.    Eyes: Negative.   Respiratory: Negative.    Cardiovascular: Negative.   Gastrointestinal: Negative.   Endocrine: Negative.   Genitourinary: Negative.     Musculoskeletal:  Positive for back pain and gait problem.  Skin: Negative.   Neurological:  Positive for gait problem.  Hematological: Negative.   Psychiatric/Behavioral: Negative.      Physical Exam:  height is '6\' 1"'$  (1.854 m) and weight is 237 lb 6.4 oz (107.7 kg). His oral temperature is 98.4 F (36.9 C). His blood pressure is 139/78 and his pulse is 63. His respiration is 18 and oxygen saturation is 100%.   Wt Readings from Last 3 Encounters:  05/05/22 237 lb 6.4 oz (107.7 kg)  04/25/22 237 lb 9.6 oz (107.8 kg)  02/27/22 237 lb (107.5 kg)    Physical Exam Vitals reviewed.  HENT:     Head: Normocephalic and atraumatic.  Eyes:     Pupils: Pupils are equal, round, and reactive to light.  Cardiovascular:     Rate and Rhythm: Normal rate and regular rhythm.     Heart sounds: Normal heart sounds.  Pulmonary:     Effort: Pulmonary effort is normal.     Breath sounds: Normal breath sounds.  Abdominal:     General: Bowel sounds are normal.     Palpations: Abdomen is soft.     Comments: Abdominal exam shows multiple laparotomy scars.  He has no obvious abdominal mass.  There is no palpable liver or spleen tip.  Musculoskeletal:  General: No tenderness or deformity. Normal range of motion.     Cervical back: Normal range of motion.     Comments: Back exam shows the laminectomy scar in the mid to lower thoracic spine.  This is well-healed.  He has weakness in the right leg.  Lymphadenopathy:     Cervical: No cervical adenopathy.  Skin:    General: Skin is warm and dry.     Findings: No erythema or rash.  Neurological:     Mental Status: He is alert and oriented to person, place, and time.  Psychiatric:        Behavior: Behavior normal.        Thought Content: Thought content normal.        Judgment: Judgment normal.      Lab Results  Component Value Date   WBC 5.0 05/05/2022   HGB 14.1 05/05/2022   HCT 41.5 05/05/2022   MCV 93.5 05/05/2022   PLT 187  05/05/2022     Chemistry      Component Value Date/Time   NA 138 01/03/2022 0920   K 4.5 01/03/2022 0920   CL 101 01/03/2022 0920   CO2 28 01/03/2022 0920   BUN 28 (H) 01/03/2022 0920   CREATININE 1.74 (H) 01/03/2022 0920   CREATININE 1.67 (H) 09/29/2021 0830      Component Value Date/Time   CALCIUM 10.5 01/03/2022 0920   ALKPHOS 50 01/03/2022 0920   AST 33 01/03/2022 0920   AST 28 09/29/2021 0830   ALT 17 01/03/2022 0920   ALT 25 09/29/2021 0830   BILITOT 1.0 01/03/2022 0920   BILITOT 1.0 09/29/2021 0830      Impression and Plan: [name] is a very nice 56 year old African-American male.  He has lymphocytosis.  I looked at his blood smear.  Lymphocytes appear to be mature.  I do not see anything that looks like a hematologic malignancy.  We did do the flow cytometry on his peripheral blood and I thought that would clearly was the diagnostic test to see if there is any hematologic problem.  At this point, I just do not see that we have to see him back in the office.  I just do not see any obvious hematologic issue.  The flow cytometry that he has had before did not show a monoclonal B-cell population.  I really am not worried about the mild lymphocytosis.  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    Volanda Napoleon, MD 9/8/20239:09 AM

## 2022-05-09 ENCOUNTER — Ambulatory Visit: Payer: BC Managed Care – PPO | Admitting: Internal Medicine

## 2022-05-09 ENCOUNTER — Encounter: Payer: Self-pay | Admitting: Internal Medicine

## 2022-05-09 DIAGNOSIS — E669 Obesity, unspecified: Secondary | ICD-10-CM

## 2022-05-09 DIAGNOSIS — M6281 Muscle weakness (generalized): Secondary | ICD-10-CM | POA: Diagnosis not present

## 2022-05-09 DIAGNOSIS — N183 Chronic kidney disease, stage 3 unspecified: Secondary | ICD-10-CM | POA: Diagnosis not present

## 2022-05-09 DIAGNOSIS — E119 Type 2 diabetes mellitus without complications: Secondary | ICD-10-CM

## 2022-05-09 DIAGNOSIS — R29898 Other symptoms and signs involving the musculoskeletal system: Secondary | ICD-10-CM | POA: Diagnosis not present

## 2022-05-09 DIAGNOSIS — S7410XA Injury of femoral nerve at hip and thigh level, unspecified leg, initial encounter: Secondary | ICD-10-CM | POA: Diagnosis not present

## 2022-05-09 DIAGNOSIS — I10 Essential (primary) hypertension: Secondary | ICD-10-CM

## 2022-05-09 DIAGNOSIS — E1169 Type 2 diabetes mellitus with other specified complication: Secondary | ICD-10-CM | POA: Diagnosis not present

## 2022-05-09 NOTE — Assessment & Plan Note (Signed)
In PT 
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
## 2022-05-09 NOTE — Assessment & Plan Note (Signed)
On Prandin

## 2022-05-09 NOTE — Assessment & Plan Note (Signed)
Cont on Losartan °

## 2022-05-09 NOTE — Patient Instructions (Signed)
For a mild COVID-19 case - take zinc 50 mg a day for 1 week, vitamin C 1000 mg daily for 1 week, vitamin D2 50,000 units weekly for 2 months (unless  taking vitamin D daily already), an antioxidant Quercetin 500 mg twice a day for 1 week (if you can get it quick enough). Take Allegra or Benadryl.  Maintain good oral hydration and take Tylenol for high fever.  Call if problems. Isolate for 5 days, then wear a mask for 5 days per CDC.  

## 2022-05-09 NOTE — Assessment & Plan Note (Signed)
Diabetic nephropathy  GFR 37 F/u Dr Hollie Salk Continue with good hydration and current losartan and Synjardy.  Avoid NSAIDs.  Follow-up with nephrology. We may need to reduce his Metformin and losartan dose

## 2022-06-09 DIAGNOSIS — I129 Hypertensive chronic kidney disease with stage 1 through stage 4 chronic kidney disease, or unspecified chronic kidney disease: Secondary | ICD-10-CM | POA: Diagnosis not present

## 2022-06-09 DIAGNOSIS — N1831 Chronic kidney disease, stage 3a: Secondary | ICD-10-CM | POA: Diagnosis not present

## 2022-06-09 DIAGNOSIS — E1129 Type 2 diabetes mellitus with other diabetic kidney complication: Secondary | ICD-10-CM | POA: Diagnosis not present

## 2022-06-09 DIAGNOSIS — R809 Proteinuria, unspecified: Secondary | ICD-10-CM | POA: Diagnosis not present

## 2022-06-30 DIAGNOSIS — S7410XA Injury of femoral nerve at hip and thigh level, unspecified leg, initial encounter: Secondary | ICD-10-CM | POA: Diagnosis not present

## 2022-06-30 DIAGNOSIS — M6281 Muscle weakness (generalized): Secondary | ICD-10-CM | POA: Diagnosis not present

## 2022-07-07 DIAGNOSIS — S7410XA Injury of femoral nerve at hip and thigh level, unspecified leg, initial encounter: Secondary | ICD-10-CM | POA: Diagnosis not present

## 2022-07-07 DIAGNOSIS — M6281 Muscle weakness (generalized): Secondary | ICD-10-CM | POA: Diagnosis not present

## 2022-07-18 DIAGNOSIS — M6281 Muscle weakness (generalized): Secondary | ICD-10-CM | POA: Diagnosis not present

## 2022-07-18 DIAGNOSIS — S7410XA Injury of femoral nerve at hip and thigh level, unspecified leg, initial encounter: Secondary | ICD-10-CM | POA: Diagnosis not present

## 2022-08-03 DIAGNOSIS — M6281 Muscle weakness (generalized): Secondary | ICD-10-CM | POA: Diagnosis not present

## 2022-08-03 DIAGNOSIS — S7410XA Injury of femoral nerve at hip and thigh level, unspecified leg, initial encounter: Secondary | ICD-10-CM | POA: Diagnosis not present

## 2022-09-07 ENCOUNTER — Ambulatory Visit: Payer: BC Managed Care – PPO | Admitting: Internal Medicine

## 2022-09-07 ENCOUNTER — Encounter: Payer: Self-pay | Admitting: Internal Medicine

## 2022-09-07 VITALS — BP 120/72 | HR 65 | Temp 98.5°F | Ht 73.0 in | Wt 244.0 lb

## 2022-09-07 DIAGNOSIS — E669 Obesity, unspecified: Secondary | ICD-10-CM | POA: Diagnosis not present

## 2022-09-07 DIAGNOSIS — N183 Chronic kidney disease, stage 3 unspecified: Secondary | ICD-10-CM | POA: Diagnosis not present

## 2022-09-07 DIAGNOSIS — M6281 Muscle weakness (generalized): Secondary | ICD-10-CM | POA: Diagnosis not present

## 2022-09-07 DIAGNOSIS — E1169 Type 2 diabetes mellitus with other specified complication: Secondary | ICD-10-CM

## 2022-09-07 DIAGNOSIS — S7410XA Injury of femoral nerve at hip and thigh level, unspecified leg, initial encounter: Secondary | ICD-10-CM | POA: Diagnosis not present

## 2022-09-07 DIAGNOSIS — I10 Essential (primary) hypertension: Secondary | ICD-10-CM | POA: Diagnosis not present

## 2022-09-07 DIAGNOSIS — F4310 Post-traumatic stress disorder, unspecified: Secondary | ICD-10-CM | POA: Diagnosis not present

## 2022-09-07 LAB — COMPREHENSIVE METABOLIC PANEL
ALT: 16 U/L (ref 0–53)
AST: 27 U/L (ref 0–37)
Albumin: 4.8 g/dL (ref 3.5–5.2)
Alkaline Phosphatase: 59 U/L (ref 39–117)
BUN: 23 mg/dL (ref 6–23)
CO2: 29 mEq/L (ref 19–32)
Calcium: 10.7 mg/dL — ABNORMAL HIGH (ref 8.4–10.5)
Chloride: 104 mEq/L (ref 96–112)
Creatinine, Ser: 1.67 mg/dL — ABNORMAL HIGH (ref 0.40–1.50)
GFR: 45.97 mL/min — ABNORMAL LOW (ref >60.00)
Glucose, Bld: 85 mg/dL (ref 70–99)
Potassium: 4.7 mEq/L (ref 3.5–5.1)
Sodium: 142 mEq/L (ref 135–145)
Total Bilirubin: 1.5 mg/dL — ABNORMAL HIGH (ref 0.2–1.2)
Total Protein: 8.4 g/dL — ABNORMAL HIGH (ref 6.0–8.3)

## 2022-09-07 LAB — HEMOGLOBIN A1C: Hgb A1c MFr Bld: 6 % (ref 4.6–6.5)
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
## 2022-09-10 ENCOUNTER — Encounter: Payer: Self-pay | Admitting: Internal Medicine

## 2022-09-10 DIAGNOSIS — E1169 Type 2 diabetes mellitus with other specified complication: Secondary | ICD-10-CM | POA: Insufficient documentation

## 2022-09-10 NOTE — Assessment & Plan Note (Signed)
Continue with good hydration.  Follow-up with Dr. Hollie Salk

## 2022-09-10 NOTE — Assessment & Plan Note (Signed)
  BP Readings from Last 3 Encounters:  09/07/22 120/72  05/09/22 (!) 142/80  05/05/22 139/78
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
## 2022-09-10 NOTE — Assessment & Plan Note (Signed)
Obtain hemoglobin A1c

## 2022-09-13 ENCOUNTER — Encounter: Payer: Self-pay | Admitting: Internal Medicine

## 2022-10-05 ENCOUNTER — Other Ambulatory Visit: Payer: Self-pay | Admitting: Internal Medicine

## 2022-10-05 DIAGNOSIS — M6281 Muscle weakness (generalized): Secondary | ICD-10-CM | POA: Diagnosis not present

## 2022-10-05 DIAGNOSIS — S7410XA Injury of femoral nerve at hip and thigh level, unspecified leg, initial encounter: Secondary | ICD-10-CM | POA: Diagnosis not present

## 2022-10-13 DIAGNOSIS — M6281 Muscle weakness (generalized): Secondary | ICD-10-CM | POA: Diagnosis not present

## 2022-10-13 DIAGNOSIS — S7410XA Injury of femoral nerve at hip and thigh level, unspecified leg, initial encounter: Secondary | ICD-10-CM | POA: Diagnosis not present

## 2022-10-17 DIAGNOSIS — S7410XA Injury of femoral nerve at hip and thigh level, unspecified leg, initial encounter: Secondary | ICD-10-CM | POA: Diagnosis not present

## 2022-10-17 DIAGNOSIS — M6281 Muscle weakness (generalized): Secondary | ICD-10-CM | POA: Diagnosis not present

## 2022-10-26 DIAGNOSIS — S7410XA Injury of femoral nerve at hip and thigh level, unspecified leg, initial encounter: Secondary | ICD-10-CM | POA: Diagnosis not present

## 2022-10-26 DIAGNOSIS — M6281 Muscle weakness (generalized): Secondary | ICD-10-CM | POA: Diagnosis not present

## 2022-11-01 DIAGNOSIS — M6281 Muscle weakness (generalized): Secondary | ICD-10-CM | POA: Diagnosis not present

## 2022-11-01 DIAGNOSIS — S7410XA Injury of femoral nerve at hip and thigh level, unspecified leg, initial encounter: Secondary | ICD-10-CM | POA: Diagnosis not present

## 2022-11-09 DIAGNOSIS — M6281 Muscle weakness (generalized): Secondary | ICD-10-CM | POA: Diagnosis not present

## 2022-11-09 DIAGNOSIS — S7410XA Injury of femoral nerve at hip and thigh level, unspecified leg, initial encounter: Secondary | ICD-10-CM | POA: Diagnosis not present

## 2022-11-17 DIAGNOSIS — M6281 Muscle weakness (generalized): Secondary | ICD-10-CM | POA: Diagnosis not present

## 2022-11-17 DIAGNOSIS — S7410XA Injury of femoral nerve at hip and thigh level, unspecified leg, initial encounter: Secondary | ICD-10-CM | POA: Diagnosis not present

## 2022-11-21 DIAGNOSIS — M6281 Muscle weakness (generalized): Secondary | ICD-10-CM | POA: Diagnosis not present

## 2022-11-21 DIAGNOSIS — S7410XA Injury of femoral nerve at hip and thigh level, unspecified leg, initial encounter: Secondary | ICD-10-CM | POA: Diagnosis not present

## 2022-11-29 DIAGNOSIS — M6281 Muscle weakness (generalized): Secondary | ICD-10-CM | POA: Diagnosis not present

## 2022-11-29 DIAGNOSIS — S7410XA Injury of femoral nerve at hip and thigh level, unspecified leg, initial encounter: Secondary | ICD-10-CM | POA: Diagnosis not present

## 2022-12-05 DIAGNOSIS — M6281 Muscle weakness (generalized): Secondary | ICD-10-CM | POA: Diagnosis not present

## 2022-12-05 DIAGNOSIS — S7410XA Injury of femoral nerve at hip and thigh level, unspecified leg, initial encounter: Secondary | ICD-10-CM | POA: Diagnosis not present

## 2022-12-11 ENCOUNTER — Encounter: Payer: Self-pay | Admitting: Internal Medicine

## 2022-12-13 DIAGNOSIS — E1122 Type 2 diabetes mellitus with diabetic chronic kidney disease: Secondary | ICD-10-CM | POA: Diagnosis not present

## 2022-12-13 DIAGNOSIS — N1831 Chronic kidney disease, stage 3a: Secondary | ICD-10-CM | POA: Diagnosis not present

## 2022-12-13 DIAGNOSIS — I129 Hypertensive chronic kidney disease with stage 1 through stage 4 chronic kidney disease, or unspecified chronic kidney disease: Secondary | ICD-10-CM | POA: Diagnosis not present

## 2022-12-13 DIAGNOSIS — S7410XA Injury of femoral nerve at hip and thigh level, unspecified leg, initial encounter: Secondary | ICD-10-CM | POA: Diagnosis not present

## 2022-12-13 DIAGNOSIS — M6281 Muscle weakness (generalized): Secondary | ICD-10-CM | POA: Diagnosis not present

## 2022-12-13 DIAGNOSIS — R809 Proteinuria, unspecified: Secondary | ICD-10-CM | POA: Diagnosis not present

## 2022-12-14 LAB — LAB REPORT - SCANNED: EGFR: 41

## 2022-12-20 DIAGNOSIS — S7410XA Injury of femoral nerve at hip and thigh level, unspecified leg, initial encounter: Secondary | ICD-10-CM | POA: Diagnosis not present

## 2022-12-20 DIAGNOSIS — M6281 Muscle weakness (generalized): Secondary | ICD-10-CM | POA: Diagnosis not present

## 2022-12-29 DIAGNOSIS — M6281 Muscle weakness (generalized): Secondary | ICD-10-CM | POA: Diagnosis not present

## 2022-12-29 DIAGNOSIS — S7410XA Injury of femoral nerve at hip and thigh level, unspecified leg, initial encounter: Secondary | ICD-10-CM | POA: Diagnosis not present

## 2023-01-04 DIAGNOSIS — S7410XA Injury of femoral nerve at hip and thigh level, unspecified leg, initial encounter: Secondary | ICD-10-CM | POA: Diagnosis not present

## 2023-01-04 DIAGNOSIS — M6281 Muscle weakness (generalized): Secondary | ICD-10-CM | POA: Diagnosis not present

## 2023-01-08 ENCOUNTER — Encounter: Payer: Self-pay | Admitting: Nephrology

## 2023-01-11 ENCOUNTER — Encounter: Payer: Self-pay | Admitting: Internal Medicine

## 2023-01-11 ENCOUNTER — Ambulatory Visit: Payer: BC Managed Care – PPO | Admitting: Internal Medicine

## 2023-01-11 VITALS — BP 150/118 | HR 78 | Temp 98.3°F | Ht 73.0 in

## 2023-01-11 DIAGNOSIS — N183 Chronic kidney disease, stage 3 unspecified: Secondary | ICD-10-CM | POA: Diagnosis not present

## 2023-01-11 DIAGNOSIS — E1169 Type 2 diabetes mellitus with other specified complication: Secondary | ICD-10-CM

## 2023-01-11 DIAGNOSIS — E119 Type 2 diabetes mellitus without complications: Secondary | ICD-10-CM | POA: Diagnosis not present

## 2023-01-11 DIAGNOSIS — S7411XS Injury of femoral nerve at hip and thigh level, right leg, sequela: Secondary | ICD-10-CM

## 2023-01-11 DIAGNOSIS — S7410XA Injury of femoral nerve at hip and thigh level, unspecified leg, initial encounter: Secondary | ICD-10-CM | POA: Diagnosis not present

## 2023-01-11 DIAGNOSIS — I10 Essential (primary) hypertension: Secondary | ICD-10-CM

## 2023-01-11 DIAGNOSIS — Z789 Other specified health status: Secondary | ICD-10-CM

## 2023-01-11 DIAGNOSIS — M6281 Muscle weakness (generalized): Secondary | ICD-10-CM | POA: Diagnosis not present

## 2023-01-11 DIAGNOSIS — R29898 Other symptoms and signs involving the musculoskeletal system: Secondary | ICD-10-CM

## 2023-01-11 DIAGNOSIS — E669 Obesity, unspecified: Secondary | ICD-10-CM

## 2023-01-11 MED ORDER — AZITHROMYCIN 250 MG PO TABS: ORAL_TABLET | ORAL | 0 refills | Status: AC

## 2023-01-11 MED ORDER — CEPHALEXIN 500 MG PO CAPS: 500.0000 mg | ORAL_CAPSULE | Freq: Four times a day (QID) | ORAL | 3 refills | Status: DC

## 2023-01-11 MED ORDER — ONDANSETRON HCL 4 MG PO TABS: 4.0000 mg | ORAL_TABLET | Freq: Three times a day (TID) | ORAL | 0 refills | Status: DC | PRN
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
## 2023-01-11 NOTE — Assessment & Plan Note (Signed)
Diabetic nephropathy  GFR 37 F/u Dr Upton Continue with good hydration and current losartan and Synjardy.  Avoid NSAIDs.  Follow-up with nephrology. We may need to reduce his Metformin and losartan dose 

## 2023-01-11 NOTE — Assessment & Plan Note (Signed)
Trip to [place].  Zofran, Z-Pak: Keflex prescription was given.  He will take Imodium A-D with him.

## 2023-01-11 NOTE — Assessment & Plan Note (Addendum)
On diet and exercise program.  Obtain hemoglobin A1c Check other labs

## 2023-01-11 NOTE — Assessment & Plan Note (Signed)
On electric stimulation/PT Use TENS/ENS at home 

## 2023-01-11 NOTE — Assessment & Plan Note (Signed)
On electric stimulation/PT Use TENS/ENS at home

## 2023-01-26 DIAGNOSIS — M6281 Muscle weakness (generalized): Secondary | ICD-10-CM | POA: Diagnosis not present

## 2023-01-26 DIAGNOSIS — S7410XA Injury of femoral nerve at hip and thigh level, unspecified leg, initial encounter: Secondary | ICD-10-CM | POA: Diagnosis not present

## 2023-02-02 DIAGNOSIS — S7410XA Injury of femoral nerve at hip and thigh level, unspecified leg, initial encounter: Secondary | ICD-10-CM | POA: Diagnosis not present

## 2023-02-02 DIAGNOSIS — M6281 Muscle weakness (generalized): Secondary | ICD-10-CM | POA: Diagnosis not present

## 2023-02-09 DIAGNOSIS — M6281 Muscle weakness (generalized): Secondary | ICD-10-CM | POA: Diagnosis not present

## 2023-02-09 DIAGNOSIS — S7410XA Injury of femoral nerve at hip and thigh level, unspecified leg, initial encounter: Secondary | ICD-10-CM | POA: Diagnosis not present

## 2023-02-16 DIAGNOSIS — S7410XA Injury of femoral nerve at hip and thigh level, unspecified leg, initial encounter: Secondary | ICD-10-CM | POA: Diagnosis not present

## 2023-02-16 DIAGNOSIS — M6281 Muscle weakness (generalized): Secondary | ICD-10-CM | POA: Diagnosis not present

## 2023-02-23 DIAGNOSIS — S7410XA Injury of femoral nerve at hip and thigh level, unspecified leg, initial encounter: Secondary | ICD-10-CM | POA: Diagnosis not present

## 2023-02-23 DIAGNOSIS — M6281 Muscle weakness (generalized): Secondary | ICD-10-CM | POA: Diagnosis not present

## 2023-03-02 DIAGNOSIS — M6281 Muscle weakness (generalized): Secondary | ICD-10-CM | POA: Diagnosis not present

## 2023-03-02 DIAGNOSIS — S7410XA Injury of femoral nerve at hip and thigh level, unspecified leg, initial encounter: Secondary | ICD-10-CM | POA: Diagnosis not present

## 2023-03-05 ENCOUNTER — Telehealth: Payer: Self-pay | Admitting: Internal Medicine

## 2023-03-05 NOTE — Telephone Encounter (Signed)
Patient called and said he signed a medical records release form for a specialist he is going to see in [place]. He said they received everything except imaging. He has left messages for the medical records department, but he has not heard back and he is concerned that nobody has heard the messages. He would like to know what to do or if we can have the needed media sent. The specialist is [facility] and [address] in [place], [place] [address]. Patient would like a call back at [phone].

## 2023-03-06 ENCOUNTER — Telehealth: Payer: Self-pay | Admitting: Internal Medicine

## 2023-03-06 NOTE — Telephone Encounter (Signed)
This patient had x-rays at this office on 01/18/21, 07/02/20, 03/30/20, and 01/27/20. He is needing a disk with these images to take to a surgeon. Is this something you can put onto a disk for him, or do we need to get it from somewhere else?  Thank you!
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
## 2023-03-06 NOTE — Telephone Encounter (Signed)
Called pt w/ Carollee Herter response.Marland KitchenRaechel Chute

## 2023-03-09 DIAGNOSIS — M6281 Muscle weakness (generalized): Secondary | ICD-10-CM | POA: Diagnosis not present

## 2023-03-09 DIAGNOSIS — S7410XA Injury of femoral nerve at hip and thigh level, unspecified leg, initial encounter: Secondary | ICD-10-CM | POA: Diagnosis not present

## 2023-03-12 DIAGNOSIS — M5136 Other intervertebral disc degeneration, lumbar region: Secondary | ICD-10-CM | POA: Diagnosis not present

## 2023-03-12 DIAGNOSIS — M79671 Pain in right foot: Secondary | ICD-10-CM | POA: Diagnosis not present

## 2023-03-12 DIAGNOSIS — M79651 Pain in right thigh: Secondary | ICD-10-CM | POA: Diagnosis not present

## 2023-03-12 DIAGNOSIS — M5442 Lumbago with sciatica, left side: Secondary | ICD-10-CM | POA: Diagnosis not present

## 2023-03-12 DIAGNOSIS — M79672 Pain in left foot: Secondary | ICD-10-CM | POA: Diagnosis not present

## 2023-03-12 DIAGNOSIS — R269 Unspecified abnormalities of gait and mobility: Secondary | ICD-10-CM | POA: Diagnosis not present

## 2023-03-12 DIAGNOSIS — Q72811 Congenital shortening of right lower limb: Secondary | ICD-10-CM | POA: Diagnosis not present

## 2023-03-12 DIAGNOSIS — M5412 Radiculopathy, cervical region: Secondary | ICD-10-CM | POA: Diagnosis not present

## 2023-03-12 DIAGNOSIS — M5414 Radiculopathy, thoracic region: Secondary | ICD-10-CM | POA: Diagnosis not present

## 2023-03-12 DIAGNOSIS — M5116 Intervertebral disc disorders with radiculopathy, lumbar region: Secondary | ICD-10-CM | POA: Diagnosis not present

## 2023-03-13 DIAGNOSIS — M25561 Pain in right knee: Secondary | ICD-10-CM | POA: Diagnosis not present

## 2023-03-13 DIAGNOSIS — M79604 Pain in right leg: Secondary | ICD-10-CM | POA: Diagnosis not present

## 2023-03-13 DIAGNOSIS — M79651 Pain in right thigh: Secondary | ICD-10-CM | POA: Diagnosis not present

## 2023-03-13 DIAGNOSIS — M5136 Other intervertebral disc degeneration, lumbar region: Secondary | ICD-10-CM | POA: Diagnosis not present

## 2023-03-13 DIAGNOSIS — M25562 Pain in left knee: Secondary | ICD-10-CM | POA: Diagnosis not present

## 2023-03-13 DIAGNOSIS — M5116 Intervertebral disc disorders with radiculopathy, lumbar region: Secondary | ICD-10-CM | POA: Diagnosis not present

## 2023-03-13 DIAGNOSIS — R269 Unspecified abnormalities of gait and mobility: Secondary | ICD-10-CM | POA: Diagnosis not present

## 2023-03-13 DIAGNOSIS — M79672 Pain in left foot: Secondary | ICD-10-CM | POA: Diagnosis not present

## 2023-03-13 DIAGNOSIS — M5412 Radiculopathy, cervical region: Secondary | ICD-10-CM | POA: Diagnosis not present

## 2023-03-13 DIAGNOSIS — M5442 Lumbago with sciatica, left side: Secondary | ICD-10-CM | POA: Diagnosis not present

## 2023-03-13 DIAGNOSIS — M5414 Radiculopathy, thoracic region: Secondary | ICD-10-CM | POA: Diagnosis not present

## 2023-03-13 DIAGNOSIS — M79671 Pain in right foot: Secondary | ICD-10-CM | POA: Diagnosis not present

## 2023-03-14 DIAGNOSIS — M79671 Pain in right foot: Secondary | ICD-10-CM | POA: Diagnosis not present

## 2023-03-14 DIAGNOSIS — M79605 Pain in left leg: Secondary | ICD-10-CM | POA: Diagnosis not present

## 2023-03-14 DIAGNOSIS — M79651 Pain in right thigh: Secondary | ICD-10-CM | POA: Diagnosis not present

## 2023-03-14 DIAGNOSIS — M25561 Pain in right knee: Secondary | ICD-10-CM | POA: Diagnosis not present

## 2023-03-14 DIAGNOSIS — M79672 Pain in left foot: Secondary | ICD-10-CM | POA: Diagnosis not present

## 2023-03-14 DIAGNOSIS — R269 Unspecified abnormalities of gait and mobility: Secondary | ICD-10-CM | POA: Diagnosis not present

## 2023-03-14 DIAGNOSIS — M5412 Radiculopathy, cervical region: Secondary | ICD-10-CM | POA: Diagnosis not present

## 2023-03-14 DIAGNOSIS — M5414 Radiculopathy, thoracic region: Secondary | ICD-10-CM | POA: Diagnosis not present

## 2023-03-15 DIAGNOSIS — R269 Unspecified abnormalities of gait and mobility: Secondary | ICD-10-CM | POA: Diagnosis not present

## 2023-03-15 DIAGNOSIS — M25521 Pain in right elbow: Secondary | ICD-10-CM | POA: Diagnosis not present

## 2023-03-15 DIAGNOSIS — M5412 Radiculopathy, cervical region: Secondary | ICD-10-CM | POA: Diagnosis not present

## 2023-03-15 DIAGNOSIS — M79651 Pain in right thigh: Secondary | ICD-10-CM | POA: Diagnosis not present

## 2023-03-15 DIAGNOSIS — M5136 Other intervertebral disc degeneration, lumbar region: Secondary | ICD-10-CM | POA: Diagnosis not present

## 2023-03-15 DIAGNOSIS — M25531 Pain in right wrist: Secondary | ICD-10-CM | POA: Diagnosis not present

## 2023-03-15 DIAGNOSIS — R102 Pelvic and perineal pain: Secondary | ICD-10-CM | POA: Diagnosis not present

## 2023-03-15 DIAGNOSIS — M79672 Pain in left foot: Secondary | ICD-10-CM | POA: Diagnosis not present

## 2023-03-15 DIAGNOSIS — M79605 Pain in left leg: Secondary | ICD-10-CM | POA: Diagnosis not present

## 2023-03-16 DIAGNOSIS — M79605 Pain in left leg: Secondary | ICD-10-CM | POA: Diagnosis not present

## 2023-03-16 DIAGNOSIS — M5412 Radiculopathy, cervical region: Secondary | ICD-10-CM | POA: Diagnosis not present

## 2023-03-16 DIAGNOSIS — M79672 Pain in left foot: Secondary | ICD-10-CM | POA: Diagnosis not present

## 2023-03-16 DIAGNOSIS — M79671 Pain in right foot: Secondary | ICD-10-CM | POA: Diagnosis not present

## 2023-03-16 DIAGNOSIS — M79651 Pain in right thigh: Secondary | ICD-10-CM | POA: Diagnosis not present

## 2023-03-16 DIAGNOSIS — R269 Unspecified abnormalities of gait and mobility: Secondary | ICD-10-CM | POA: Diagnosis not present

## 2023-03-17 DIAGNOSIS — M79671 Pain in right foot: Secondary | ICD-10-CM | POA: Diagnosis not present

## 2023-03-17 DIAGNOSIS — M5116 Intervertebral disc disorders with radiculopathy, lumbar region: Secondary | ICD-10-CM | POA: Diagnosis not present

## 2023-03-17 DIAGNOSIS — M5412 Radiculopathy, cervical region: Secondary | ICD-10-CM | POA: Diagnosis not present

## 2023-03-17 DIAGNOSIS — M5414 Radiculopathy, thoracic region: Secondary | ICD-10-CM | POA: Diagnosis not present

## 2023-03-23 DIAGNOSIS — M6281 Muscle weakness (generalized): Secondary | ICD-10-CM | POA: Diagnosis not present

## 2023-03-23 DIAGNOSIS — S7410XA Injury of femoral nerve at hip and thigh level, unspecified leg, initial encounter: Secondary | ICD-10-CM | POA: Diagnosis not present

## 2023-04-12 ENCOUNTER — Encounter (INDEPENDENT_AMBULATORY_CARE_PROVIDER_SITE_OTHER): Payer: Self-pay

## 2023-04-12 DIAGNOSIS — S7410XA Injury of femoral nerve at hip and thigh level, unspecified leg, initial encounter: Secondary | ICD-10-CM | POA: Diagnosis not present

## 2023-04-12 DIAGNOSIS — M6281 Muscle weakness (generalized): Secondary | ICD-10-CM | POA: Diagnosis not present

## 2023-04-19 DIAGNOSIS — M6281 Muscle weakness (generalized): Secondary | ICD-10-CM | POA: Diagnosis not present

## 2023-04-19 DIAGNOSIS — S7410XA Injury of femoral nerve at hip and thigh level, unspecified leg, initial encounter: Secondary | ICD-10-CM | POA: Diagnosis not present

## 2023-04-25 DIAGNOSIS — S7410XA Injury of femoral nerve at hip and thigh level, unspecified leg, initial encounter: Secondary | ICD-10-CM | POA: Diagnosis not present

## 2023-04-25 DIAGNOSIS — M6281 Muscle weakness (generalized): Secondary | ICD-10-CM | POA: Diagnosis not present

## 2023-05-04 ENCOUNTER — Encounter: Payer: Self-pay | Admitting: Pharmacist

## 2023-05-04 DIAGNOSIS — M6281 Muscle weakness (generalized): Secondary | ICD-10-CM | POA: Diagnosis not present

## 2023-05-04 DIAGNOSIS — S7410XA Injury of femoral nerve at hip and thigh level, unspecified leg, initial encounter: Secondary | ICD-10-CM | POA: Diagnosis not present

## 2023-05-10 ENCOUNTER — Ambulatory Visit: Payer: BC Managed Care – PPO | Admitting: Internal Medicine

## 2023-05-10 ENCOUNTER — Encounter: Payer: Self-pay | Admitting: Internal Medicine

## 2023-05-10 VITALS — BP 130/82 | HR 88 | Temp 98.3°F | Ht 73.0 in | Wt 271.6 lb

## 2023-05-10 DIAGNOSIS — Z91018 Allergy to other foods: Secondary | ICD-10-CM

## 2023-05-10 DIAGNOSIS — G629 Polyneuropathy, unspecified: Secondary | ICD-10-CM | POA: Diagnosis not present

## 2023-05-10 DIAGNOSIS — E119 Type 2 diabetes mellitus without complications: Secondary | ICD-10-CM | POA: Diagnosis not present

## 2023-05-10 DIAGNOSIS — R29898 Other symptoms and signs involving the musculoskeletal system: Secondary | ICD-10-CM

## 2023-05-10 DIAGNOSIS — I10 Essential (primary) hypertension: Secondary | ICD-10-CM | POA: Diagnosis not present

## 2023-05-10 DIAGNOSIS — K5904 Chronic idiopathic constipation: Secondary | ICD-10-CM

## 2023-05-10 LAB — CBC WITH DIFFERENTIAL/PLATELET
Basophils Absolute: 0 10*3/uL (ref 0.0–0.1)
Basophils Relative: 0.4 % (ref 0.0–3.0)
Eosinophils Absolute: 0 10*3/uL (ref 0.0–0.7)
Eosinophils Relative: 0.7 % (ref 0.0–5.0)
HCT: 46.2 % (ref 39.0–52.0)
Hemoglobin: 15.2 g/dL (ref 13.0–17.0)
Lymphocytes Relative: 49.6 % — ABNORMAL HIGH (ref 12.0–46.0)
Lymphs Abs: 2.5 10*3/uL (ref 0.7–4.0)
MCHC: 32.9 g/dL (ref 30.0–36.0)
MCV: 93.7 fl (ref 78.0–100.0)
Monocytes Absolute: 0.3 10*3/uL (ref 0.1–1.0)
Monocytes Relative: 6.6 % (ref 3.0–12.0)
Neutro Abs: 2.1 10*3/uL (ref 1.4–7.7)
Neutrophils Relative %: 42.7 % — ABNORMAL LOW (ref 43.0–77.0)
Platelets: 215 10*3/uL (ref 150.0–400.0)
RBC: 4.93 Mil/uL (ref 4.22–5.81)
RDW: 13.7 % (ref 11.5–15.5)
WBC: 5 10*3/uL (ref 4.0–10.5)

## 2023-05-10 LAB — URINALYSIS, ROUTINE W REFLEX MICROSCOPIC
Bilirubin Urine: NEGATIVE
Ketones, ur: NEGATIVE
Leukocytes,Ua: NEGATIVE
Nitrite: NEGATIVE
Specific Gravity, Urine: 1.02 (ref 1.000–1.030)
Total Protein, Urine: 30 — AB
Urine Glucose: NEGATIVE
Urobilinogen, UA: 0.2 (ref 0.0–1.0)
pH: 7 (ref 5.0–8.0)

## 2023-05-10 LAB — LIPID PANEL
Cholesterol: 197 mg/dL (ref 0–200)
HDL: 69.2 mg/dL (ref >39.00)
LDL Cholesterol: 114 mg/dL — ABNORMAL HIGH (ref 0–99)
NonHDL: 127.34
Total CHOL/HDL Ratio: 3
Triglycerides: 68 mg/dL (ref 0.0–149.0)
VLDL: 13.6 mg/dL (ref 0.0–40.0)

## 2023-05-10 LAB — COMPREHENSIVE METABOLIC PANEL
ALT: 14 U/L (ref 0–53)
AST: 21 U/L (ref 0–37)
Albumin: 4.6 g/dL (ref 3.5–5.2)
Alkaline Phosphatase: 50 U/L (ref 39–117)
BUN: 23 mg/dL (ref 6–23)
CO2: 29 meq/L (ref 19–32)
Calcium: 10.6 mg/dL — ABNORMAL HIGH (ref 8.4–10.5)
Chloride: 101 meq/L (ref 96–112)
Creatinine, Ser: 1.6 mg/dL — ABNORMAL HIGH (ref 0.40–1.50)
GFR: 48.17 mL/min — ABNORMAL LOW (ref >60.00)
Glucose, Bld: 87 mg/dL (ref 70–99)
Potassium: 4.9 meq/L (ref 3.5–5.1)
Sodium: 140 meq/L (ref 135–145)
Total Bilirubin: 1.3 mg/dL — ABNORMAL HIGH (ref 0.2–1.2)
Total Protein: 8.1 g/dL (ref 6.0–8.3)

## 2023-05-10 LAB — MICROALBUMIN / CREATININE URINE RATIO
Creatinine,U: 164.6 mg/dL
Microalb Creat Ratio: 11.1 mg/g (ref 0.0–30.0)
Microalb, Ur: 18.2 mg/dL — ABNORMAL HIGH (ref 0.0–1.9)

## 2023-05-10 LAB — TSH: TSH: 0.64 u[IU]/mL (ref 0.35–5.50)

## 2023-05-10 LAB — PSA: PSA: 1.11 ng/mL (ref 0.10–4.00)

## 2023-05-10 LAB — HEMOGLOBIN A1C: Hgb A1c MFr Bld: 6.5 % (ref 4.6–6.5)

## 2023-05-10 NOTE — Assessment & Plan Note (Addendum)
Better  

## 2023-05-10 NOTE — Assessment & Plan Note (Signed)
Seeing Dr Sander Nephew in [place] - MF release Teresita Madura in [place]) - doing much better

## 2023-05-10 NOTE — Assessment & Plan Note (Signed)
On diet and exercise program.  Obtain hemoglobin A1c Check other labs

## 2023-05-10 NOTE — Assessment & Plan Note (Signed)
  BP Readings from Last 3 Encounters:  05/10/23 (!) 150/96  01/11/23 (!) 150/118  09/07/22 120/72

## 2023-05-10 NOTE — Assessment & Plan Note (Addendum)
Off meds. On diet Check A1c
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
## 2023-05-10 NOTE — Assessment & Plan Note (Signed)
Rash has resolved.  No relapse

## 2023-05-11 DIAGNOSIS — S7410XA Injury of femoral nerve at hip and thigh level, unspecified leg, initial encounter: Secondary | ICD-10-CM | POA: Diagnosis not present

## 2023-05-11 DIAGNOSIS — M6281 Muscle weakness (generalized): Secondary | ICD-10-CM | POA: Diagnosis not present

## 2023-05-14 ENCOUNTER — Other Ambulatory Visit: Payer: Self-pay | Admitting: Internal Medicine

## 2023-05-14 DIAGNOSIS — R809 Proteinuria, unspecified: Secondary | ICD-10-CM | POA: Insufficient documentation

## 2023-05-14 DIAGNOSIS — E119 Type 2 diabetes mellitus without complications: Secondary | ICD-10-CM

## 2023-05-14 NOTE — Assessment & Plan Note (Signed)
On losartan for microalbuminuria prevention.

## 2023-05-14 NOTE — Assessment & Plan Note (Signed)
Obtain PTH, vitamin D.

## 2023-05-14 NOTE — Assessment & Plan Note (Signed)
Continue to monitor A1c.  On losartan for microalbuminuria prevention.

## 2023-06-07 ENCOUNTER — Other Ambulatory Visit: Payer: Self-pay | Admitting: Internal Medicine

## 2023-06-12 DIAGNOSIS — R809 Proteinuria, unspecified: Secondary | ICD-10-CM | POA: Diagnosis not present

## 2023-06-12 DIAGNOSIS — N1831 Chronic kidney disease, stage 3a: Secondary | ICD-10-CM | POA: Diagnosis not present

## 2023-06-12 DIAGNOSIS — E1122 Type 2 diabetes mellitus with diabetic chronic kidney disease: Secondary | ICD-10-CM | POA: Diagnosis not present

## 2023-06-12 DIAGNOSIS — I129 Hypertensive chronic kidney disease with stage 1 through stage 4 chronic kidney disease, or unspecified chronic kidney disease: Secondary | ICD-10-CM | POA: Diagnosis not present

## 2023-06-21 DIAGNOSIS — S7410XA Injury of femoral nerve at hip and thigh level, unspecified leg, initial encounter: Secondary | ICD-10-CM | POA: Diagnosis not present

## 2023-06-21 DIAGNOSIS — M6281 Muscle weakness (generalized): Secondary | ICD-10-CM | POA: Diagnosis not present

## 2023-08-14 ENCOUNTER — Telehealth: Payer: Self-pay | Admitting: Internal Medicine

## 2023-08-14 DIAGNOSIS — M5104 Intervertebral disc disorders with myelopathy, thoracic region: Secondary | ICD-10-CM

## 2023-08-14 DIAGNOSIS — G629 Polyneuropathy, unspecified: Secondary | ICD-10-CM

## 2023-08-14 DIAGNOSIS — S7411XS Injury of femoral nerve at hip and thigh level, right leg, sequela: Secondary | ICD-10-CM

## 2023-08-14 NOTE — Telephone Encounter (Signed)
Patient needs a new referral to physical therapy - [facility] - they need referral and latest office notes faxed to them at:  [phone]

## 2023-08-15 NOTE — Telephone Encounter (Signed)
Okay.  It is done.  Thanks

## 2023-09-07 DIAGNOSIS — S7411XS Injury of femoral nerve at hip and thigh level, right leg, sequela: Secondary | ICD-10-CM | POA: Diagnosis not present

## 2023-09-07 DIAGNOSIS — G629 Polyneuropathy, unspecified: Secondary | ICD-10-CM | POA: Diagnosis not present

## 2023-09-07 DIAGNOSIS — R2689 Other abnormalities of gait and mobility: Secondary | ICD-10-CM | POA: Diagnosis not present

## 2023-09-11 ENCOUNTER — Ambulatory Visit: Payer: BC Managed Care – PPO | Admitting: Internal Medicine

## 2023-09-11 ENCOUNTER — Encounter: Payer: Self-pay | Admitting: Internal Medicine

## 2023-09-11 VITALS — BP 122/80 | HR 74 | Temp 98.3°F | Ht 73.0 in | Wt 270.0 lb

## 2023-09-11 DIAGNOSIS — R209 Unspecified disturbances of skin sensation: Secondary | ICD-10-CM

## 2023-09-11 DIAGNOSIS — E119 Type 2 diabetes mellitus without complications: Secondary | ICD-10-CM | POA: Diagnosis not present

## 2023-09-11 DIAGNOSIS — N183 Chronic kidney disease, stage 3 unspecified: Secondary | ICD-10-CM

## 2023-09-11 DIAGNOSIS — I10 Essential (primary) hypertension: Secondary | ICD-10-CM | POA: Diagnosis not present

## 2023-09-11 LAB — CBC WITH DIFFERENTIAL/PLATELET
Basophils Absolute: 0 10*3/uL (ref 0.0–0.1)
Basophils Relative: 0.3 % (ref 0.0–3.0)
Eosinophils Absolute: 0 10*3/uL (ref 0.0–0.7)
Eosinophils Relative: 0.6 % (ref 0.0–5.0)
HCT: 46.6 % (ref 39.0–52.0)
Hemoglobin: 15.3 g/dL (ref 13.0–17.0)
Lymphocytes Relative: 57.4 % — ABNORMAL HIGH (ref 12.0–46.0)
Lymphs Abs: 2.9 10*3/uL (ref 0.7–4.0)
MCHC: 32.9 g/dL (ref 30.0–36.0)
MCV: 94.4 fL (ref 78.0–100.0)
Monocytes Absolute: 0.4 10*3/uL (ref 0.1–1.0)
Monocytes Relative: 6.9 % (ref 3.0–12.0)
Neutro Abs: 1.8 10*3/uL (ref 1.4–7.7)
Neutrophils Relative %: 34.8 % — ABNORMAL LOW (ref 43.0–77.0)
Platelets: 188 10*3/uL (ref 150.0–400.0)
RBC: 4.94 Mil/uL (ref 4.22–5.81)
RDW: 13.1 % (ref 11.5–15.5)
WBC: 5.1 10*3/uL (ref 4.0–10.5)

## 2023-09-11 LAB — COMPREHENSIVE METABOLIC PANEL
ALT: 16 U/L (ref 0–53)
AST: 21 U/L (ref 0–37)
Albumin: 4.8 g/dL (ref 3.5–5.2)
Alkaline Phosphatase: 62 U/L (ref 39–117)
BUN: 21 mg/dL (ref 6–23)
CO2: 31 meq/L (ref 19–32)
Calcium: 10.4 mg/dL (ref 8.4–10.5)
Chloride: 99 meq/L (ref 96–112)
Creatinine, Ser: 1.63 mg/dL — ABNORMAL HIGH (ref 0.40–1.50)
GFR: 46.99 mL/min — ABNORMAL LOW (ref >60.00)
Glucose, Bld: 116 mg/dL — ABNORMAL HIGH (ref 70–99)
Potassium: 4.8 meq/L (ref 3.5–5.1)
Sodium: 138 meq/L (ref 135–145)
Total Bilirubin: 1.1 mg/dL (ref 0.2–1.2)
Total Protein: 8.3 g/dL (ref 6.0–8.3)

## 2023-09-11 LAB — HEMOGLOBIN A1C: Hgb A1c MFr Bld: 7 % — ABNORMAL HIGH (ref 4.6–6.5)

## 2023-09-11 LAB — VITAMIN B12: Vitamin B-12: 585 pg/mL (ref 211–911)

## 2023-09-11 MED ORDER — B COMPLEX VITAMINS PO CAPS: 1.0000 | ORAL_CAPSULE | Freq: Every day | ORAL | Status: AC

## 2023-09-11 MED ORDER — LOSARTAN POTASSIUM 100 MG PO TABS: 100.0000 mg | ORAL_TABLET | Freq: Every day | ORAL | 3 refills | Status: DC

## 2023-09-11 MED ORDER — CEPHALEXIN 500 MG PO CAPS: 500.0000 mg | ORAL_CAPSULE | Freq: Four times a day (QID) | ORAL | 3 refills | Status: DC

## 2023-09-11 NOTE — Assessment & Plan Note (Signed)
Continue to monitor A1c.  On losartan for microalbuminuria prevention.
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
## 2023-09-11 NOTE — Assessment & Plan Note (Signed)
Diabetic nephropathy  GFR 37 F/u Dr Upton Continue with good hydration and current losartan and Synjardy.  Avoid NSAIDs.  Follow-up with nephrology. We may need to reduce his Metformin and losartan dose 

## 2023-09-11 NOTE — Assessment & Plan Note (Signed)
  BP Readings from Last 3 Encounters:  09/11/23 122/80  05/10/23 130/82  01/11/23 (!) 150/118

## 2023-09-13 DIAGNOSIS — G629 Polyneuropathy, unspecified: Secondary | ICD-10-CM | POA: Diagnosis not present

## 2023-09-13 DIAGNOSIS — S7411XS Injury of femoral nerve at hip and thigh level, right leg, sequela: Secondary | ICD-10-CM | POA: Diagnosis not present

## 2023-09-13 DIAGNOSIS — R2689 Other abnormalities of gait and mobility: Secondary | ICD-10-CM | POA: Diagnosis not present

## 2023-09-16 ENCOUNTER — Encounter: Payer: Self-pay | Admitting: Internal Medicine

## 2023-10-04 DIAGNOSIS — G629 Polyneuropathy, unspecified: Secondary | ICD-10-CM | POA: Diagnosis not present

## 2023-10-04 DIAGNOSIS — R2689 Other abnormalities of gait and mobility: Secondary | ICD-10-CM | POA: Diagnosis not present

## 2023-10-04 DIAGNOSIS — S7411XS Injury of femoral nerve at hip and thigh level, right leg, sequela: Secondary | ICD-10-CM | POA: Diagnosis not present

## 2023-10-12 DIAGNOSIS — S7411XS Injury of femoral nerve at hip and thigh level, right leg, sequela: Secondary | ICD-10-CM | POA: Diagnosis not present

## 2023-10-12 DIAGNOSIS — R2689 Other abnormalities of gait and mobility: Secondary | ICD-10-CM | POA: Diagnosis not present

## 2023-10-12 DIAGNOSIS — G629 Polyneuropathy, unspecified: Secondary | ICD-10-CM | POA: Diagnosis not present

## 2023-10-26 DIAGNOSIS — G629 Polyneuropathy, unspecified: Secondary | ICD-10-CM | POA: Diagnosis not present

## 2023-10-26 DIAGNOSIS — R2689 Other abnormalities of gait and mobility: Secondary | ICD-10-CM | POA: Diagnosis not present

## 2023-10-26 DIAGNOSIS — S7411XS Injury of femoral nerve at hip and thigh level, right leg, sequela: Secondary | ICD-10-CM | POA: Diagnosis not present

## 2023-11-02 DIAGNOSIS — R2689 Other abnormalities of gait and mobility: Secondary | ICD-10-CM | POA: Diagnosis not present

## 2023-11-02 DIAGNOSIS — S7411XS Injury of femoral nerve at hip and thigh level, right leg, sequela: Secondary | ICD-10-CM | POA: Diagnosis not present

## 2023-11-02 DIAGNOSIS — G629 Polyneuropathy, unspecified: Secondary | ICD-10-CM | POA: Diagnosis not present

## 2023-11-09 ENCOUNTER — Ambulatory Visit: Admitting: Family Medicine

## 2023-11-09 ENCOUNTER — Telehealth: Payer: Self-pay

## 2023-11-09 ENCOUNTER — Encounter: Payer: Self-pay | Admitting: Family Medicine

## 2023-11-09 VITALS — BP 126/78 | HR 100 | Temp 98.7°F | Ht 73.0 in | Wt 273.0 lb

## 2023-11-09 DIAGNOSIS — R2689 Other abnormalities of gait and mobility: Secondary | ICD-10-CM | POA: Diagnosis not present

## 2023-11-09 DIAGNOSIS — R21 Rash and other nonspecific skin eruption: Secondary | ICD-10-CM | POA: Diagnosis not present

## 2023-11-09 DIAGNOSIS — G629 Polyneuropathy, unspecified: Secondary | ICD-10-CM | POA: Diagnosis not present

## 2023-11-09 DIAGNOSIS — S7411XS Injury of femoral nerve at hip and thigh level, right leg, sequela: Secondary | ICD-10-CM | POA: Diagnosis not present

## 2023-11-09 MED ORDER — NYSTATIN-TRIAMCINOLONE 100000-0.1 UNIT/GM-% EX CREA
1.0000 | TOPICAL_CREAM | Freq: Two times a day (BID) | CUTANEOUS | 1 refills | Status: DC
Start: 2023-11-09 — End: 2024-01-11

## 2023-11-09 NOTE — Telephone Encounter (Signed)
-----   Message from Moshe Cipro sent at 11/09/2023 12:56 PM EDT ----- Regarding: rx for rash Would you let him know that I had Dr Okey Dupre look at his rash with me. We agree that it is possibly fungal/inflammatory in nature. I am going to send a cream with an antifungal and steroid to help. Please let him know that rashes that come around and stay this long sometimes takes weeks-months to resolve, even with treatment.

## 2023-11-09 NOTE — Patient Instructions (Signed)
 I have sent in a combination antifungal and steroid cream for you to use on your lower leg.  Rashes that appear like this and stay for this long, tend to take a while to heal even with treatment.  Please let me know if this is not helping, and we will evaluate further.

## 2023-11-09 NOTE — Progress Notes (Signed)
        Acute Office Visit  Subjective:     Patient ID: [name], male    DOB: [birth year], 58 y.o.   MRN: [id number]  Chief Complaint  Patient presents with   Acute Visit    Ongoing for about 6 months, comes and goes with the seasons, has not went away this time. left leg    HPI Patient is in today for evaluation of rash. Reports that it has been present for the last 6 months.  States the rash recurs every year and usually heals in the winter, but did not this year.  Has been using moisturizer to treat the area. Denies any erythema, discharge, itching, known bug bites or new exposures. Denies other concerns today. Medical history as outlined below   ROS Per HPI      Objective:    BP 126/78 (BP Location: Left Arm, Patient Position: Sitting)   Pulse 100   Temp 98.7 F (37.1 C) (Temporal)   Ht 6\' 1"  (1.854 m)   Wt 273 lb (123.8 kg)   SpO2 95%   BMI 36.02 kg/m    Physical Exam Vitals and nursing note reviewed.  Constitutional:      General: He is not in acute distress.    Appearance: Normal appearance.  HENT:     Head: Normocephalic and atraumatic.  Eyes:     Extraocular Movements: Extraocular movements intact.  Cardiovascular:     Rate and Rhythm: Normal rate.     Heart sounds: Normal heart sounds.  Pulmonary:     Effort: Pulmonary effort is normal.  Musculoskeletal:        General: Normal range of motion.     Cervical back: Normal range of motion.  Skin:    General: Skin is warm and dry.     Findings: Rash (See photos) present.  Neurological:     General: No focal deficit present.     Mental Status: He is alert and oriented to person, place, and time.  Psychiatric:        Mood and Affect: Mood normal.        Behavior: Behavior normal.    No results found for any visits on 11/09/23.      Assessment & Plan:   Rash -     Nystatin-Triamcinolone; Apply 1 Application topically 2 (two) times daily.  Dispense: 60 g; Refill: 1     Meds  ordered this encounter  Medications   nystatin-triamcinolone (MYCOLOG II) cream    Sig: Apply 1 Application topically 2 (two) times daily.    Dispense:  60 g    Refill:  1    Return if symptoms worsen or fail to improve.  Moshe Cipro, FNP

## 2023-11-09 NOTE — Telephone Encounter (Signed)
 Spoke with patient, made him aware of medications being sent in

## 2023-11-22 DIAGNOSIS — G629 Polyneuropathy, unspecified: Secondary | ICD-10-CM | POA: Diagnosis not present

## 2023-11-22 DIAGNOSIS — S7411XS Injury of femoral nerve at hip and thigh level, right leg, sequela: Secondary | ICD-10-CM | POA: Diagnosis not present

## 2023-11-22 DIAGNOSIS — R2689 Other abnormalities of gait and mobility: Secondary | ICD-10-CM | POA: Diagnosis not present

## 2023-11-30 DIAGNOSIS — G629 Polyneuropathy, unspecified: Secondary | ICD-10-CM | POA: Diagnosis not present

## 2023-11-30 DIAGNOSIS — S7411XS Injury of femoral nerve at hip and thigh level, right leg, sequela: Secondary | ICD-10-CM | POA: Diagnosis not present

## 2023-11-30 DIAGNOSIS — R2689 Other abnormalities of gait and mobility: Secondary | ICD-10-CM | POA: Diagnosis not present

## 2023-12-06 DIAGNOSIS — S7411XS Injury of femoral nerve at hip and thigh level, right leg, sequela: Secondary | ICD-10-CM | POA: Diagnosis not present

## 2023-12-06 DIAGNOSIS — R2689 Other abnormalities of gait and mobility: Secondary | ICD-10-CM | POA: Diagnosis not present

## 2023-12-06 DIAGNOSIS — G629 Polyneuropathy, unspecified: Secondary | ICD-10-CM | POA: Diagnosis not present

## 2023-12-11 ENCOUNTER — Ambulatory Visit: Payer: BC Managed Care – PPO | Admitting: Internal Medicine

## 2023-12-25 ENCOUNTER — Ambulatory Visit: Admitting: Internal Medicine

## 2023-12-25 DIAGNOSIS — I129 Hypertensive chronic kidney disease with stage 1 through stage 4 chronic kidney disease, or unspecified chronic kidney disease: Secondary | ICD-10-CM | POA: Diagnosis not present

## 2023-12-25 DIAGNOSIS — N1831 Chronic kidney disease, stage 3a: Secondary | ICD-10-CM | POA: Diagnosis not present

## 2023-12-25 DIAGNOSIS — R809 Proteinuria, unspecified: Secondary | ICD-10-CM | POA: Diagnosis not present

## 2023-12-25 DIAGNOSIS — E1122 Type 2 diabetes mellitus with diabetic chronic kidney disease: Secondary | ICD-10-CM | POA: Diagnosis not present

## 2024-01-02 ENCOUNTER — Ambulatory Visit: Admitting: Internal Medicine

## 2024-01-09 ENCOUNTER — Ambulatory Visit: Admitting: Internal Medicine

## 2024-01-11 ENCOUNTER — Ambulatory Visit (INDEPENDENT_AMBULATORY_CARE_PROVIDER_SITE_OTHER): Admitting: Internal Medicine

## 2024-01-11 ENCOUNTER — Encounter: Payer: Self-pay | Admitting: Internal Medicine

## 2024-01-11 VITALS — BP 134/94 | HR 80 | Temp 99.2°F | Ht 73.0 in | Wt 275.4 lb

## 2024-01-11 DIAGNOSIS — E119 Type 2 diabetes mellitus without complications: Secondary | ICD-10-CM | POA: Diagnosis not present

## 2024-01-11 DIAGNOSIS — R21 Rash and other nonspecific skin eruption: Secondary | ICD-10-CM

## 2024-01-11 DIAGNOSIS — L309 Dermatitis, unspecified: Secondary | ICD-10-CM

## 2024-01-11 DIAGNOSIS — I10 Essential (primary) hypertension: Secondary | ICD-10-CM

## 2024-01-11 DIAGNOSIS — N183 Chronic kidney disease, stage 3 unspecified: Secondary | ICD-10-CM | POA: Diagnosis not present

## 2024-01-11 MED ORDER — NYSTATIN-TRIAMCINOLONE 100000-0.1 UNIT/GM-% EX CREA: 1.0000 | TOPICAL_CREAM | Freq: Two times a day (BID) | CUTANEOUS | 1 refills | Status: AC

## 2024-01-11 MED ORDER — CEPHALEXIN 500 MG PO CAPS: 500.0000 mg | ORAL_CAPSULE | Freq: Four times a day (QID) | ORAL | 3 refills | Status: AC

## 2024-01-11 MED ORDER — METHYLPREDNISOLONE 4 MG PO TBPK: ORAL_TABLET | ORAL | 0 refills | Status: AC

## 2024-01-11 NOTE — Assessment & Plan Note (Addendum)
Continue to monitor A1c.  On losartan for microalbuminuria prevention.
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
## 2024-01-11 NOTE — Assessment & Plan Note (Signed)
 Better Medrol  pack

## 2024-01-11 NOTE — Assessment & Plan Note (Signed)
Diabetic nephropathy  GFR 37 F/u Dr Upton Continue with good hydration and current losartan and Synjardy.  Avoid NSAIDs.  Follow-up with nephrology. We may need to reduce his Metformin and losartan dose 

## 2024-01-11 NOTE — Assessment & Plan Note (Signed)
Medrol pack 

## 2024-01-11 NOTE — Assessment & Plan Note (Signed)
  BP Readings from Last 3 Encounters:  01/11/24 (!) 134/94  11/09/23 126/78  09/11/23 122/80

## 2024-02-20 ENCOUNTER — Encounter: Payer: Self-pay | Admitting: Internal Medicine

## 2024-02-20 ENCOUNTER — Telehealth: Payer: Self-pay | Admitting: Family Medicine

## 2024-02-20 NOTE — Telephone Encounter (Signed)
 Patient called stating that when he was here to see Dr Claudene back in 2018-2019, he was referred to a place that helped to fit him for a lift to help with his drop foot. He was wondering who that might have been? I tried to look through his OV notes but I was having trouble finding any info.  He said he thought it was on [address].  Do you know where that might would have been?

## 2024-02-21 ENCOUNTER — Other Ambulatory Visit: Payer: Self-pay

## 2024-02-21 MED ORDER — AMBULATORY NON FORMULARY MEDICATION: 1.0000 [IU] | Freq: Once | 0 refills | Status: AC

## 2024-02-21 NOTE — Telephone Encounter (Signed)
 Patient called back to follow up. I advised that we would be referring him to [facility]. He was appreciate and would like to move forward with that referral.

## 2024-04-16 ENCOUNTER — Ambulatory Visit (INDEPENDENT_AMBULATORY_CARE_PROVIDER_SITE_OTHER): Admitting: Internal Medicine

## 2024-04-16 ENCOUNTER — Encounter: Payer: Self-pay | Admitting: Internal Medicine

## 2024-04-16 ENCOUNTER — Telehealth: Payer: Self-pay | Admitting: Internal Medicine

## 2024-04-16 VITALS — BP 118/70 | HR 87 | Temp 98.5°F | Ht 73.0 in | Wt 286.0 lb

## 2024-04-16 DIAGNOSIS — Z7984 Long term (current) use of oral hypoglycemic drugs: Secondary | ICD-10-CM

## 2024-04-16 DIAGNOSIS — R809 Proteinuria, unspecified: Secondary | ICD-10-CM

## 2024-04-16 DIAGNOSIS — E119 Type 2 diabetes mellitus without complications: Secondary | ICD-10-CM | POA: Diagnosis not present

## 2024-04-16 DIAGNOSIS — S7411XS Injury of femoral nerve at hip and thigh level, right leg, sequela: Secondary | ICD-10-CM | POA: Diagnosis not present

## 2024-04-16 DIAGNOSIS — M545 Low back pain, unspecified: Secondary | ICD-10-CM

## 2024-04-16 LAB — URINALYSIS, ROUTINE W REFLEX MICROSCOPIC
Bilirubin Urine: NEGATIVE
Ketones, ur: NEGATIVE
Leukocytes,Ua: NEGATIVE
Nitrite: NEGATIVE
Specific Gravity, Urine: 1.02 (ref 1.000–1.030)
Total Protein, Urine: 30 — AB
Urine Glucose: NEGATIVE
Urobilinogen, UA: 0.2 (ref 0.0–1.0)
pH: 6 (ref 5.0–8.0)

## 2024-04-16 LAB — COMPREHENSIVE METABOLIC PANEL WITH GFR
ALT: 18 U/L (ref 0–53)
AST: 25 U/L (ref 0–37)
Albumin: 4.9 g/dL (ref 3.5–5.2)
Alkaline Phosphatase: 51 U/L (ref 39–117)
BUN: 25 mg/dL — ABNORMAL HIGH (ref 6–23)
CO2: 31 meq/L (ref 19–32)
Calcium: 10.2 mg/dL (ref 8.4–10.5)
Chloride: 97 meq/L (ref 96–112)
Creatinine, Ser: 1.67 mg/dL — ABNORMAL HIGH (ref 0.40–1.50)
GFR: 45.45 mL/min — ABNORMAL LOW (ref >60.00)
Glucose, Bld: 91 mg/dL (ref 70–99)
Potassium: 4.4 meq/L (ref 3.5–5.1)
Sodium: 137 meq/L (ref 135–145)
Total Bilirubin: 1.5 mg/dL — ABNORMAL HIGH (ref 0.2–1.2)
Total Protein: 8.7 g/dL — ABNORMAL HIGH (ref 6.0–8.3)

## 2024-04-16 LAB — HEMOGLOBIN A1C: Hgb A1c MFr Bld: 6.8 % — ABNORMAL HIGH (ref 4.6–6.5)

## 2024-04-16 LAB — MICROALBUMIN / CREATININE URINE RATIO
Creatinine,U: 132.4 mg/dL
Microalb Creat Ratio: 101.7 mg/g — ABNORMAL HIGH (ref 0.0–30.0)
Microalb, Ur: 13.5 mg/dL — ABNORMAL HIGH (ref 0.0–1.9)

## 2024-04-16 LAB — VITAMIN D 25 HYDROXY (VIT D DEFICIENCY, FRACTURES): VITD: 48.02 ng/mL (ref 30.00–100.00)

## 2024-04-16 MED ORDER — EMPAGLIFLOZIN 10 MG PO TABS: 10.0000 mg | ORAL_TABLET | Freq: Every day | ORAL | 11 refills | Status: AC

## 2024-04-16 NOTE — Assessment & Plan Note (Signed)
 Worse. Will re-start referral to physical therapy - [facility] - they need referral and latest office notes faxed to them at:  [phone]

## 2024-04-16 NOTE — Telephone Encounter (Signed)
 Copied from CRM [id number]. Topic: Referral - Question >> Apr 16, 2024  4:18 PM [place] E wrote: Reason for CRM: Patient called in regarding his physical therapy referral, stated this got sent to [facility] and they are no longer open. Patient questioning if this referral could be re-sent to [facility] instead. Callback number for patient is [phone].
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
## 2024-04-16 NOTE — Assessment & Plan Note (Addendum)
 Check urie microalbumin Not taking Losartan  Restart losartan  at 50 mg for kidney protection.  Start [name]  or Farxiga depending on the coverage

## 2024-04-19 LAB — PTH, INTACT AND CALCIUM
Calcium: 10.4 mg/dL — ABNORMAL HIGH (ref 8.6–10.3)
PTH: 32 pg/mL (ref 16–77)

## 2024-04-20 ENCOUNTER — Other Ambulatory Visit: Payer: Self-pay | Admitting: Internal Medicine

## 2024-04-20 DIAGNOSIS — R809 Proteinuria, unspecified: Secondary | ICD-10-CM

## 2024-04-20 MED ORDER — LOSARTAN POTASSIUM 50 MG PO TABS
50.0000 mg | ORAL_TABLET | Freq: Every day | ORAL | 3 refills | Status: AC
Start: 2024-04-20 — End: ?

## 2024-04-20 NOTE — Assessment & Plan Note (Signed)
 Restart losartan  at 50 mg for kidney protection.  Start [name]  or Farxiga depending on the coverage

## 2024-04-21 NOTE — Telephone Encounter (Signed)
 Will do. Thank you

## 2024-04-21 NOTE — Addendum Note (Signed)
 Addended by: Eboney Claybrook V on: 04/21/2024 07:42 AM   Modules accepted: Orders

## 2024-05-30 ENCOUNTER — Other Ambulatory Visit: Payer: Self-pay

## 2024-05-30 ENCOUNTER — Encounter (HOSPITAL_BASED_OUTPATIENT_CLINIC_OR_DEPARTMENT_OTHER): Payer: Self-pay | Admitting: Physical Therapy

## 2024-05-30 ENCOUNTER — Ambulatory Visit (HOSPITAL_BASED_OUTPATIENT_CLINIC_OR_DEPARTMENT_OTHER): Attending: Internal Medicine | Admitting: Physical Therapy

## 2024-05-30 DIAGNOSIS — M6281 Muscle weakness (generalized): Secondary | ICD-10-CM | POA: Insufficient documentation

## 2024-05-30 DIAGNOSIS — S7411XS Injury of femoral nerve at hip and thigh level, right leg, sequela: Secondary | ICD-10-CM | POA: Insufficient documentation

## 2024-05-30 DIAGNOSIS — M545 Low back pain, unspecified: Secondary | ICD-10-CM | POA: Diagnosis not present

## 2024-05-30 DIAGNOSIS — R269 Unspecified abnormalities of gait and mobility: Secondary | ICD-10-CM | POA: Diagnosis not present

## 2024-05-30 DIAGNOSIS — R29898 Other symptoms and signs involving the musculoskeletal system: Secondary | ICD-10-CM | POA: Insufficient documentation
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
## 2024-06-19 ENCOUNTER — Ambulatory Visit (HOSPITAL_BASED_OUTPATIENT_CLINIC_OR_DEPARTMENT_OTHER): Admitting: Physical Therapy

## 2024-06-19 ENCOUNTER — Encounter (HOSPITAL_BASED_OUTPATIENT_CLINIC_OR_DEPARTMENT_OTHER): Payer: Self-pay | Admitting: Physical Therapy

## 2024-06-19 DIAGNOSIS — R29898 Other symptoms and signs involving the musculoskeletal system: Secondary | ICD-10-CM | POA: Diagnosis not present

## 2024-06-19 DIAGNOSIS — R269 Unspecified abnormalities of gait and mobility: Secondary | ICD-10-CM

## 2024-06-19 DIAGNOSIS — S7411XS Injury of femoral nerve at hip and thigh level, right leg, sequela: Secondary | ICD-10-CM | POA: Diagnosis not present

## 2024-06-19 DIAGNOSIS — M545 Low back pain, unspecified: Secondary | ICD-10-CM | POA: Diagnosis not present

## 2024-06-19 DIAGNOSIS — M6281 Muscle weakness (generalized): Secondary | ICD-10-CM | POA: Diagnosis not present
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
## 2024-06-24 ENCOUNTER — Ambulatory Visit (HOSPITAL_BASED_OUTPATIENT_CLINIC_OR_DEPARTMENT_OTHER): Admitting: Physical Therapy

## 2024-06-25 ENCOUNTER — Encounter (HOSPITAL_BASED_OUTPATIENT_CLINIC_OR_DEPARTMENT_OTHER): Payer: Self-pay | Admitting: Physical Therapy

## 2024-06-26 ENCOUNTER — Encounter (HOSPITAL_BASED_OUTPATIENT_CLINIC_OR_DEPARTMENT_OTHER): Admitting: Physical Therapy

## 2024-07-01 ENCOUNTER — Encounter (HOSPITAL_BASED_OUTPATIENT_CLINIC_OR_DEPARTMENT_OTHER): Admitting: Physical Therapy

## 2024-07-03 ENCOUNTER — Ambulatory Visit (HOSPITAL_BASED_OUTPATIENT_CLINIC_OR_DEPARTMENT_OTHER): Admitting: Physical Therapy

## 2024-07-08 ENCOUNTER — Encounter (HOSPITAL_BASED_OUTPATIENT_CLINIC_OR_DEPARTMENT_OTHER): Admitting: Physical Therapy

## 2024-07-08 DIAGNOSIS — E1122 Type 2 diabetes mellitus with diabetic chronic kidney disease: Secondary | ICD-10-CM | POA: Diagnosis not present

## 2024-07-08 DIAGNOSIS — N1831 Chronic kidney disease, stage 3a: Secondary | ICD-10-CM | POA: Diagnosis not present

## 2024-07-08 DIAGNOSIS — R809 Proteinuria, unspecified: Secondary | ICD-10-CM | POA: Diagnosis not present

## 2024-07-08 DIAGNOSIS — I129 Hypertensive chronic kidney disease with stage 1 through stage 4 chronic kidney disease, or unspecified chronic kidney disease: Secondary | ICD-10-CM | POA: Diagnosis not present

## 2024-07-08 LAB — LAB REPORT - SCANNED
Albumin, Urine POC: 287.2
Creatinine, POC: 138.1 mg/dL
Microalb Creat Ratio: 208
eGFR: 46

## 2024-07-10 ENCOUNTER — Encounter (HOSPITAL_BASED_OUTPATIENT_CLINIC_OR_DEPARTMENT_OTHER): Payer: Self-pay | Admitting: Physical Therapy

## 2024-07-10 ENCOUNTER — Ambulatory Visit (HOSPITAL_BASED_OUTPATIENT_CLINIC_OR_DEPARTMENT_OTHER): Attending: Internal Medicine | Admitting: Physical Therapy

## 2024-07-10 DIAGNOSIS — R269 Unspecified abnormalities of gait and mobility: Secondary | ICD-10-CM | POA: Diagnosis not present

## 2024-07-10 DIAGNOSIS — M6281 Muscle weakness (generalized): Secondary | ICD-10-CM | POA: Insufficient documentation

## 2024-07-10 DIAGNOSIS — R29898 Other symptoms and signs involving the musculoskeletal system: Secondary | ICD-10-CM | POA: Insufficient documentation
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
## 2024-07-15 ENCOUNTER — Encounter (HOSPITAL_BASED_OUTPATIENT_CLINIC_OR_DEPARTMENT_OTHER): Admitting: Physical Therapy

## 2024-07-17 ENCOUNTER — Encounter (HOSPITAL_BASED_OUTPATIENT_CLINIC_OR_DEPARTMENT_OTHER): Admitting: Physical Therapy

## 2024-07-31 ENCOUNTER — Encounter (HOSPITAL_BASED_OUTPATIENT_CLINIC_OR_DEPARTMENT_OTHER): Admitting: Physical Therapy

## 2024-08-07 ENCOUNTER — Encounter (HOSPITAL_BASED_OUTPATIENT_CLINIC_OR_DEPARTMENT_OTHER): Admitting: Physical Therapy

## 2024-08-13 ENCOUNTER — Ambulatory Visit (HOSPITAL_BASED_OUTPATIENT_CLINIC_OR_DEPARTMENT_OTHER): Payer: Self-pay | Attending: Internal Medicine | Admitting: Physical Therapy

## 2024-08-13 ENCOUNTER — Encounter (HOSPITAL_BASED_OUTPATIENT_CLINIC_OR_DEPARTMENT_OTHER): Payer: Self-pay | Admitting: Physical Therapy

## 2024-08-13 DIAGNOSIS — M6281 Muscle weakness (generalized): Secondary | ICD-10-CM | POA: Insufficient documentation

## 2024-08-13 DIAGNOSIS — R269 Unspecified abnormalities of gait and mobility: Secondary | ICD-10-CM | POA: Diagnosis present

## 2024-08-13 DIAGNOSIS — R29898 Other symptoms and signs involving the musculoskeletal system: Secondary | ICD-10-CM | POA: Diagnosis present
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
## 2024-08-14 ENCOUNTER — Ambulatory Visit: Admitting: Internal Medicine

## 2024-08-18 ENCOUNTER — Encounter (HOSPITAL_BASED_OUTPATIENT_CLINIC_OR_DEPARTMENT_OTHER): Admitting: Physical Therapy
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
## 2024-08-25 ENCOUNTER — Encounter (HOSPITAL_BASED_OUTPATIENT_CLINIC_OR_DEPARTMENT_OTHER): Admitting: Physical Therapy

## 2024-08-26 ENCOUNTER — Encounter (HOSPITAL_BASED_OUTPATIENT_CLINIC_OR_DEPARTMENT_OTHER): Admitting: Physical Therapy

## 2024-08-30 ENCOUNTER — Ambulatory Visit (HOSPITAL_BASED_OUTPATIENT_CLINIC_OR_DEPARTMENT_OTHER): Admitting: Rehabilitative and Restorative Service Providers"

## 2024-09-10 ENCOUNTER — Encounter (HOSPITAL_BASED_OUTPATIENT_CLINIC_OR_DEPARTMENT_OTHER): Admitting: Physical Therapy

## 2024-10-01 ENCOUNTER — Ambulatory Visit: Admitting: Internal Medicine

## 2024-10-08 ENCOUNTER — Encounter (HOSPITAL_BASED_OUTPATIENT_CLINIC_OR_DEPARTMENT_OTHER): Payer: Self-pay | Admitting: Physical Therapy

## 2024-10-10 ENCOUNTER — Encounter (HOSPITAL_BASED_OUTPATIENT_CLINIC_OR_DEPARTMENT_OTHER): Payer: Self-pay

## 2024-10-15 ENCOUNTER — Encounter (HOSPITAL_BASED_OUTPATIENT_CLINIC_OR_DEPARTMENT_OTHER): Payer: Self-pay | Admitting: Physical Therapy

## 2024-10-22 ENCOUNTER — Encounter (HOSPITAL_BASED_OUTPATIENT_CLINIC_OR_DEPARTMENT_OTHER): Payer: Self-pay | Admitting: Physical Therapy

## 2024-10-30 ENCOUNTER — Encounter (HOSPITAL_BASED_OUTPATIENT_CLINIC_OR_DEPARTMENT_OTHER): Payer: Self-pay | Admitting: Physical Therapy

## 2024-11-06 ENCOUNTER — Encounter (HOSPITAL_BASED_OUTPATIENT_CLINIC_OR_DEPARTMENT_OTHER): Payer: Self-pay | Admitting: Physical Therapy

## 2024-11-11 ENCOUNTER — Ambulatory Visit: Admitting: Internal Medicine
